# Patient Record
Sex: Female | Born: 1940 | Race: White | Hispanic: No | Marital: Married | Smoking: Never smoker
Health system: Southern US, Community
[De-identification: ages and names within clinical notes are randomized; demographics above are authoritative.]

## PROBLEM LIST (undated history)

## (undated) DIAGNOSIS — E119 Type 2 diabetes mellitus without complications: Secondary | ICD-10-CM

## (undated) DIAGNOSIS — C189 Malignant neoplasm of colon, unspecified: Secondary | ICD-10-CM

## (undated) DIAGNOSIS — B029 Zoster without complications: Secondary | ICD-10-CM

## (undated) DIAGNOSIS — J029 Acute pharyngitis, unspecified: Secondary | ICD-10-CM

## (undated) DIAGNOSIS — N390 Urinary tract infection, site not specified: Secondary | ICD-10-CM

## (undated) DIAGNOSIS — R110 Nausea: Secondary | ICD-10-CM

## (undated) DIAGNOSIS — Z95828 Presence of other vascular implants and grafts: Secondary | ICD-10-CM

## (undated) DIAGNOSIS — E079 Disorder of thyroid, unspecified: Secondary | ICD-10-CM

## (undated) DIAGNOSIS — I10 Essential (primary) hypertension: Secondary | ICD-10-CM

## (undated) HISTORY — DX: Urinary tract infection, site not specified: N39.0

## (undated) HISTORY — DX: Nausea: R11.0

## (undated) HISTORY — PX: PORT-A-CATH REMOVAL: SHX5289

## (undated) HISTORY — PX: COLECTOMY: SHX59

## (undated) HISTORY — PX: OTHER SURGICAL HISTORY: SHX169

## (undated) HISTORY — PX: APPENDECTOMY: SHX54

## (undated) HISTORY — DX: Type 2 diabetes mellitus without complications: E11.9

## (undated) HISTORY — DX: Essential (primary) hypertension: I10

## (undated) HISTORY — PX: RESECTION LIVER PARTIAL / TOTAL W/ RADIOFREQUENCY ABLATION: SUR1245

## (undated) HISTORY — DX: Acute pharyngitis, unspecified: J02.9

## (undated) HISTORY — DX: Zoster without complications: B02.9

## (undated) HISTORY — DX: Presence of other vascular implants and grafts: Z95.828

## (undated) HISTORY — DX: Disorder of thyroid, unspecified: E07.9

---

## 2004-01-22 ENCOUNTER — Ambulatory Visit: Payer: Self-pay | Admitting: Family Medicine

## 2004-04-28 ENCOUNTER — Ambulatory Visit: Payer: Self-pay | Admitting: Family Medicine

## 2004-11-03 ENCOUNTER — Ambulatory Visit: Payer: Self-pay | Admitting: Family Medicine

## 2005-01-02 HISTORY — PX: OOPHORECTOMY: SHX86

## 2005-02-09 ENCOUNTER — Ambulatory Visit: Payer: Self-pay | Admitting: Family Medicine

## 2005-02-17 ENCOUNTER — Ambulatory Visit: Payer: Self-pay | Admitting: Family Medicine

## 2005-05-16 ENCOUNTER — Ambulatory Visit: Payer: Self-pay | Admitting: Family Medicine

## 2005-07-06 ENCOUNTER — Ambulatory Visit: Payer: Self-pay | Admitting: Gastroenterology

## 2005-07-19 ENCOUNTER — Encounter: Payer: Self-pay | Admitting: Gastroenterology

## 2005-07-19 ENCOUNTER — Ambulatory Visit: Payer: Self-pay | Admitting: Gastroenterology

## 2005-08-04 ENCOUNTER — Encounter: Admission: RE | Admit: 2005-08-04 | Discharge: 2005-08-04 | Payer: Self-pay | Admitting: Surgery

## 2005-08-16 ENCOUNTER — Ambulatory Visit: Payer: Self-pay | Admitting: Family Medicine

## 2005-08-18 ENCOUNTER — Encounter: Admission: RE | Admit: 2005-08-18 | Discharge: 2005-08-18 | Payer: Self-pay | Admitting: Surgery

## 2005-09-13 ENCOUNTER — Encounter (INDEPENDENT_AMBULATORY_CARE_PROVIDER_SITE_OTHER): Payer: Self-pay | Admitting: Specialist

## 2005-09-13 ENCOUNTER — Inpatient Hospital Stay (HOSPITAL_COMMUNITY): Admission: RE | Admit: 2005-09-13 | Discharge: 2005-09-19 | Payer: Self-pay | Admitting: Surgery

## 2005-09-15 ENCOUNTER — Ambulatory Visit: Payer: Self-pay | Admitting: Hematology and Oncology

## 2005-09-26 ENCOUNTER — Ambulatory Visit: Payer: Self-pay | Admitting: Hematology and Oncology

## 2005-10-16 ENCOUNTER — Ambulatory Visit: Payer: Self-pay | Admitting: Family Medicine

## 2005-10-18 ENCOUNTER — Ambulatory Visit (HOSPITAL_COMMUNITY): Payer: Self-pay | Admitting: Oncology

## 2005-10-18 ENCOUNTER — Encounter (HOSPITAL_COMMUNITY): Admission: RE | Admit: 2005-10-18 | Discharge: 2005-11-17 | Payer: Self-pay | Admitting: Oncology

## 2005-10-18 ENCOUNTER — Encounter: Admission: RE | Admit: 2005-10-18 | Discharge: 2005-10-18 | Payer: Self-pay | Admitting: Oncology

## 2005-10-25 ENCOUNTER — Ambulatory Visit: Admission: RE | Admit: 2005-10-25 | Discharge: 2005-12-18 | Payer: Self-pay | Admitting: Radiation Oncology

## 2005-11-03 ENCOUNTER — Ambulatory Visit: Payer: Self-pay | Admitting: Family Medicine

## 2005-11-21 ENCOUNTER — Encounter (HOSPITAL_COMMUNITY): Admission: RE | Admit: 2005-11-21 | Discharge: 2005-12-21 | Payer: Self-pay | Admitting: Oncology

## 2005-11-21 ENCOUNTER — Ambulatory Visit: Payer: Self-pay | Admitting: Oncology

## 2005-12-07 ENCOUNTER — Ambulatory Visit (HOSPITAL_COMMUNITY): Payer: Self-pay | Admitting: Oncology

## 2006-01-16 ENCOUNTER — Ambulatory Visit: Payer: Self-pay | Admitting: Family Medicine

## 2006-01-17 ENCOUNTER — Encounter (HOSPITAL_COMMUNITY): Admission: RE | Admit: 2006-01-17 | Discharge: 2006-02-16 | Payer: Self-pay | Admitting: Oncology

## 2006-04-16 ENCOUNTER — Encounter (HOSPITAL_COMMUNITY): Admission: RE | Admit: 2006-04-16 | Discharge: 2006-05-16 | Payer: Self-pay | Admitting: Oncology

## 2006-04-16 ENCOUNTER — Ambulatory Visit (HOSPITAL_COMMUNITY): Payer: Self-pay | Admitting: Oncology

## 2006-04-24 ENCOUNTER — Ambulatory Visit: Payer: Self-pay | Admitting: Family Medicine

## 2006-10-15 ENCOUNTER — Ambulatory Visit: Payer: Self-pay | Admitting: Gastroenterology

## 2006-10-17 ENCOUNTER — Ambulatory Visit (HOSPITAL_COMMUNITY): Payer: Self-pay | Admitting: Oncology

## 2006-10-29 ENCOUNTER — Ambulatory Visit: Payer: Self-pay | Admitting: Gastroenterology

## 2007-04-24 ENCOUNTER — Ambulatory Visit (HOSPITAL_COMMUNITY): Payer: Self-pay | Admitting: Oncology

## 2007-08-07 ENCOUNTER — Encounter (HOSPITAL_COMMUNITY): Admission: RE | Admit: 2007-08-07 | Discharge: 2007-09-06 | Payer: Self-pay | Admitting: Oncology

## 2007-08-07 ENCOUNTER — Ambulatory Visit (HOSPITAL_COMMUNITY): Payer: Self-pay | Admitting: Oncology

## 2007-08-12 ENCOUNTER — Encounter: Payer: Self-pay | Admitting: Gastroenterology

## 2007-08-15 ENCOUNTER — Ambulatory Visit (HOSPITAL_COMMUNITY): Admission: RE | Admit: 2007-08-15 | Discharge: 2007-08-15 | Payer: Self-pay | Admitting: Oncology

## 2007-08-20 ENCOUNTER — Encounter: Payer: Self-pay | Admitting: Gastroenterology

## 2007-08-28 ENCOUNTER — Encounter: Admission: RE | Admit: 2007-08-28 | Discharge: 2007-08-28 | Payer: Self-pay | Admitting: Oncology

## 2007-09-27 ENCOUNTER — Ambulatory Visit (HOSPITAL_COMMUNITY): Admission: RE | Admit: 2007-09-27 | Discharge: 2007-09-28 | Payer: Self-pay | Admitting: Interventional Radiology

## 2007-10-08 ENCOUNTER — Ambulatory Visit (HOSPITAL_COMMUNITY): Payer: Self-pay | Admitting: Oncology

## 2007-10-08 ENCOUNTER — Encounter: Payer: Self-pay | Admitting: Gastroenterology

## 2007-10-08 ENCOUNTER — Encounter (HOSPITAL_COMMUNITY): Admission: RE | Admit: 2007-10-08 | Discharge: 2007-11-07 | Payer: Self-pay | Admitting: Oncology

## 2007-10-22 ENCOUNTER — Ambulatory Visit (HOSPITAL_COMMUNITY): Admission: RE | Admit: 2007-10-22 | Discharge: 2007-10-22 | Payer: Self-pay | Admitting: Surgery

## 2007-10-29 ENCOUNTER — Encounter: Admission: RE | Admit: 2007-10-29 | Discharge: 2007-10-29 | Payer: Self-pay | Admitting: Interventional Radiology

## 2007-11-05 ENCOUNTER — Encounter: Payer: Self-pay | Admitting: Gastroenterology

## 2007-11-13 ENCOUNTER — Encounter (HOSPITAL_COMMUNITY): Admission: RE | Admit: 2007-11-13 | Discharge: 2007-12-13 | Payer: Self-pay | Admitting: Oncology

## 2007-11-18 ENCOUNTER — Encounter: Payer: Self-pay | Admitting: Gastroenterology

## 2007-11-29 ENCOUNTER — Emergency Department (HOSPITAL_COMMUNITY): Admission: EM | Admit: 2007-11-29 | Discharge: 2007-11-29 | Payer: Self-pay | Admitting: Emergency Medicine

## 2007-12-02 ENCOUNTER — Ambulatory Visit (HOSPITAL_COMMUNITY): Payer: Self-pay | Admitting: Oncology

## 2007-12-04 ENCOUNTER — Inpatient Hospital Stay (HOSPITAL_COMMUNITY): Admission: EM | Admit: 2007-12-04 | Discharge: 2007-12-09 | Payer: Self-pay | Admitting: Emergency Medicine

## 2007-12-05 ENCOUNTER — Ambulatory Visit: Payer: Self-pay | Admitting: Gastroenterology

## 2007-12-06 ENCOUNTER — Ambulatory Visit: Payer: Self-pay | Admitting: Oncology

## 2007-12-07 ENCOUNTER — Ambulatory Visit: Payer: Self-pay | Admitting: Gastroenterology

## 2007-12-09 ENCOUNTER — Ambulatory Visit: Payer: Self-pay | Admitting: Internal Medicine

## 2007-12-20 ENCOUNTER — Encounter (HOSPITAL_COMMUNITY): Admission: RE | Admit: 2007-12-20 | Discharge: 2008-01-19 | Payer: Self-pay | Admitting: Oncology

## 2008-01-14 ENCOUNTER — Encounter: Payer: Self-pay | Admitting: Gastroenterology

## 2008-01-15 ENCOUNTER — Other Ambulatory Visit: Admission: RE | Admit: 2008-01-15 | Discharge: 2008-01-15 | Payer: Self-pay | Admitting: Oncology

## 2008-01-20 ENCOUNTER — Encounter (HOSPITAL_COMMUNITY): Admission: RE | Admit: 2008-01-20 | Discharge: 2008-02-19 | Payer: Self-pay | Admitting: Oncology

## 2008-01-28 ENCOUNTER — Ambulatory Visit (HOSPITAL_COMMUNITY): Payer: Self-pay | Admitting: Oncology

## 2008-02-18 ENCOUNTER — Encounter: Admission: RE | Admit: 2008-02-18 | Discharge: 2008-02-18 | Payer: Self-pay | Admitting: Interventional Radiology

## 2008-02-24 ENCOUNTER — Encounter: Payer: Self-pay | Admitting: Gastroenterology

## 2008-02-24 ENCOUNTER — Encounter (HOSPITAL_COMMUNITY): Admission: RE | Admit: 2008-02-24 | Discharge: 2008-03-25 | Payer: Self-pay | Admitting: Oncology

## 2008-03-24 ENCOUNTER — Encounter (HOSPITAL_COMMUNITY): Admission: RE | Admit: 2008-03-24 | Discharge: 2008-04-23 | Payer: Self-pay | Admitting: Oncology

## 2008-03-24 ENCOUNTER — Ambulatory Visit (HOSPITAL_COMMUNITY): Payer: Self-pay | Admitting: Oncology

## 2008-05-06 ENCOUNTER — Ambulatory Visit (HOSPITAL_COMMUNITY): Admission: RE | Admit: 2008-05-06 | Discharge: 2008-05-06 | Payer: Self-pay | Admitting: Oncology

## 2008-05-08 ENCOUNTER — Encounter: Payer: Self-pay | Admitting: Gastroenterology

## 2008-05-08 ENCOUNTER — Encounter (HOSPITAL_COMMUNITY): Admission: RE | Admit: 2008-05-08 | Discharge: 2008-06-07 | Payer: Self-pay | Admitting: Oncology

## 2008-05-19 ENCOUNTER — Encounter: Admission: RE | Admit: 2008-05-19 | Discharge: 2008-05-19 | Payer: Self-pay | Admitting: Interventional Radiology

## 2008-06-02 ENCOUNTER — Ambulatory Visit (HOSPITAL_COMMUNITY): Payer: Self-pay | Admitting: Oncology

## 2008-07-14 ENCOUNTER — Encounter (HOSPITAL_COMMUNITY): Admission: RE | Admit: 2008-07-14 | Discharge: 2008-08-13 | Payer: Self-pay | Admitting: Oncology

## 2008-08-25 ENCOUNTER — Ambulatory Visit (HOSPITAL_COMMUNITY): Payer: Self-pay | Admitting: Oncology

## 2008-08-25 ENCOUNTER — Encounter (HOSPITAL_COMMUNITY): Admission: RE | Admit: 2008-08-25 | Discharge: 2008-09-24 | Payer: Self-pay | Admitting: Oncology

## 2008-09-08 ENCOUNTER — Encounter: Admission: RE | Admit: 2008-09-08 | Discharge: 2008-09-08 | Payer: Self-pay | Admitting: Oncology

## 2008-09-09 ENCOUNTER — Encounter: Payer: Self-pay | Admitting: Gastroenterology

## 2008-09-23 ENCOUNTER — Encounter (INDEPENDENT_AMBULATORY_CARE_PROVIDER_SITE_OTHER): Payer: Self-pay | Admitting: *Deleted

## 2008-10-06 ENCOUNTER — Encounter (HOSPITAL_COMMUNITY): Admission: RE | Admit: 2008-10-06 | Discharge: 2008-11-05 | Payer: Self-pay | Admitting: Oncology

## 2008-11-17 ENCOUNTER — Encounter (HOSPITAL_COMMUNITY): Admission: RE | Admit: 2008-11-17 | Discharge: 2008-12-17 | Payer: Self-pay | Admitting: Oncology

## 2008-11-17 ENCOUNTER — Encounter (INDEPENDENT_AMBULATORY_CARE_PROVIDER_SITE_OTHER): Payer: Self-pay | Admitting: *Deleted

## 2008-11-17 ENCOUNTER — Ambulatory Visit (HOSPITAL_COMMUNITY): Payer: Self-pay | Admitting: Oncology

## 2008-12-11 ENCOUNTER — Encounter (INDEPENDENT_AMBULATORY_CARE_PROVIDER_SITE_OTHER): Payer: Self-pay | Admitting: *Deleted

## 2008-12-14 ENCOUNTER — Ambulatory Visit: Payer: Self-pay | Admitting: Gastroenterology

## 2008-12-14 ENCOUNTER — Encounter (INDEPENDENT_AMBULATORY_CARE_PROVIDER_SITE_OTHER): Payer: Self-pay | Admitting: *Deleted

## 2008-12-21 ENCOUNTER — Telehealth: Payer: Self-pay | Admitting: Gastroenterology

## 2008-12-24 ENCOUNTER — Telehealth: Payer: Self-pay | Admitting: Gastroenterology

## 2008-12-28 ENCOUNTER — Encounter (HOSPITAL_COMMUNITY): Admission: RE | Admit: 2008-12-28 | Discharge: 2009-01-27 | Payer: Self-pay | Admitting: Oncology

## 2008-12-30 ENCOUNTER — Ambulatory Visit: Payer: Self-pay | Admitting: Gastroenterology

## 2009-01-04 ENCOUNTER — Encounter: Payer: Self-pay | Admitting: Gastroenterology

## 2009-02-08 ENCOUNTER — Encounter (HOSPITAL_COMMUNITY): Admission: RE | Admit: 2009-02-08 | Discharge: 2009-03-10 | Payer: Self-pay | Admitting: Oncology

## 2009-02-08 ENCOUNTER — Ambulatory Visit (HOSPITAL_COMMUNITY): Payer: Self-pay | Admitting: Oncology

## 2009-03-03 ENCOUNTER — Encounter: Admission: RE | Admit: 2009-03-03 | Discharge: 2009-03-03 | Payer: Self-pay | Admitting: Oncology

## 2009-03-09 ENCOUNTER — Encounter (HOSPITAL_COMMUNITY): Admission: RE | Admit: 2009-03-09 | Discharge: 2009-04-08 | Payer: Self-pay | Admitting: Oncology

## 2009-03-17 ENCOUNTER — Encounter: Payer: Self-pay | Admitting: Gastroenterology

## 2009-04-28 ENCOUNTER — Encounter (HOSPITAL_COMMUNITY): Admission: RE | Admit: 2009-04-28 | Discharge: 2009-05-28 | Payer: Self-pay | Admitting: Oncology

## 2009-04-28 ENCOUNTER — Ambulatory Visit (HOSPITAL_COMMUNITY): Payer: Self-pay | Admitting: Oncology

## 2009-06-02 ENCOUNTER — Encounter (HOSPITAL_COMMUNITY): Admission: RE | Admit: 2009-06-02 | Discharge: 2009-07-02 | Payer: Self-pay | Admitting: Oncology

## 2009-07-14 ENCOUNTER — Encounter (HOSPITAL_COMMUNITY): Admission: RE | Admit: 2009-07-14 | Discharge: 2009-08-13 | Payer: Self-pay | Admitting: Oncology

## 2009-07-14 ENCOUNTER — Ambulatory Visit (HOSPITAL_COMMUNITY): Payer: Self-pay | Admitting: Oncology

## 2009-08-25 ENCOUNTER — Encounter (HOSPITAL_COMMUNITY): Admission: RE | Admit: 2009-08-25 | Discharge: 2009-09-24 | Payer: Self-pay | Admitting: Oncology

## 2009-09-29 ENCOUNTER — Encounter: Admission: RE | Admit: 2009-09-29 | Discharge: 2009-09-29 | Payer: Self-pay | Admitting: Oncology

## 2009-10-11 ENCOUNTER — Ambulatory Visit (HOSPITAL_COMMUNITY): Payer: Self-pay | Admitting: Oncology

## 2009-10-11 ENCOUNTER — Encounter (HOSPITAL_COMMUNITY)
Admission: RE | Admit: 2009-10-11 | Discharge: 2009-11-10 | Payer: Self-pay | Source: Home / Self Care | Admitting: Oncology

## 2009-10-12 ENCOUNTER — Encounter: Payer: Self-pay | Admitting: Gastroenterology

## 2009-11-24 ENCOUNTER — Encounter (HOSPITAL_COMMUNITY)
Admission: RE | Admit: 2009-11-24 | Discharge: 2009-12-24 | Payer: Self-pay | Source: Home / Self Care | Attending: Oncology | Admitting: Oncology

## 2009-12-02 IMAGING — CT CT GUIDANCE TISSUE ABLATION
2 of 18 series · 6 of 32 positions shown, 10 images · non-contrast
Comparison: none

EXAMINATION:

CT GUIDED RADIOFREQUENCY ABLATION OF RIGHT LIVER COLORECTAL
METASTASES (2)
CLINICAL DATA: 66-year-old female history of colon carcinoma,
surveillance CT and PET CT imaging demonstrated 2 hypermetabolic
right hepatic lesions consistent with metastases.

[Series 2: rfa 5.0 b40f st · axial · 0.83mm/px · z∈[-234,-174]mm · 3 of 24 slices shown, 7 images (1 of 2)]
[im 6/24  soft-tissue]
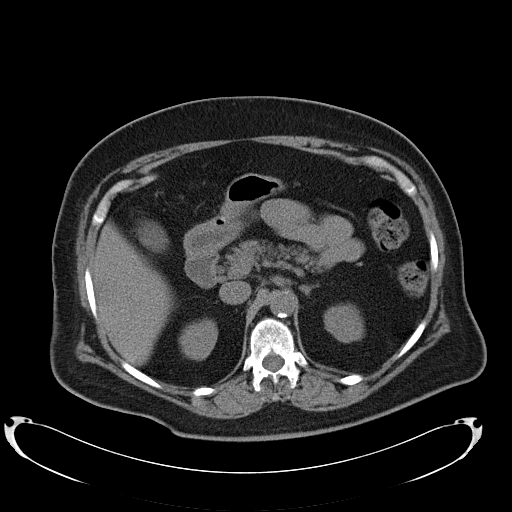
[im 6/24  lung]
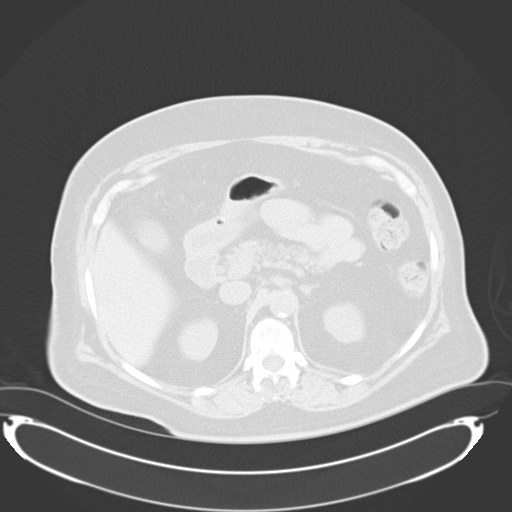
[im 6/24  bone]
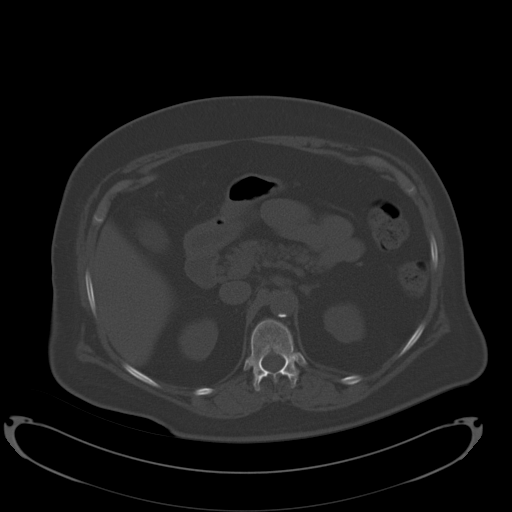
[im 12/24  soft-tissue]
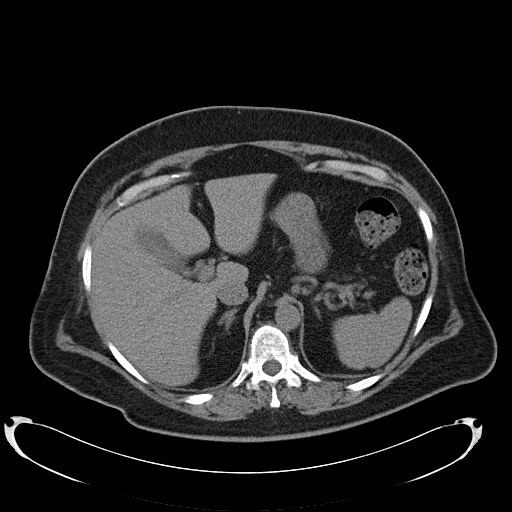
[im 12/24  lung]
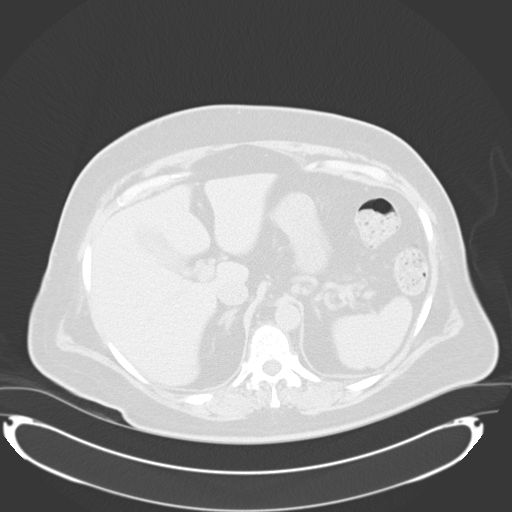
[im 18/24  soft-tissue]
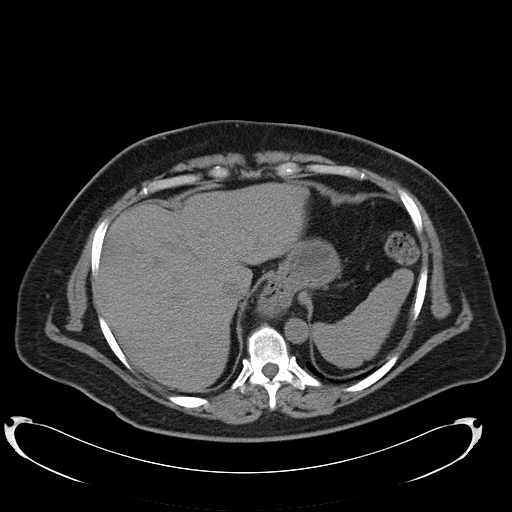
[im 18/24  lung]
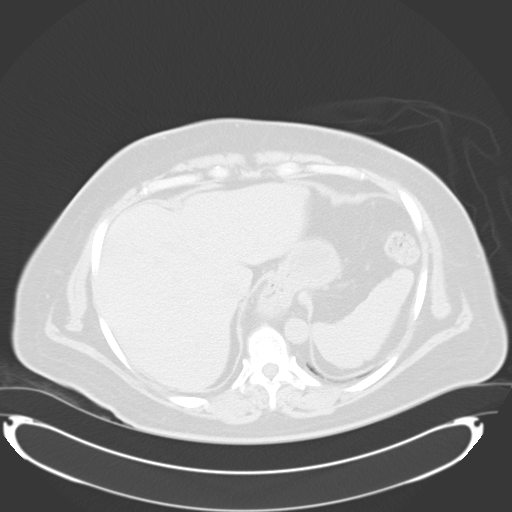

[Series 19: rfa 5.0 b40f st · axial · 0.83mm/px · z∈[-202,-132]mm · 3 of 28 slices shown (2 of 2)]
[im 7/28  soft-tissue]
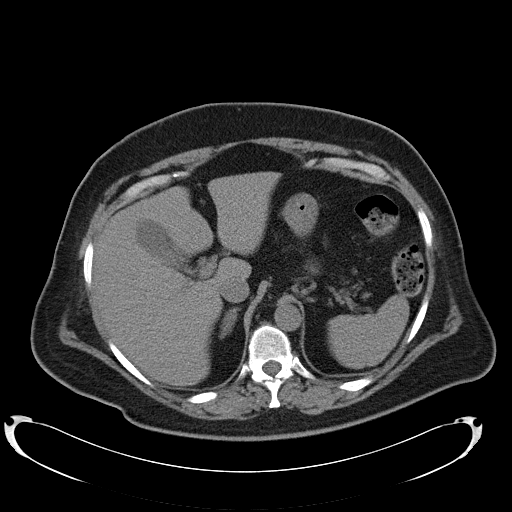
[im 14/28  soft-tissue]
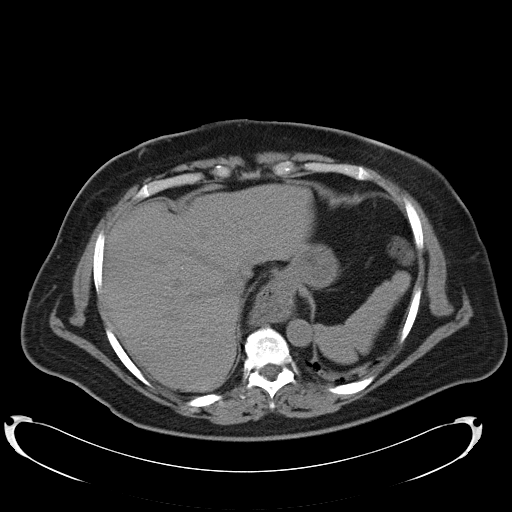
[im 21/28  soft-tissue]
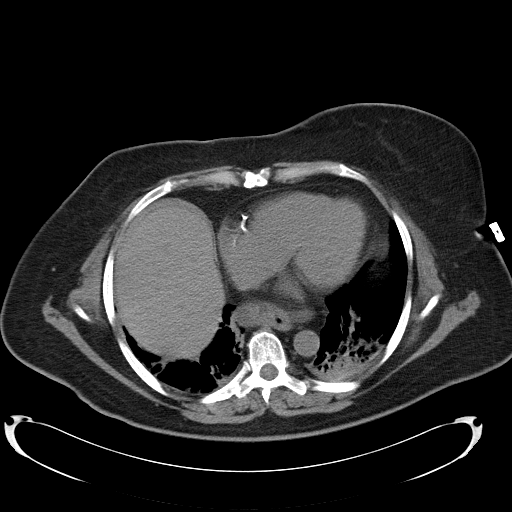

[6 of 32 positions shown; findings below may reference images not displayed]

Date:09/27/2007 [DATE]

Radiologist:BONDOC.Guerra, M.D.

Medications:General anesthesia

Guidance:CT

Complications:No immediate

PROCEDURE/FINDINGS:

Informed consent was obtained from the patient following
explanation of the procedure, risks, benefits and alternatives.
The patient understands, agrees and consents for the procedure.
All questions were addressed.  A time out was performed.

After induction of general anesthesia, the patient was positioned
supine.  Noncontrast CT localization was performed through the
liver.  The two right hepatic hypodense liver lesions were
identified and correlated with the previous CT from 08/14/2088.

From an anterior intercostal approach, 2 20 cm length [employer] RFA
probes with 3 cm treatment tips were advanced across normal liver
parenchyma into the two right liver lesions.  Needle positions
where confirmed across the 2 right liver lesions with serial CT
exams during needle advancement.

Once the needles were confirmed across the two lesions,
radiofrequency ablation treatment was performed simultaneously of
the 2 lesions.  Initially a 16-minute cycle was performed followed
by an additional 4 minute cycle.  After the 20-minute treatment,
both probes demonstrated appropriate temperature measurements,
greater than 60 degrees Celsius.  The needles were retracted in the
cautery mode  and removed.  No immediate complication.  The patient
tolerated procedure well.

She was extubated and stable for recovery in the PACU.
IMPRESSION: CT guided radiofrequency ablation of 2 right liver
colorectal metastases.

## 2010-01-05 ENCOUNTER — Encounter (HOSPITAL_COMMUNITY)
Admission: RE | Admit: 2010-01-05 | Discharge: 2010-02-01 | Payer: Self-pay | Source: Home / Self Care | Attending: Oncology | Admitting: Oncology

## 2010-01-05 ENCOUNTER — Ambulatory Visit (HOSPITAL_COMMUNITY)
Admission: RE | Admit: 2010-01-05 | Discharge: 2010-02-01 | Payer: Self-pay | Source: Home / Self Care | Attending: Oncology | Admitting: Oncology

## 2010-01-05 LAB — CBC
HCT: 38.3 % (ref 36.0–46.0)
Hemoglobin: 12.9 g/dL (ref 12.0–15.0)
MCH: 31.5 pg (ref 26.0–34.0)
MCHC: 33.7 g/dL (ref 30.0–36.0)
MCV: 93.4 fL (ref 78.0–100.0)
Platelets: 313 10*3/uL (ref 150–400)
RBC: 4.1 MIL/uL (ref 3.87–5.11)
RDW: 12.8 % (ref 11.5–15.5)
WBC: 8.4 10*3/uL (ref 4.0–10.5)

## 2010-01-05 LAB — BASIC METABOLIC PANEL
BUN: 7 mg/dL (ref 6–23)
CO2: 28 mEq/L (ref 19–32)
Calcium: 9.6 mg/dL (ref 8.4–10.5)
Chloride: 102 mEq/L (ref 96–112)
Creatinine, Ser: 0.75 mg/dL (ref 0.4–1.2)
GFR calc Af Amer: >60 mL/min (ref >60)
GFR calc non Af Amer: >60 mL/min (ref >60)
Glucose, Bld: 108 mg/dL — ABNORMAL HIGH (ref 70–99)
Potassium: 4.2 mEq/L (ref 3.5–5.1)
Sodium: 139 mEq/L (ref 135–145)

## 2010-01-05 LAB — CEA: CEA: 4.3 ng/mL (ref 0.0–5.0)

## 2010-01-07 ENCOUNTER — Encounter: Payer: Self-pay | Admitting: Gastroenterology

## 2010-01-23 ENCOUNTER — Encounter (HOSPITAL_COMMUNITY): Payer: Self-pay | Admitting: Oncology

## 2010-01-26 ENCOUNTER — Encounter
Admission: RE | Admit: 2010-01-26 | Discharge: 2010-01-26 | Payer: Self-pay | Source: Home / Self Care | Attending: Oncology | Admitting: Oncology

## 2010-02-01 NOTE — Letter (Signed)
Summary: [facility]  [facility]   Imported By: Sherian Rein 11/08/2009 07:42:22  _____________________________________________________________________  External Attachment:    Type:   Image     Comment:   External Document

## 2010-02-01 NOTE — Letter (Signed)
Summary: Patient Notice- Colon Biospy Results  [facility]  [address] [place], [place] [address]   Phone: [phone]  Fax: [phone]        January 04, 2009 MRN: [id number]    [facility] [place] [address] [place], [place]  [address]    Dear Ms. Alcock,  I am pleased to inform you that the biopsies taken during your recent colonoscopy did not show any evidence of cancer upon pathologic examination.FOLLOWUP EXAM IN 3 YEARS DEPENDING ON CLINICAL STATUS.  Additional information/recommendations:  X__No further action is needed at this time.  Please follow-up with      your primary care physician for your other healthcare needs.  __Please call [phone] to schedule a return visit to review      your condition.  __Continue with the treatment plan as outlined on the day of your      exam.  __You should have a repeat colonoscopy examination for this problem           in _ years.  Please call us if you are having persistent problems or have questions about your condition that have not been fully answered at this time.  Sincerely,  Mardella Layman MD [facility]   This letter has been electronically signed by your physician.  Appended Document: Patient Notice- Colon Biospy Results Letter mailed 1.5.11

## 2010-02-01 NOTE — Letter (Signed)
Summary: [facility]  [facility]   Imported By: Lester Hamilton 04/21/2009 11:14:39  _____________________________________________________________________  External Attachment:    Type:   Image     Comment:   External Document

## 2010-02-09 NOTE — Letter (Signed)
Summary: [facility]  [facility]   Imported By: Sherian Rein 02/04/2010 11:27:09  _____________________________________________________________________  External Attachment:    Type:   Image     Comment:   External Document

## 2010-02-16 ENCOUNTER — Encounter (HOSPITAL_COMMUNITY): Payer: Medicare Other | Attending: Oncology

## 2010-02-16 ENCOUNTER — Other Ambulatory Visit (HOSPITAL_COMMUNITY): Payer: PRIVATE HEALTH INSURANCE

## 2010-02-16 ENCOUNTER — Other Ambulatory Visit (HOSPITAL_COMMUNITY): Payer: Self-pay | Admitting: Oncology

## 2010-02-16 ENCOUNTER — Encounter (HOSPITAL_COMMUNITY): Payer: Medicare Other

## 2010-02-16 DIAGNOSIS — Z85048 Personal history of other malignant neoplasm of rectum, rectosigmoid junction, and anus: Secondary | ICD-10-CM | POA: Insufficient documentation

## 2010-02-16 DIAGNOSIS — C189 Malignant neoplasm of colon, unspecified: Secondary | ICD-10-CM

## 2010-02-16 DIAGNOSIS — Z452 Encounter for adjustment and management of vascular access device: Secondary | ICD-10-CM

## 2010-03-18 LAB — CBC
HCT: 37.2 % (ref 36.0–46.0)
Hemoglobin: 12.6 g/dL (ref 12.0–15.0)
MCHC: 33.9 g/dL (ref 30.0–36.0)
MCV: 95.9 fL (ref 78.0–100.0)
RDW: 13.3 % (ref 11.5–15.5)

## 2010-03-18 LAB — COMPREHENSIVE METABOLIC PANEL
ALT: 19 U/L (ref 0–35)
AST: 19 U/L (ref 0–37)
CO2: 28 mEq/L (ref 19–32)
Calcium: 9.2 mg/dL (ref 8.4–10.5)
Chloride: 104 mEq/L (ref 96–112)
Creatinine, Ser: 0.77 mg/dL (ref 0.4–1.2)
GFR calc Af Amer: >60 mL/min (ref >60)
GFR calc non Af Amer: >60 mL/min (ref >60)
Glucose, Bld: 110 mg/dL — ABNORMAL HIGH (ref 70–99)
Total Bilirubin: 0.7 mg/dL (ref 0.3–1.2)

## 2010-03-18 LAB — DIFFERENTIAL
Basophils Absolute: 0 10*3/uL (ref 0.0–0.1)
Eosinophils Absolute: 0.2 10*3/uL (ref 0.0–0.7)
Eosinophils Relative: 2 % (ref 0–5)
Lymphs Abs: 2.2 10*3/uL (ref 0.7–4.0)
Neutrophils Relative %: 65 % (ref 43–77)

## 2010-03-20 LAB — CEA: CEA: 3.3 ng/mL (ref 0.0–5.0)

## 2010-03-21 LAB — CEA: CEA: 3.7 ng/mL (ref 0.0–5.0)

## 2010-03-23 LAB — BUN: BUN: 13 mg/dL (ref 6–23)

## 2010-03-23 LAB — CREATININE, SERUM
Creatinine, Ser: 0.73 mg/dL (ref 0.4–1.2)
GFR calc non Af Amer: >60 mL/min (ref >60)

## 2010-03-23 LAB — HEMOGLOBIN A1C: Mean Plasma Glucose: 194 mg/dL

## 2010-03-27 LAB — CEA: CEA: 4.2 ng/mL (ref 0.0–5.0)

## 2010-03-30 ENCOUNTER — Other Ambulatory Visit (HOSPITAL_COMMUNITY): Payer: PRIVATE HEALTH INSURANCE

## 2010-03-30 ENCOUNTER — Encounter (HOSPITAL_COMMUNITY): Payer: Medicare Other | Attending: Oncology

## 2010-03-30 DIAGNOSIS — C189 Malignant neoplasm of colon, unspecified: Secondary | ICD-10-CM

## 2010-03-30 DIAGNOSIS — Z85048 Personal history of other malignant neoplasm of rectum, rectosigmoid junction, and anus: Secondary | ICD-10-CM | POA: Insufficient documentation

## 2010-04-04 ENCOUNTER — Encounter (HOSPITAL_COMMUNITY): Payer: Medicare Other | Attending: Oncology | Admitting: Oncology

## 2010-04-04 DIAGNOSIS — Z85048 Personal history of other malignant neoplasm of rectum, rectosigmoid junction, and anus: Secondary | ICD-10-CM | POA: Insufficient documentation

## 2010-04-04 DIAGNOSIS — C19 Malignant neoplasm of rectosigmoid junction: Secondary | ICD-10-CM

## 2010-04-04 DIAGNOSIS — C801 Malignant (primary) neoplasm, unspecified: Secondary | ICD-10-CM

## 2010-04-04 LAB — GLUCOSE, CAPILLARY: Glucose-Capillary: 118 mg/dL — ABNORMAL HIGH (ref 70–99)

## 2010-04-06 LAB — CEA: CEA: 2.2 ng/mL (ref 0.0–5.0)

## 2010-04-08 LAB — BASIC METABOLIC PANEL
Calcium: 9.2 mg/dL (ref 8.4–10.5)
GFR calc Af Amer: >60 mL/min (ref >60)
GFR calc non Af Amer: >60 mL/min (ref >60)
Glucose, Bld: 284 mg/dL — ABNORMAL HIGH (ref 70–99)
Potassium: 3.7 mEq/L (ref 3.5–5.1)
Sodium: 137 mEq/L (ref 135–145)

## 2010-04-08 LAB — CREATININE, SERUM
Creatinine, Ser: 0.78 mg/dL (ref 0.4–1.2)
GFR calc Af Amer: >60 mL/min (ref >60)

## 2010-04-09 LAB — CEA: CEA: 2.8 ng/mL (ref 0.0–5.0)

## 2010-04-10 LAB — CEA: CEA: 2.3 ng/mL (ref 0.0–5.0)

## 2010-04-11 LAB — CEA: CEA: 2.5 ng/mL (ref 0.0–5.0)

## 2010-04-11 LAB — HEMOGLOBIN A1C: Hgb A1c MFr Bld: 7.6 % — ABNORMAL HIGH (ref 4.6–6.1)

## 2010-04-13 LAB — DIFFERENTIAL
Basophils Absolute: 0.1 10*3/uL (ref 0.0–0.1)
Basophils Relative: 1 % (ref 0–1)
Lymphocytes Relative: 28 % (ref 12–46)
Monocytes Relative: 10 % (ref 3–12)
Neutro Abs: 4.1 10*3/uL (ref 1.7–7.7)
Neutrophils Relative %: 60 % (ref 43–77)

## 2010-04-13 LAB — CBC
HCT: 35.9 % — ABNORMAL LOW (ref 36.0–46.0)
MCHC: 34.5 g/dL (ref 30.0–36.0)
MCV: 103.3 fL — ABNORMAL HIGH (ref 78.0–100.0)
Platelets: 360 10*3/uL (ref 150–400)
RDW: 17.1 % — ABNORMAL HIGH (ref 11.5–15.5)

## 2010-04-13 LAB — COMPREHENSIVE METABOLIC PANEL
Albumin: 3.2 g/dL — ABNORMAL LOW (ref 3.5–5.2)
Alkaline Phosphatase: 53 U/L (ref 39–117)
BUN: 4 mg/dL — ABNORMAL LOW (ref 6–23)
Calcium: 9 mg/dL (ref 8.4–10.5)
Creatinine, Ser: 0.63 mg/dL (ref 0.4–1.2)
Glucose, Bld: 168 mg/dL — ABNORMAL HIGH (ref 70–99)
Potassium: 3.5 mEq/L (ref 3.5–5.1)
Total Protein: 6.1 g/dL (ref 6.0–8.3)

## 2010-04-13 LAB — CEA: CEA: 3.3 ng/mL (ref 0.0–5.0)

## 2010-04-14 LAB — CEA: CEA: 3.4 ng/mL (ref 0.0–5.0)

## 2010-04-14 LAB — COMPREHENSIVE METABOLIC PANEL
BUN: 4 mg/dL — ABNORMAL LOW (ref 6–23)
Calcium: 8.9 mg/dL (ref 8.4–10.5)
Glucose, Bld: 215 mg/dL — ABNORMAL HIGH (ref 70–99)
Total Protein: 6 g/dL (ref 6.0–8.3)

## 2010-04-14 LAB — CBC
HCT: 34.2 % — ABNORMAL LOW (ref 36.0–46.0)
Hemoglobin: 11.6 g/dL — ABNORMAL LOW (ref 12.0–15.0)
MCHC: 33.8 g/dL (ref 30.0–36.0)
MCV: 102 fL — ABNORMAL HIGH (ref 78.0–100.0)
Platelets: 225 10*3/uL (ref 150–400)
RBC: 3.36 MIL/uL — ABNORMAL LOW (ref 3.87–5.11)
RDW: 17.4 % — ABNORMAL HIGH (ref 11.5–15.5)
WBC: 4.7 10*3/uL (ref 4.0–10.5)

## 2010-04-14 LAB — URINE CULTURE: Special Requests: NEGATIVE

## 2010-04-14 LAB — URINALYSIS, DIPSTICK ONLY
Hgb urine dipstick: NEGATIVE
Nitrite: NEGATIVE
Specific Gravity, Urine: >1.03 — ABNORMAL HIGH (ref 1.005–1.030)
Urobilinogen, UA: 0.2 mg/dL (ref 0.0–1.0)
pH: 5.5 (ref 5.0–8.0)

## 2010-04-14 LAB — DIFFERENTIAL
Basophils Absolute: 0 10*3/uL (ref 0.0–0.1)
Eosinophils Absolute: 0 10*3/uL (ref 0.0–0.7)
Lymphocytes Relative: 30 % (ref 12–46)
Lymphs Abs: 1.4 10*3/uL (ref 0.7–4.0)
Neutrophils Relative %: 55 % (ref 43–77)

## 2010-04-14 LAB — POTASSIUM: Potassium: 3.3 meq/L — ABNORMAL LOW (ref 3.5–5.1)

## 2010-04-18 LAB — COMPREHENSIVE METABOLIC PANEL
ALT: 13 U/L (ref 0–35)
AST: 21 U/L (ref 0–37)
Albumin: 2.6 g/dL — ABNORMAL LOW (ref 3.5–5.2)
Albumin: 2.7 g/dL — ABNORMAL LOW (ref 3.5–5.2)
Alkaline Phosphatase: 69 U/L (ref 39–117)
BUN: 8 mg/dL (ref 6–23)
CO2: 25 mEq/L (ref 19–32)
Calcium: 8.8 mg/dL (ref 8.4–10.5)
Chloride: 98 mEq/L (ref 96–112)
Creatinine, Ser: 0.81 mg/dL (ref 0.4–1.2)
Creatinine, Ser: 0.76 mg/dL (ref 0.4–1.2)
GFR calc Af Amer: >60 mL/min (ref >60)
GFR calc Af Amer: >60 mL/min (ref >60)
GFR calc non Af Amer: >60 mL/min (ref >60)
Potassium: 3.3 mEq/L — ABNORMAL LOW (ref 3.5–5.1)
Total Bilirubin: 0.7 mg/dL (ref 0.3–1.2)
Total Protein: 5.9 g/dL — ABNORMAL LOW (ref 6.0–8.3)

## 2010-04-18 LAB — CBC
Hemoglobin: 12.3 g/dL (ref 12.0–15.0)
MCHC: 32.9 g/dL (ref 30.0–36.0)
MCHC: 33.3 g/dL (ref 30.0–36.0)
MCV: 100.4 fL — ABNORMAL HIGH (ref 78.0–100.0)
Platelets: 365 10*3/uL (ref 150–400)
RBC: 3.7 MIL/uL — ABNORMAL LOW (ref 3.87–5.11)
WBC: 11.2 10*3/uL — ABNORMAL HIGH (ref 4.0–10.5)

## 2010-04-18 LAB — URINE MICROSCOPIC-ADD ON

## 2010-04-18 LAB — URINE CULTURE
Colony Count: 35000
Special Requests: NEGATIVE
Special Requests: POSITIVE
Special Requests: POSITIVE

## 2010-04-18 LAB — DIFFERENTIAL
Basophils Absolute: 0 10*3/uL (ref 0.0–0.1)
Basophils Absolute: 0 10*3/uL (ref 0.0–0.1)
Basophils Relative: 0 % (ref 0–1)
Eosinophils Absolute: 0 10*3/uL (ref 0.0–0.7)
Lymphocytes Relative: 30 % (ref 12–46)
Lymphs Abs: 2.1 10*3/uL (ref 0.7–4.0)
Lymphs Abs: 2 10*3/uL (ref 0.7–4.0)
Monocytes Absolute: 1.6 10*3/uL — ABNORMAL HIGH (ref 0.1–1.0)
Monocytes Absolute: 0.7 10*3/uL (ref 0.1–1.0)
Monocytes Relative: 11 % (ref 3–12)
Neutro Abs: 7.5 10*3/uL (ref 1.7–7.7)
Neutro Abs: 3.6 10*3/uL (ref 1.7–7.7)

## 2010-04-18 LAB — URINALYSIS, ROUTINE W REFLEX MICROSCOPIC
Glucose, UA: NEGATIVE mg/dL
Glucose, UA: 100 mg/dL — AB
Glucose, UA: 250 mg/dL — AB
Hgb urine dipstick: NEGATIVE
Hgb urine dipstick: NEGATIVE
Ketones, ur: 40 mg/dL — AB
Leukocytes, UA: NEGATIVE
Leukocytes, UA: NEGATIVE
Protein, ur: 100 mg/dL — AB
Protein, ur: 30 mg/dL — AB
Specific Gravity, Urine: 1.015 (ref 1.005–1.030)
Specific Gravity, Urine: 1.025 (ref 1.005–1.030)
Urobilinogen, UA: 0.2 mg/dL (ref 0.0–1.0)
Urobilinogen, UA: 1 mg/dL (ref 0.0–1.0)
pH: 5.5 (ref 5.0–8.0)
pH: 8 (ref 5.0–8.0)

## 2010-04-18 LAB — CEA
CEA: 4.3 ng/mL (ref 0.0–5.0)
CEA: 3.4 ng/mL (ref 0.0–5.0)

## 2010-04-19 LAB — DIFFERENTIAL
Eosinophils Relative: 1 % (ref 0–5)
Lymphocytes Relative: 28 % (ref 12–46)
Lymphs Abs: 1.4 10*3/uL (ref 0.7–4.0)
Monocytes Absolute: 0.8 10*3/uL (ref 0.1–1.0)

## 2010-04-19 LAB — CBC
HCT: 33.7 % — ABNORMAL LOW (ref 36.0–46.0)
MCV: 101.4 fL — ABNORMAL HIGH (ref 78.0–100.0)
Platelets: 244 10*3/uL (ref 150–400)
RBC: 3.32 MIL/uL — ABNORMAL LOW (ref 3.87–5.11)
WBC: 5.2 10*3/uL (ref 4.0–10.5)

## 2010-04-27 ENCOUNTER — Encounter (HOSPITAL_COMMUNITY): Payer: Medicare Other

## 2010-04-27 DIAGNOSIS — C189 Malignant neoplasm of colon, unspecified: Secondary | ICD-10-CM

## 2010-05-11 ENCOUNTER — Encounter (HOSPITAL_COMMUNITY): Payer: Medicare Other | Attending: Oncology

## 2010-05-11 ENCOUNTER — Other Ambulatory Visit (HOSPITAL_COMMUNITY): Payer: PRIVATE HEALTH INSURANCE

## 2010-05-11 DIAGNOSIS — Z85048 Personal history of other malignant neoplasm of rectum, rectosigmoid junction, and anus: Secondary | ICD-10-CM | POA: Insufficient documentation

## 2010-05-11 DIAGNOSIS — C785 Secondary malignant neoplasm of large intestine and rectum: Secondary | ICD-10-CM

## 2010-05-11 DIAGNOSIS — Z452 Encounter for adjustment and management of vascular access device: Secondary | ICD-10-CM

## 2010-05-17 NOTE — Discharge Summary (Signed)
[name], [name]                 ACCOUNT NO.:  [id number]   MEDICAL RECORD NO.:  [id number]          PATIENT TYPE:  INP   LOCATION:  A322                          FACILITY:  [facility]   PHYSICIAN:  Osvaldo Shipper, MD     DATE OF BIRTH:  08-Apr-1940   DATE OF ADMISSION:  12/04/2007  DATE OF DISCHARGE:  12/07/2009LH                               DISCHARGE SUMMARY   PATIENT'S PMD:  Dr. Lysbeth Galas.  She is also seen by Dr. Mariel Sleet who is  her oncologist.   The patient was seen in consultation during this admission by Dr. Kassie Mends, gastroenterologist.   DISCHARGE DIAGNOSES:  1. Enteritis likely secondary to Xeloda.  2. Urinary tract infection with Escherichia coli.  3. Diabetes type II.  4. Hypertension.   Please see the H and P dictated by Dr. Sherle Poe for details regarding  patient's presenting illness.   BRIEF HOSPITAL COURSE:  Briefly, this is a 70 year old Caucasian female  who has a past medical history of colon cancer with metastases to the  liver that was identified just recently.  The patient received Xeloda as  her chemotherapeutic agent.  The patient presented after experiencing  nausea, vomiting, diarrhea, and abdominal pain for about a week.  The  patient underwent a CAT scan of her abdomen and pelvis which revealed  evidence for diffuse small bowel inflammatory changes involving most  severely the terminal ileal loops.  Right hepatic low density lesions  were also noted.  Stable postoperative appearance of the pelvis was  seen.  The patient was seen by gastroenterologist as well as Dr.  Mariel Sleet, and they felt that this was secondary to Xeloda.  Patient was  started empirically on Cipro and Flagyl.  The Flagyl was subsequently  discontinued.  Cipro was continued because the patient also had a  urinary tract infection.  The patient symptomatically has improved from  her enteritis.  She has only minimal nausea but no emesis.  Severe  diarrhea has also resolved.   The  patient was also hyponatremic on admission which is also improved.  She was very dehydrated which is also improved.   Stool studies are all negative.   Diabetes and hypertension were also managed during this admission.   Urine cultures have grown E. coli sensitive to cipro. Patient has  completed 6 days of antibiotics.   Considering her improvement in the last 2 days, the patient is stable  for discharge at this time.   PHYSICAL EXAMINATION:  On the day of discharge, the patient is feeling  well.  Denies any complaints except that her lower extremities are  swollen up.  VITAL SIGNS:  Show temperature 97.7, heart rate 74, respiratory rate 18,  blood pressure 140/79, saturations 94% on room air.  LUNGS:  Clear to auscultation bilaterally.  CARDIOVASCULAR:  S1, S2 normal, regular, no murmurs appreciated.  ABDOMEN:  Soft, benign, nontender, nondistended.  Bowel sounds are  present, no masses or organomegaly is appreciated.  Lower extremities do indeed show 1 to 2+ pitting edema bilaterally.  No  erythema is noted.  No calf  tenderness is present.   The patient also tells me that she has not had any emesis in the last  more than 24 hours, and her diarrhea is also quite minimal.  I have  explained to her that her edema is likely secondary to all the fluid  that she has received here.  She also has a low albumin which also  probably causes third spacing of her fluids.  I have explained to her  that when she starts ambulating on a consistent basis, the edema should  improve.   DISCHARGE MEDICATIONS:  1. Glucotrol 5 mg daily.  2. Actos 10 mg daily.  3. Cozaar 50 mg daily.  4. Percocet 5/325 every 4-6 hours as needed for pain.  I have      prescribed her 30 tablets.  5. Zofran 4 mg every 4-6 hours for nausea, 20 tablets have been      prescribed.   FOLLOW-UP:  The patient to call Dr. Thornton Papas office on December 8th for followup.  F/u with her PMD as needed.   DIET:  Modified  carbohydrate.   PHYSICAL ACTIVITY:  As before.   The patient has been explained that if her symptoms recur, if she starts  feeling nauseous, has uncontrolled vomiting, and severe diarrhea, she  needs to seek attention immediately.   Total time on this discharge encounter:  35 minutes.      Osvaldo Shipper, MD  Electronically Signed     GK/MEDQ  D:  12/09/2007  T:  12/09/2007  Job:  [id number]   cc:   Ladona Horns. Mariel Sleet, MD  Fax: [phone]   Delaney Meigs, M.D.  Fax: [phone]

## 2010-05-17 NOTE — H&P (Signed)
[name], [name]                 ACCOUNT NO.:  [id number]   MEDICAL RECORD NO.:  [id number]          PATIENT TYPE:  INP   LOCATION:  A322                          FACILITY:  [facility]   PHYSICIAN:  Margaretmary Dys, M.D.DATE OF BIRTH:  [birth year]   DATE OF ADMISSION:  12/04/2007  DATE OF DISCHARGE:  LH                              HISTORY & PHYSICAL   ADMISSION DIAGNOSES:  1. Nausea and vomiting.  2. Acute gastroenteritis, probably infectious versus inflammatory.  3. Mucositis, probably related to a chemotherapy.  4. History of metastatic colon cancer to the liver on chemotherapy.      Last chemotherapy treatment was 3 weeks ago.  Also on Xeloda.  5. Diabetes mellitus, poorly controlled.  6. Acute abdominal pain possibly related to gastroenteritis as      described above.  7. Urinary tract infection   CHIEF COMPLAINT:  Nausea or vomiting and diarrhea of about 4 days  duration.   HISTORY OF PRESENT ILLNESS:  [name] is a 70 year old female who  presented to the emergency room with complaints of nausea and vomiting.  The patient is on her chemotherapy.  Her third round of chemotherapy is  scheduled for today, but she could not have it because of some nausea  and vomiting.  This has been going for several days.  She actually  received  intravenous fluids for hydration by Dr. Mariel Sleet.   It was felt the patient did not need to be admitted at this time.  The  patient went home but she has been unable to keep anything down.  She  has felt very dehydrated and felt lightheaded and weak and lethargic.  She also describes her urine color as being very dark.    The patient presented to the emergency room where she was found to be  somewhat dehydrated.  She also has diffuse abdominal pain.  A CT scan is  suggestive of diffuse enteritis with inflammation worse in the terminal  ileum.  Infections cannot be excluded.  Inflammatory causes such as  mucositis as also a possibility.  The  patient is now being admitted for  IV fluid hydration and also antibiotic treatments.   REVIEW OF SYSTEMS:  The patient denies any weight loss.  She denies any  hemoptysis or blood in her sputum.  She says most of her vomitus has  been bilious in nature.  She describes her current abdominal pain as a 6  out of 10, mostly diffuse and more like a soreness and occasionally  colicky, especially when she needs to go to the bathroom.  She has no  melena stools.   PAST MEDICAL HISTORY:  1. Hypertension.  2. Diabetes for about 15 to 20 years.  3. History of colon cancer diagnosed in 2007,  status post colectomy      with end-to-end anastomosis.  Status post elevation in CEA level      (carcinoembryonic antigen level) with initiation of chemotherapy.  4. History of dyslipidemia.   MEDICATIONS:  Xeloda, Ativan, Actos, Cozaar.   ALLERGIES:  NO KNOWN DRUG ALLERGIES.   FAMILY HISTORY:  Positive for cancer; also hypertension and diabetes.   SOCIAL HISTORY:  The patient is married, has four children.  She is a  lifelong nonsmoker and does not drink alcohol.  No history of illicit  drug use.  The patient is [place] to work in the [employer] for 35  years before she retired.   PAST SURGICAL HISTORY:  1. Status post appendectomy.  2. Status post colectomy.  3. Status post oophorectomy   PHYSICAL EXAMINATION:  GENERAL:  The patient was conscious, alert,  comfortable, not in acute distress, pleasant, was well-oriented in time,  place and person.  VITAL SIGNS:  Her blood pressure on arrival to the emergency room was  116/81 with a pulse of 92, respirations 20, temperature 97.7 degrees  Fahrenheit,  oxygen saturation was 94% on room air.  HEENT:  Normocephalic, atraumatic.  Oral mucosa was moist with no  exudates.  NECK:  Supple.  No JVD or lymphadenopathy.  LUNGS:  Clear clinically with good air entry bilaterally.  No crackles  or wheezing or rhonchi was heard.  HEART:  S1 and S2 regular.   No S3 or S4, gallops or rubs.  ABDOMEN:  Soft, not distended.  There was no guarding,  no rigidity.  The patient had good although somewhat hyperactive bowel sounds.  There  was no rebound tenderness.  EXTREMITIES:  No pitting pedal edema.  No calf induration or tenderness  was noted.  CENTRAL NERVOUS SYSTEM:  Grossly intact with no focal neurological  deficits.   LABORATORY AND DIAGNOSTIC DATA:  White blood cell count 6, hemoglobin of  12.5, hematocrit 36.9, platelet count was 273,000 with no left shift.  Sodium 130, potassium 3.9, chloride was 97, CO2 is 26, glucose 229, BUN  of 16, creatinine was 1, AST of 24, ALT of 30.  Albumin was 2.4.  Calcium is 8.4.  Urinalysis: Urine was hazy.  There were ketones  present, large amount of blood.  Proteins were present.  Nitrite was  positive.  Microscopy shows 3-7 wbc's and many bacteria with few  epithelial cells.   RADIOLOGIC DATA:  A CT scan of the abdomen and pelvis shows the diffuse  small bowel inflammatory changes with only the proximal jejunum spared.  Terminal ileum loops were involved, more likely infectious process.  Right hepatic low density lesions were also noted with increased hepatic  steatosis data.  Otherwise rest of the CT scan was unremarkable.  No  indication for any acute surgical intervention at this time.   ASSESSMENT/PLAN:  This is a 70 year old female with history of colon  cancer diagnosed to 2007, status post colectomy, who has a recent  elevation in her CEA (carcinoembryonic antigen) prompting initiation of  chemotherapy.   PLAN:  1. The patient be admitted to the medical floor.  2. The patient be kept n.p.o. except for ice chips and some      medications.  3. The patient will be initiated on Cipro and Flagyl for possible      bacterial gastroenteritis.  The Cipro should also cover potential      urinary tract infection in this lady.  4. IV fluids for hydration.  5. Will put her Zofran and Phenergan p.r.n.  as needed for nausea and      vomiting.  6. GI prophylaxis will be with Protonix.  DVT prophylaxis with      Lovenox.  7. Hold antihypertensive medications and diabetic medications.  8. Will put her on a sliding scale insulin for  now.  This is because      of the lack of availability of her oral status.  9. I will also request gastroenterology consult in the morning due to      the extensive abnormal CT scan of the abdomen.  10.I will also notify Dr. Mariel Sleet and request a consult as she      remains on his active treatment and care.   I have explained the above plan to the patient who verbalized full  understanding and the necessity for hospitalization.      Margaretmary Dys, M.D.  Electronically Signed     AM/MEDQ  D:  12/04/2007  T:  12/05/2007  Job:  [id number]

## 2010-05-17 NOTE — Consult Note (Signed)
NAME:  [name], [name]                 ACCOUNT NO.:  [id number]   MEDICAL RECORD NO.:  [id number]          PATIENT TYPE:  INP   LOCATION:  A322                          FACILITY:  [facility]   PHYSICIAN:  Kassie Mends, M.D.      DATE OF BIRTH:  [birth year]   DATE OF CONSULTATION:  DATE OF DISCHARGE:                                 CONSULTATION   HISTORY OF PRESENT ILLNESS:  The patient is a 70 year old Caucasian  female with history of metastatic liver cancer, who presented with 1-  week history of nausea, vomiting, diarrhea, and abdominal pain.  The  patient was diagnosed with colorectal cancer back in 2007.  She  underwent low-anterior resection with primary anastomosis of  postoperative XRT and Xeloda treatment.  She was doing very well.  Over  the past several months, it was noted that her CPK levels has been  rising.  She underwent a PET scan in August, which revealed these  hepatic masses, likely metastases, and minimal left lateral rectosigmoid  colonic wall thickening with mild FDG uptake, felt to be postoperative  changes and less likely recurrent.  She underwent a CT-guided  radiofrequency ablation of the two hepatic lesions in September 2005.  She has been undergoing chemotherapy along with her Xeloda.  She had  completed 2 cycles and is due for her third cycle this week.  She states  the day before her cycle, she started having nausea, vomiting, and  diarrhea.  It was unlikely if she has had this in the past with  chemotherapy.  Her symptoms have been persistent, occurring most days  with too numerous to count episodes of emesis and diarrhea.  She denies  any hematemesis or rectal bleeding.  Her stools have been completely  green.  She has had some diffuse abdominal pain for most of the life.  She states it was too bad to go, but would eventually go away.  She  denies any fever, chills, ill contacts, or recent antibiotic use.  She  states she has not been able to keep any p.o. down  for about 9 days.  She received fluid last Friday per the ER, Monday with Dr. Thornton Papas  office, but symptoms persisted, therefore she made it for further  management.  She had a CT of her abdomen and pelvis with IV contrast  only, which revealed extensive wall thickening of the terminal ileum,  loops extending down to the IC valve, but not involving the cecum or the  IC base.  The small bowel inflammatory changes were quite extensive, but  __________.  She also had rectal wall thickening possibly due to prior  radiation therapy.  Hepatic lesions __________ postablation therapy.   On December 02, 2007, ANC __________.  Initial 3800, down to 2800 today.  White blood cell count 4400, albumin 3.2, sodium 131.  Other LFTs  normal.  __________ platelets 257,000.   MEDICATIONS AT HOME:  1. Glucotrol 5 mg daily.  2. Actos 10 mg daily.  3. Cozaar 50 mg daily.  4. Norvasc.  5. Xeloda.  She states she takes  14 days __________ chemotherapy.   ALLERGIES:  No known drug allergies.   PAST MEDICAL HISTORY:  Diabetes mellitus, hypertension, dyslipidemia,  history of colorectal cancer status post oophorectomy, appendectomy,  history of __________.   FAMILY HISTORY:  Son also with colorectal cancer.   SOCIAL HISTORY:  She is married, lives with children.  No tobacco or  alcohol abuse.  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   REVIEW OF SYSTEMS:  GI:  See HPI.  CONSTITUTIONAL:  No weight loss.  CARDIOPULMONARY:  Denies chest pain, shortness of breath, palpitations,  or cough.  GENITOURINARY:  No dysuria or hematuria.   PHYSICAL EXAMINATION:  VITAL SIGNS:  Temperature 98.9, pulse 92,  respirations 18, blood pressure 130/68, height 65 inches, weight  __________ kilogram.  GENERAL:  Pleasant, obese Caucasian female in no acute distress.  She is  accompanied by her daughter.  SKIN:  Warm and dry.  HEENT:  Sclerae nonicteric.  Oropharyngeal mucosa moist and pink.  No   lesions, erythema, or exudate.  CHEST:  Lungs are clear to auscultation.  CARDIAC:  Regular rate and rhythm. __________.  ABDOMEN:  Mildly distended.  Possibly distended bowel sounds.  Abdomen  is soft.  She has mild right-sided tenderness to deep palpation.  I do  not appreciate any hepatosplenomegaly or bruit.   LABORATORY DATA:  As noted above.  In addition to that, potassium is 3.6  and BUN is 14, creatinine 1.3, total bilirubin 6.5, AST 20, and ALT 26.   IMPRESSION:  [name] is a very pleasant 70 year old lady with history  of metastatic colon cancer as well as liver (status post radiofrequency  ablation), on chemotherapy, who now presents with persistent nausea,  vomiting, and diarrhea with significant involvement of small bowel with  diffuse thickening except for the proximal jejunum.  Suspect __________  infectious nephritis.  Notably her ANC was low __________ remains in the  differential, but currently __________.  There is involvement of the  cecum.   RECOMMENDATIONS:  1. Stool studies.  2. Bowel rest.  3. Continue trial with Cipro, but may need to add more broad-spectrum      coverage.  We will defer this over to Dr. Cira Servant.  4. Supportive measures.      Tana Coast, P.A.      Kassie Mends, M.D.  Electronically Signed    LL/MEDQ  D:  12/05/2007  T:  12/05/2007  Job:  [id number]   cc:   Ladona Horns. Mariel Sleet, MD  Fax: [phone]   Kassie Mends, M.D.  [address]  [place] , [place] [address]   P Team

## 2010-05-17 NOTE — Op Note (Signed)
[name], [name]                 ACCOUNT NO.:  [id number]   MEDICAL RECORD NO.:  [id number]          PATIENT TYPE:  AMB   LOCATION:  DAY                          FACILITY:  [facility]   PHYSICIAN:  [name], M.D.DATE OF BIRTH:  [birth year]   DATE OF PROCEDURE:  10/22/2007  DATE OF DISCHARGE:                               OPERATIVE REPORT   OFFICE MEDICAL RECORD NUMBER [id number].   PREOPERATIVE DIAGNOSIS:  Metastatic colon cancer.   POSTOPERATIVE DIAGNOSIS:  Metastatic colon cancer.   OPERATION:  Port-A-Cath placement for chemotherapy.   ANESTHESIA:  MAC.   MEDICAL HISTORY:  This is a 66-year lady recently found to have some  liver metastases from a colon cancer which was operated on a few years  ago.  For chemotherapy she needs IV access.   DESCRIPTION OF PROCEDURE:  The patient was seen in the holding area and  had no further questions.  We confirmed Port-A-Cath placement with the  needle in place for postoperative chemotherapy as the plans.   The patient was taken to the operating room and after satisfactory IV  sedation, the lower neck and upper chest were prepped and draped as a  sterile field.  The timeout was done.   The left infraclavicular area was infiltrated with 1% Xylocaine plain.  The subclavian vein was entered on initial attempt and the guidewire  threaded easily and into superior vena cava/right atrial area using  fluoroscopy.   Additional local was infiltrated over the anterior chest wall a little  bit medially.  As she has fairly large breasts, I want to have an area  where there was some chest wall to anchor the port to.  A pocket was  fashioned.   Port-A-Cath tubing was pulled from the subcutaneous pocket into the area  of the guidewire.  The guidewire tract was then dilated once, so the  dilator peel-away sheath and the Port-A-Cath tubing threaded easily  through with the tip being positioned in the right atrium and the peel-  away sheath removed.   I backed the port out under using fluoroscopy  until it appeared to be in the distal SVC and was about 19-20 cm from  the skin.  It aspirated and flushed easily.   The reservoir was flushed, attached, and a locking mechanism engaged.  This aspirated and flushed easily.  It was secured to the deep tissues  with some 2-0 Prolene.  It appeared to be in good position.  We used the  [employer] port with an 8-French catheter.   I did a final fluoroscopy and things appeared to be okay with no kinks  and the tip in a good position.   The incision was then closed with 3-0 Vicryl, 4-0 Monocryl subcuticular,  and Steri-Strips.  I accessed the port and aspirated, flushed with  dilute heparin and then concentrated heparin, and left the needle in  place for her chemotherapy tomorrow.  Sterile dressings were applied.  The patient tolerated the procedure well.  There were no complications.      [name], M.D.  Electronically  Signed     CJS/MEDQ  D:  10/22/2007  T:  10/22/2007  Job:  [id number]   cc:   Delaney Meigs, M.D.  Fax: [phone]   Ladona Horns. Mariel Sleet, MD  Fax: [phone]

## 2010-05-20 NOTE — Op Note (Signed)
NAMESHEBRA, [name]                 ACCOUNT NO.:  [id number]   MEDICAL RECORD NO.:  [id number]          PATIENT TYPE:  INP   LOCATION:  0004                         FACILITY:  [facility]   PHYSICIAN:  [name], M.D.DATE OF BIRTH:  1940-02-07   DATE OF PROCEDURE:  09/13/2005  DATE OF DISCHARGE:                                 OPERATIVE REPORT   OFFICE MEDICAL RECORD NUMBER:  [id number].   PREOPERATIVE DIAGNOSIS:  Carcinoma rectosigmoid.   POSTOPERATIVE DIAGNOSES:  1. Carcinoma rectosigmoid.  2. Left ovarian fibroma.   OPERATION:  Low anterior resection of the colon with primary anastomosis;  bilateral oophorectomies.   SURGEON:  Dr. Jamey Ripa.   ASSISTANT:  Dr. Zachery Dakins   ANESTHESIA:  General.   CLINICAL HISTORY:  [name] is a 64-year lady recently found to have a  rectosigmoid colon cancer on screening colonoscopy.  Preoperative workup  showed a question of a fibroid or an ovarian mass of uncertain etiology.  There was also question of some thickening of her uterus, uterine lining,  but she was totally asymptomatic.  We elected to proceed to surgical  resection with evaluation of her ovaries at the time of surgery.   DESCRIPTION OF PROCEDURE:  The patient was seen in the holding area and she  had no further questions.  She was then taken to the operating room and,  after satisfactory general endotracheal anesthesia had been obtained, Foley  catheter was placed.  She was placed in partial lithotomy stirrups so that  we could do an EEA anastomosis if necessary.  The abdomen was then prepped  and draped.  The time-out occurred.   A midline incision was made from umbilicus to symphysis and peritoneal  cavity entered.  Exploration showed a fibrous mass in the left ovary.  There  is, what appeared to be, a carcinoma at the rectosigmoid junction.  The  right ovary looked normal.  The uterus was contracted and small and appeared  normal.  No other abdominal abnormalities were  noted.   The wound protector and self-retaining retractors were placed.  I began by  removing the left ovary and sending this for frozen.  This was done by  freeing up the blood supply and then coming across that with the LigaSure  device.  This subsequently returned as a benign fibroma of left ovary.   The sigmoid colon was then mobilized.  Both ureters were identified.  We had  a large redundant sigmoid and the mass was fairly low down, so I picked an  area of the sigmoid, mid to proximal sigmoid, to divide it, and it was  divided with a GIA.  I then divided the mesentery from that point down  straight to the base of the mesentery, using primarily the LigaSure, but  when we got to the large vessels at the base, these were double tied 2-0  silks.  I opened the peritoneum on either side, going down into the pelvis,  and mobilized up the rectum.  Again, we paid close attention to location of  the ureters to be sure they were  not injured, and again divided the  mesentery with the LigaSure until we had freed up the mesentary and  mobilized the proximal rectum up into the pelvis so that we had plenty of  room beyond the tumor to do an anastomosis.  Once I had all was cleaned off,  we prepared to do the anastomosis.   At this point, I stopped and, because this appeared to be malignant, went  ahead and did a right oophorectomy, again using the LigaSure.  We did  include the tube.   Once I had the colon mobilized, I put some stay sutures on the rectum and  divided the rectum between clamps.  This appeared to be very healthy and  viable.   I then did a Baker type anastomosis.  We opened the side of the proximal  sigmoid and did a hand sewn one layer anastomosis with 3-0 silk, putting all  suture knots on the inside.  We had a widely patent anastomosis.  I did put  a few Lembert in just to reinforce this, but we appeared to have a very nice  staple line with no tension at all, and it lay  nicely in the pelvis and  there was no evidence of bleeding or ischemia or other problems.   We then changed instruments and gloves.  I irrigated to make sure everything  was dry.  I tacked the sigmoid mesentery down to the posterior peritoneum so  that there was no spot for internal hernia to develop.  We then applied  about 5 mL of Tisseel onto the anastomosis just for extra reinforcement.   The bowel was replaced into the abdomen in its normal anatomic position.  The omentum was pulled down to lie under the wound as it was closed.  The  incision was closed with a running #1 PDS on the posterior sheath and #1  Novofil on the anterior sheath.  The wound was irrigated and skin closed  with staples.   The patient tolerated procedure well.  There no operative complications.  All counts were correct.      [name], M.D.  Electronically Signed     CJS/MEDQ  D:  09/13/2005  T:  09/13/2005  Job:  [id number]   cc:   Delaney Meigs, M.D.  Fax: [phone]   Dr. Sheryn Bison

## 2010-05-20 NOTE — Consult Note (Signed)
[name], [name]                 ACCOUNT NO.:  [id number]   MEDICAL RECORD NO.:  [id number]          PATIENT TYPE:  INP   LOCATION:  1429                         FACILITY:  [facility]   PHYSICIAN:  [name], M.D.DATE OF BIRTH:  Mar 20, 1940   DATE OF CONSULTATION:  09/15/2005  DATE OF DISCHARGE:                                   CONSULTATION   REFERRING PHYSICIAN:  Currie [name], M.D.   REASON FOR CONSULTATION:  Colorectal adenocarcinoma.   FINDINGS:  The patient is a 70 year old woman who on routine colonoscopy on  July 19, 2005 was found to have a rectal soft tissue mass which was biopsy  proven to be a tubulovillous adenoma with high-grade dysplasia.  Descending  colon polyps were also demonstrated and were tubular adenomas.  At time of  presentation, she denied nausea, vomiting, abdominal pain, diarrhea or  rectal bleeding. Preop evaluation which had included a CT scan of the  abdomen and pelvis on August 04, 2005 demonstrated a 4.7 cm left adnexal  lesion but no evidence of pelvic or abdominal metastatic disease.  A  transabdominal ultrasound demonstrated a 4.4 cm hyperechoic mass within the  left ovary.  On September 13, 2005 the patient underwent a low anterior  resection with bilateral salpingo-oophorectomy.  Grossly the tumor was  demonstrated at the juncture of the rectosigmoid junction.  Pathology  demonstrated a 5 cm moderately differentiated adenocarcinoma invading into  the muscularis propria into the pericolonic adipose tissue.  There was no  evidence of lymphovascular invasion and none of the 11 lymph nodes sampled  had evidence of metastatic disease.  Surgical margins had no evidence of  tumor.  The left ovary demonstrated a fibroma with no evidence of  malignancy.   PAST MEDICAL HISTORY:  Hypothyroidism, hypertension, type 2 diabetes.   ALLERGIES:  NONE.   MEDICATIONS WHILE IN-HOUSE:  Lovenox, started morphine sulfate, Synthroid 50  mg daily, Cozaar  50 mg daily.  On hold are Zetia, Lipitor and metformin.   SOCIAL HISTORY:  The patient is married with four children.  She lives in  [place].  She denies a history of alcohol or tobacco use.   FAMILY HISTORY:  Negative for oncologic malignancy.   PHYSICAL EXAMINATION:  GENERAL:  The patient is pleasant, alert and oriented  x3.  VITALS:  Pulse 91, blood pressure 145/70, temperature 99, respirations 22.  HEENT:  Head is atraumatic, normocephalic.  Sclerae are anicteric.  Mouth  moist, no thrush.  NECK:  Supple.  CHEST:  Clear to percussion and auscultation.  CARDIOVASCULAR:  Exam reveals first and second heart sounds present , no  added sounds or murmurs.  ABDOMEN:  Demonstrates a covered surgical incision which was tender to  palpate.  It is soft with decreased bowel sounds.  EXTREMITIES:  No edema.  Pulses present and symmetrical.   LABORATORIES:  CBC September 11, 2005 white cell count 8.4, hemoglobin 12.6,  hematocrit 37.5, platelets 259.  Sodium 144, potassium 4.3, chloride 108,  CO2 28, BUN 9, creatinine 1, glucose 104, total bilirubin 0.7, alkaline  phosphatase 53, AST 20,  ALT 18, calcium 9.5.   IMPRESSION AND PLAN:  [name] is a 70 year old woman who is status post  anterior resection for T3 N0 moderately differentiated adenocarcinoma with  none of the 11 lymph node sampled with evidence of tumor.  At this juncture  I have to find out the exact tumor location with Dr. Jamey Ripa.  Specifically,  if the tumor was located above or below the peritoneal reflection, as this  will have an impact on the patient's treatment options.  I told the patient  that if the tumor was indeed below the peritoneal reflection, I would  suggest concurrent chemotherapy with radiation as there was significant  benefit in terms of local control and survival advantage.  If it was the  opposite I would probably have her participate in one of our clinical trials  where she would be randomized to  observation versus therapy.  We will  arrange for the patient oncology outpateint followup appointment to discuss  treatment options further.  Thank you for this referral.      [name], M.D.  Electronically Signed     LIO/MEDQ  D:  09/15/2005  T:  09/15/2005  Job:  [id number]   cc:   [name], M.D.  [address]  [place]  [place] [address]   [name], M.D.  Fax: [phone]   Anita Rea. Jarold Motto, MD, FACG, FACP  [address]  [place]  [place] [address]

## 2010-05-20 NOTE — Discharge Summary (Signed)
NAMESTEPHINIE, [name]                 ACCOUNT NO.:  [id number]   MEDICAL RECORD NO.:  [id number]          PATIENT TYPE:  INP   LOCATION:  1429                         FACILITY:  [facility]   PHYSICIAN:  [name], M.D.DATE OF BIRTH:  Jan 22, 1940   DATE OF ADMISSION:  09/13/2005  DATE OF DISCHARGE:  09/19/2005                                 DISCHARGE SUMMARY   OFFICE MEDICAL RECORD NUMBER [id number].   FINAL DIAGNOSES:  1. Carcinoma of the rectosigmoid (T3, N0).  2. Diabetes, non Insulin-dependent.  3. Hypercholesterolemia.  4. Hypothyroid.  5. Hypertension.  6. Left ovarian fibroma.   CLINICAL HISTORY:  Ms. Liebert is a 70 year old lady recently diagnosed with an  adenocarcinoma that was at 15 cm from the anal verge. She was admitted for  surgical resection.   HOSPITAL COURSE:  The patient was admitted and taken to the operating room.  A low anterior resection with primary anastomosis was done. A large but  benign appearing mass of the left ovary was noted and both ovaries were  taken at the time of surgery as well as the tubes.   The patient tolerated the procedure well. Her diabetes was managed with  sliding scale. She was kept n.p.o. for a couple days with liquids and then  gradually advanced her diet. Her wound appeared clean the whole time. As she  gradually improved she was able to ambulate more, began to have bowel  movements and by the sixth postoperative day was tolerating a solid diet,  having minimal amounts of pain controlled with oral pain medications. Her  bowels were working and she was able to void without problems. She was ready  for discharge and felt able to go home.   Pathology report showed this to be a T3, N0 cancer with 11 nodes negative  for metastatic disease. The ovary turned out to be a benign fibroma. Chest x-  ray had been normal.  EKG shows normal sinus rhythm with minimal voltage  criteria for LVH. No other laboratory data is available on the chart at  this  point in time. The patient is to be discharged with alternate staples being  removed prior to discharge.   She is to be followed up in my office in 1 week. She will resume her usual  medications. She is given a prescription for Vicodin for pain.   The patient was seen by Dr. Dalene Carrow for consideration of possible  chemotherapy. Further determinations are to be made following discharge. If  she  enters some protocol she will be followed by Dr. Dalene Carrow in [place].  If  not, Dr. Mariel Sleet in [place] will follow her since she lives in [place].      [name], M.D.  Electronically Signed     CJS/MEDQ  D:  09/19/2005  T:  09/19/2005  Job:  [id number]   cc:   Delaney Meigs, M.D.  Fax: [phone]   [name], MD, FACG, FACP  [address]  [place]  [place] [address]   Vicente Serene I. Odogwu, M.D.  Fax: [phone]   Ladona Horns.  Mariel Sleet, MD  Fax: [phone]

## 2010-05-25 ENCOUNTER — Other Ambulatory Visit (HOSPITAL_COMMUNITY): Payer: Self-pay | Admitting: Oncology

## 2010-05-25 ENCOUNTER — Encounter (HOSPITAL_COMMUNITY): Payer: Medicare Other

## 2010-05-25 DIAGNOSIS — C189 Malignant neoplasm of colon, unspecified: Secondary | ICD-10-CM

## 2010-05-27 ENCOUNTER — Other Ambulatory Visit (HOSPITAL_COMMUNITY): Payer: Self-pay | Admitting: Oncology

## 2010-05-27 DIAGNOSIS — C189 Malignant neoplasm of colon, unspecified: Secondary | ICD-10-CM

## 2010-06-03 ENCOUNTER — Encounter (HOSPITAL_COMMUNITY): Payer: Self-pay

## 2010-06-03 ENCOUNTER — Encounter (HOSPITAL_COMMUNITY)
Admission: RE | Admit: 2010-06-03 | Discharge: 2010-06-03 | Disposition: A | Discharge status: Home / Self Care | Payer: Medicare Other | Source: Ambulatory Visit | Attending: Oncology | Admitting: Oncology

## 2010-06-03 ENCOUNTER — Other Ambulatory Visit (HOSPITAL_COMMUNITY): Payer: Medicare Other

## 2010-06-03 DIAGNOSIS — C189 Malignant neoplasm of colon, unspecified: Secondary | ICD-10-CM | POA: Insufficient documentation

## 2010-06-03 HISTORY — DX: Malignant neoplasm of colon, unspecified: C18.9

## 2010-06-03 LAB — GLUCOSE, CAPILLARY: Glucose-Capillary: 162 mg/dL — ABNORMAL HIGH (ref 70–99)

## 2010-06-03 MED ORDER — FLUDEOXYGLUCOSE F - 18 (FDG) INJECTION
16.4000 | Freq: Once | INTRAVENOUS | Status: AC | PRN
  Administered 2010-06-03: 16.4 via INTRAVENOUS

## 2010-06-07 ENCOUNTER — Inpatient Hospital Stay (HOSPITAL_COMMUNITY): Admission: RE | Admit: 2010-06-07 | Payer: Medicare Other | Source: Ambulatory Visit

## 2010-06-10 ENCOUNTER — Encounter (HOSPITAL_COMMUNITY): Payer: Medicare Other | Attending: Oncology

## 2010-06-10 ENCOUNTER — Ambulatory Visit (HOSPITAL_COMMUNITY): Payer: Medicare Other

## 2010-06-10 DIAGNOSIS — Z85048 Personal history of other malignant neoplasm of rectum, rectosigmoid junction, and anus: Secondary | ICD-10-CM | POA: Insufficient documentation

## 2010-06-10 DIAGNOSIS — C189 Malignant neoplasm of colon, unspecified: Secondary | ICD-10-CM

## 2010-06-15 ENCOUNTER — Other Ambulatory Visit (HOSPITAL_COMMUNITY): Payer: Self-pay | Admitting: Oncology

## 2010-06-15 DIAGNOSIS — C787 Secondary malignant neoplasm of liver and intrahepatic bile duct: Secondary | ICD-10-CM

## 2010-06-22 ENCOUNTER — Encounter (HOSPITAL_COMMUNITY): Payer: Medicare Other

## 2010-06-22 ENCOUNTER — Other Ambulatory Visit (HOSPITAL_COMMUNITY): Payer: Self-pay | Admitting: Oncology

## 2010-06-22 DIAGNOSIS — Z452 Encounter for adjustment and management of vascular access device: Secondary | ICD-10-CM

## 2010-06-22 DIAGNOSIS — C189 Malignant neoplasm of colon, unspecified: Secondary | ICD-10-CM

## 2010-06-22 LAB — CEA: CEA: 7.9 ng/mL — ABNORMAL HIGH (ref 0.0–5.0)

## 2010-06-27 ENCOUNTER — Other Ambulatory Visit (HOSPITAL_COMMUNITY): Payer: Self-pay | Admitting: Oncology

## 2010-06-27 ENCOUNTER — Ambulatory Visit (HOSPITAL_COMMUNITY)
Admission: RE | Admit: 2010-06-27 | Discharge: 2010-06-27 | Disposition: A | Discharge status: Home / Self Care | Payer: Medicare Other | Source: Ambulatory Visit | Attending: Oncology | Admitting: Oncology

## 2010-06-27 ENCOUNTER — Ambulatory Visit (HOSPITAL_COMMUNITY): Payer: Medicare Other

## 2010-06-27 ENCOUNTER — Other Ambulatory Visit: Payer: Self-pay | Admitting: Interventional Radiology

## 2010-06-27 ENCOUNTER — Ambulatory Visit (HOSPITAL_COMMUNITY): Payer: PRIVATE HEALTH INSURANCE

## 2010-06-27 ENCOUNTER — Ambulatory Visit (HOSPITAL_COMMUNITY): Payer: PRIVATE HEALTH INSURANCE | Admitting: Oncology

## 2010-06-27 DIAGNOSIS — C189 Malignant neoplasm of colon, unspecified: Secondary | ICD-10-CM | POA: Insufficient documentation

## 2010-06-27 DIAGNOSIS — C787 Secondary malignant neoplasm of liver and intrahepatic bile duct: Secondary | ICD-10-CM

## 2010-06-27 DIAGNOSIS — E119 Type 2 diabetes mellitus without complications: Secondary | ICD-10-CM | POA: Insufficient documentation

## 2010-06-27 LAB — CBC
HCT: 38 % (ref 36.0–46.0)
MCHC: 32.4 g/dL (ref 30.0–36.0)
MCV: 93.8 fL (ref 78.0–100.0)
Platelets: 281 10*3/uL (ref 150–400)
RDW: 12.9 % (ref 11.5–15.5)

## 2010-06-27 LAB — GLUCOSE, CAPILLARY: Glucose-Capillary: 104 mg/dL — ABNORMAL HIGH (ref 70–99)

## 2010-07-05 ENCOUNTER — Ambulatory Visit (HOSPITAL_COMMUNITY)
Admission: RE | Admit: 2010-07-05 | Discharge: 2010-07-05 | Disposition: A | Discharge status: Home / Self Care | Payer: Medicare Other | Source: Ambulatory Visit | Attending: Oncology | Admitting: Oncology

## 2010-07-05 ENCOUNTER — Other Ambulatory Visit (HOSPITAL_COMMUNITY): Payer: Self-pay | Admitting: Oncology

## 2010-07-05 ENCOUNTER — Encounter (HOSPITAL_COMMUNITY): Payer: Medicare Other | Attending: Oncology | Admitting: Oncology

## 2010-07-05 DIAGNOSIS — C787 Secondary malignant neoplasm of liver and intrahepatic bile duct: Secondary | ICD-10-CM

## 2010-07-05 DIAGNOSIS — Z09 Encounter for follow-up examination after completed treatment for conditions other than malignant neoplasm: Secondary | ICD-10-CM

## 2010-07-05 DIAGNOSIS — C189 Malignant neoplasm of colon, unspecified: Secondary | ICD-10-CM

## 2010-07-05 DIAGNOSIS — T82898A Other specified complication of vascular prosthetic devices, implants and grafts, initial encounter: Secondary | ICD-10-CM | POA: Insufficient documentation

## 2010-07-05 DIAGNOSIS — Z85048 Personal history of other malignant neoplasm of rectum, rectosigmoid junction, and anus: Secondary | ICD-10-CM | POA: Insufficient documentation

## 2010-07-13 ENCOUNTER — Inpatient Hospital Stay (HOSPITAL_COMMUNITY): Payer: Medicare Other

## 2010-07-15 ENCOUNTER — Encounter (HOSPITAL_COMMUNITY): Payer: Medicare Other

## 2010-07-20 ENCOUNTER — Other Ambulatory Visit (HOSPITAL_COMMUNITY): Payer: Self-pay | Admitting: Interventional Radiology

## 2010-07-20 ENCOUNTER — Ambulatory Visit
Admission: RE | Admit: 2010-07-20 | Discharge: 2010-07-20 | Disposition: A | Discharge status: Home / Self Care | Payer: Medicare Other | Source: Ambulatory Visit | Attending: Oncology | Admitting: Oncology

## 2010-07-20 DIAGNOSIS — C189 Malignant neoplasm of colon, unspecified: Secondary | ICD-10-CM

## 2010-07-20 DIAGNOSIS — C787 Secondary malignant neoplasm of liver and intrahepatic bile duct: Secondary | ICD-10-CM

## 2010-07-20 NOTE — Progress Notes (Signed)
Pt states that appetite is good.  Weight stable.  Denies pain.  Denies nausea, vomiting or diarrhea.    To see Dr Miles Costain re:  RFA of Liver.  Also, revise PAC (cancer center unable to flush)

## 2010-07-27 ENCOUNTER — Encounter (HOSPITAL_COMMUNITY): Payer: Medicare Other

## 2010-07-27 ENCOUNTER — Other Ambulatory Visit: Payer: Self-pay | Admitting: Interventional Radiology

## 2010-07-27 ENCOUNTER — Other Ambulatory Visit (HOSPITAL_COMMUNITY): Payer: Self-pay | Admitting: Interventional Radiology

## 2010-07-27 ENCOUNTER — Ambulatory Visit (HOSPITAL_COMMUNITY)
Admission: RE | Admit: 2010-07-27 | Discharge: 2010-07-27 | Disposition: A | Discharge status: Home / Self Care | Payer: Medicare Other | Source: Ambulatory Visit | Attending: Interventional Radiology | Admitting: Interventional Radiology

## 2010-07-27 DIAGNOSIS — Z79899 Other long term (current) drug therapy: Secondary | ICD-10-CM | POA: Insufficient documentation

## 2010-07-27 DIAGNOSIS — T82598A Other mechanical complication of other cardiac and vascular devices and implants, initial encounter: Secondary | ICD-10-CM | POA: Insufficient documentation

## 2010-07-27 DIAGNOSIS — Z01810 Encounter for preprocedural cardiovascular examination: Secondary | ICD-10-CM | POA: Insufficient documentation

## 2010-07-27 DIAGNOSIS — C787 Secondary malignant neoplasm of liver and intrahepatic bile duct: Secondary | ICD-10-CM | POA: Insufficient documentation

## 2010-07-27 DIAGNOSIS — C189 Malignant neoplasm of colon, unspecified: Secondary | ICD-10-CM | POA: Insufficient documentation

## 2010-07-27 DIAGNOSIS — I10 Essential (primary) hypertension: Secondary | ICD-10-CM | POA: Insufficient documentation

## 2010-07-27 DIAGNOSIS — Z01812 Encounter for preprocedural laboratory examination: Secondary | ICD-10-CM | POA: Insufficient documentation

## 2010-07-27 DIAGNOSIS — Z452 Encounter for adjustment and management of vascular access device: Secondary | ICD-10-CM | POA: Insufficient documentation

## 2010-07-27 DIAGNOSIS — Y838 Other surgical procedures as the cause of abnormal reaction of the patient, or of later complication, without mention of misadventure at the time of the procedure: Secondary | ICD-10-CM | POA: Insufficient documentation

## 2010-07-27 DIAGNOSIS — E119 Type 2 diabetes mellitus without complications: Secondary | ICD-10-CM | POA: Insufficient documentation

## 2010-07-27 LAB — CBC
MCH: 30 pg (ref 26.0–34.0)
MCHC: 31.6 g/dL (ref 30.0–36.0)
Platelets: 285 10*3/uL (ref 150–400)
Platelets: 285 10*3/uL (ref 150–400)
RDW: 13.4 % (ref 11.5–15.5)
RDW: 13.5 % (ref 11.5–15.5)
WBC: 8.9 10*3/uL (ref 4.0–10.5)

## 2010-07-27 LAB — COMPREHENSIVE METABOLIC PANEL
ALT: 19 U/L (ref 0–35)
AST: 16 U/L (ref 0–37)
Albumin: 3.7 g/dL (ref 3.5–5.2)
Alkaline Phosphatase: 55 U/L (ref 39–117)
Chloride: 102 mEq/L (ref 96–112)
Creatinine, Ser: 0.7 mg/dL (ref 0.50–1.10)
Potassium: 4.3 mEq/L (ref 3.5–5.1)
Sodium: 138 mEq/L (ref 135–145)
Total Bilirubin: 0.4 mg/dL (ref 0.3–1.2)

## 2010-07-27 LAB — PROTIME-INR
INR: 0.99 (ref 0.00–1.49)
Prothrombin Time: 13.3 seconds (ref 11.6–15.2)

## 2010-07-27 LAB — APTT: aPTT: 29 seconds (ref 24–37)

## 2010-08-03 ENCOUNTER — Encounter (HOSPITAL_COMMUNITY): Payer: Medicare Other

## 2010-08-05 ENCOUNTER — Ambulatory Visit (HOSPITAL_COMMUNITY)
Admission: RE | Admit: 2010-08-05 | Discharge: 2010-08-05 | Disposition: A | Discharge status: Home / Self Care | Payer: Medicare Other | Source: Ambulatory Visit | Attending: Interventional Radiology | Admitting: Interventional Radiology

## 2010-08-05 ENCOUNTER — Ambulatory Visit (HOSPITAL_COMMUNITY)
Admission: RE | Admit: 2010-08-05 | Discharge: 2010-08-06 | Disposition: A | Discharge status: Home / Self Care | Payer: Medicare Other | Source: Ambulatory Visit | Attending: Interventional Radiology | Admitting: Interventional Radiology

## 2010-08-05 DIAGNOSIS — E119 Type 2 diabetes mellitus without complications: Secondary | ICD-10-CM | POA: Insufficient documentation

## 2010-08-05 DIAGNOSIS — E7800 Pure hypercholesterolemia, unspecified: Secondary | ICD-10-CM | POA: Insufficient documentation

## 2010-08-05 DIAGNOSIS — Z79899 Other long term (current) drug therapy: Secondary | ICD-10-CM | POA: Insufficient documentation

## 2010-08-05 DIAGNOSIS — C189 Malignant neoplasm of colon, unspecified: Secondary | ICD-10-CM

## 2010-08-05 DIAGNOSIS — I10 Essential (primary) hypertension: Secondary | ICD-10-CM | POA: Insufficient documentation

## 2010-08-05 DIAGNOSIS — C787 Secondary malignant neoplasm of liver and intrahepatic bile duct: Secondary | ICD-10-CM | POA: Insufficient documentation

## 2010-08-05 DIAGNOSIS — C19 Malignant neoplasm of rectosigmoid junction: Secondary | ICD-10-CM | POA: Insufficient documentation

## 2010-08-05 DIAGNOSIS — E0590 Thyrotoxicosis, unspecified without thyrotoxic crisis or storm: Secondary | ICD-10-CM | POA: Insufficient documentation

## 2010-08-05 LAB — CROSSMATCH: Antibody Screen: NEGATIVE

## 2010-08-05 LAB — GLUCOSE, CAPILLARY: Glucose-Capillary: 137 mg/dL — ABNORMAL HIGH (ref 70–99)

## 2010-08-06 LAB — CBC
HCT: 33.7 % — ABNORMAL LOW (ref 36.0–46.0)
MCH: 31.5 pg (ref 26.0–34.0)
MCV: 96.6 fL (ref 78.0–100.0)
Platelets: 235 10*3/uL (ref 150–400)
RBC: 3.49 MIL/uL — ABNORMAL LOW (ref 3.87–5.11)
WBC: 9.9 10*3/uL (ref 4.0–10.5)

## 2010-08-22 ENCOUNTER — Encounter (HOSPITAL_COMMUNITY): Payer: Medicare Other | Attending: Oncology | Admitting: Oncology

## 2010-08-22 ENCOUNTER — Encounter (HOSPITAL_COMMUNITY): Payer: Self-pay | Admitting: Oncology

## 2010-08-22 VITALS — BP 132/84 | HR 96 | Temp 98.1°F | Wt 182.0 lb

## 2010-08-22 DIAGNOSIS — C787 Secondary malignant neoplasm of liver and intrahepatic bile duct: Secondary | ICD-10-CM

## 2010-08-22 DIAGNOSIS — C19 Malignant neoplasm of rectosigmoid junction: Secondary | ICD-10-CM

## 2010-08-22 DIAGNOSIS — C189 Malignant neoplasm of colon, unspecified: Secondary | ICD-10-CM

## 2010-08-22 DIAGNOSIS — Z85048 Personal history of other malignant neoplasm of rectum, rectosigmoid junction, and anus: Secondary | ICD-10-CM | POA: Insufficient documentation

## 2010-08-22 NOTE — Progress Notes (Signed)
Dict

## 2010-08-22 NOTE — Progress Notes (Signed)
CC:   [name], M.D. M. Ruel Favors, MD [name], M.D. [name], MD, [name], FACP, FAGA  DIAGNOSES: 1. 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     She is now status post radiofrequency ablation in July by Dr.     Miles Costain.  This of course is her second recurrence and radiofrequency     ablation. 2. History of stage II (T3 N0) grade 1 adenocarcinoma of the     rectosigmoid junction, status post resection in September 2007 with     11 negative nodes.  She was treated at that time with radiation and     concomitant radiosensitizing capecitabine, but no further systemic     therapy due to her stage.  Eh is here today for followup with her husband and she had a little bit more discomfort after the RFA ablation, she states this time, but it was mild.  She was able to function of course.  She looks good today.  Vital signs are stable today.  REVIEW OF SYSTEMS:  Oncologically is negative.  She is planning on going on a cruise in September and returning the end of September.  She will be available to start therapy the week of October 1.  We therefore we will plan on treating her that week.  We will use FOLFOX-6 because if this recurs, she may be eligible for a protocol at [facility] with Dr. Valetta Close and we need to fail FOLFOX-6.  We will also use Avastin since she has documented metastatic disease.  We will treat her for 6 full cycles if all goes well.  We will get things set up.  I will not do any blood work now.  She also had her Port-A-Cath replaced by Dr. Miles Costain recently and that looks fine. She has no complaints, as I mentioned and the pain has disappeared essentially completely now.  We will see her back, sooner if need be.    ______________________________ Ladona Horns. Mariel Sleet, MD ESN/MEDQ  D:  08/22/2010  T:  08/22/2010  Job:  [id number]

## 2010-08-22 NOTE — Patient Instructions (Signed)
[facility]  Discharge Instructions  RECOMMENDATIONS MADE BY THE CONSULTANT AND ANY TEST RESULTS WILL BE SENT TO YOUR REFERRING DOCTOR.        SPECIAL INSTRUCTIONS/FOLLOW-UP: We will start chemotherapy Oct 2nd after you are back from vacation.  See Dr.Neijstrom prior to starting chemo.   I acknowledge that I have been informed and understand all the instructions given to me and received a copy. I do not have any more questions at this time, but understand that I may call the Specialty Clinic at [facility] at [phone] during business hours should I have any further questions or need assistance in obtaining follow-up care.    __________________________________________  _____________  __________ Signature of Patient or Authorized Representative            Date                   Time    __________________________________________ Nurse's Signature

## 2010-08-23 ENCOUNTER — Other Ambulatory Visit (HOSPITAL_COMMUNITY): Payer: Self-pay | Admitting: Oncology

## 2010-08-23 DIAGNOSIS — C189 Malignant neoplasm of colon, unspecified: Secondary | ICD-10-CM

## 2010-08-31 ENCOUNTER — Encounter (HOSPITAL_BASED_OUTPATIENT_CLINIC_OR_DEPARTMENT_OTHER): Payer: Medicare Other

## 2010-08-31 DIAGNOSIS — C189 Malignant neoplasm of colon, unspecified: Secondary | ICD-10-CM

## 2010-08-31 DIAGNOSIS — Z452 Encounter for adjustment and management of vascular access device: Secondary | ICD-10-CM

## 2010-08-31 MED ORDER — SODIUM CHLORIDE 0.9 % IJ SOLN
INTRAMUSCULAR | Status: AC
  Administered 2010-08-31: 10 mL via INTRAVENOUS
  Filled 2010-08-31: qty 10

## 2010-08-31 MED ORDER — HEPARIN SOD (PORK) LOCK FLUSH 100 UNIT/ML IV SOLN
INTRAVENOUS | Status: AC
  Administered 2010-08-31: 500 [IU] via INTRAVENOUS
  Filled 2010-08-31: qty 5

## 2010-08-31 MED ORDER — HEPARIN SOD (PORK) LOCK FLUSH 100 UNIT/ML IV SOLN
500.0000 [IU] | Freq: Once | INTRAVENOUS | Status: AC
  Administered 2010-08-31: 500 [IU] via INTRAVENOUS

## 2010-08-31 MED ORDER — SODIUM CHLORIDE 0.9 % IJ SOLN
10.0000 mL | Freq: Once | INTRAMUSCULAR | Status: AC
  Administered 2010-08-31: 10 mL via INTRAVENOUS

## 2010-08-31 NOTE — Progress Notes (Signed)
[name] presented for Portacath access and flush @ 1330. Proper placement of portacath confirmed by CXR. Portacath located left chest wall accessed with  H 20 needle. Good blood return present. Portacath flushed with 20ml NS and 500U/12ml Heparin and needle removed intact. Procedure without incident. Patient tolerated procedure well.

## 2010-09-03 HISTORY — PX: PORTACATH PLACEMENT: SHX2246

## 2010-09-14 ENCOUNTER — Other Ambulatory Visit: Payer: Self-pay | Admitting: Interventional Radiology

## 2010-09-14 DIAGNOSIS — R160 Hepatomegaly, not elsewhere classified: Secondary | ICD-10-CM

## 2010-09-14 DIAGNOSIS — C19 Malignant neoplasm of rectosigmoid junction: Secondary | ICD-10-CM

## 2010-09-20 ENCOUNTER — Other Ambulatory Visit (HOSPITAL_COMMUNITY): Payer: Self-pay | Admitting: Oncology

## 2010-09-20 DIAGNOSIS — C189 Malignant neoplasm of colon, unspecified: Secondary | ICD-10-CM

## 2010-09-21 ENCOUNTER — Other Ambulatory Visit (HOSPITAL_COMMUNITY): Payer: Self-pay | Admitting: *Deleted

## 2010-09-21 DIAGNOSIS — C189 Malignant neoplasm of colon, unspecified: Secondary | ICD-10-CM

## 2010-09-30 LAB — COMPREHENSIVE METABOLIC PANEL
ALT: 16
AST: 19
Alkaline Phosphatase: 43
CO2: 28
Calcium: 9
Chloride: 106
GFR calc Af Amer: >60
GFR calc non Af Amer: >60
Glucose, Bld: 109 — ABNORMAL HIGH
Potassium: 3.8
Sodium: 140
Total Bilirubin: 0.5

## 2010-09-30 LAB — CBC
HCT: 37.9
Hemoglobin: 12.9
RBC: 3.9
RDW: 14

## 2010-09-30 LAB — DIFFERENTIAL
Basophils Relative: 1
Eosinophils Absolute: 0.1
Eosinophils Relative: 2
Lymphs Abs: 1.6
Neutrophils Relative %: 62

## 2010-09-30 LAB — FERRITIN: Ferritin: 27 (ref 10–291)

## 2010-09-30 LAB — CEA: CEA: 16.1 — ABNORMAL HIGH

## 2010-09-30 LAB — GLUCOSE, CAPILLARY: Glucose-Capillary: 94

## 2010-10-03 ENCOUNTER — Encounter (HOSPITAL_COMMUNITY): Payer: Medicare Other | Attending: Oncology | Admitting: Oncology

## 2010-10-03 DIAGNOSIS — C19 Malignant neoplasm of rectosigmoid junction: Secondary | ICD-10-CM

## 2010-10-03 DIAGNOSIS — C189 Malignant neoplasm of colon, unspecified: Secondary | ICD-10-CM | POA: Insufficient documentation

## 2010-10-03 DIAGNOSIS — C787 Secondary malignant neoplasm of liver and intrahepatic bile duct: Secondary | ICD-10-CM

## 2010-10-03 LAB — COMPREHENSIVE METABOLIC PANEL
ALT: 16
ALT: 17 U/L (ref 0–35)
ALT: 15
AST: 17
Alkaline Phosphatase: 55 U/L (ref 39–117)
Alkaline Phosphatase: 43
BUN: 10
CO2: 27
CO2: 24
Calcium: 9.3
Chloride: 106
GFR calc Af Amer: >60
GFR calc Af Amer: >90 mL/min (ref >90)
GFR calc non Af Amer: 60 — ABNORMAL LOW
GFR calc non Af Amer: >60
Glucose, Bld: 125 mg/dL — ABNORMAL HIGH (ref 70–99)
Glucose, Bld: 194 — ABNORMAL HIGH
Potassium: 3.8 mEq/L (ref 3.5–5.1)
Potassium: 3.5
Sodium: 138 mEq/L (ref 135–145)
Sodium: 141
Sodium: 138
Total Bilirubin: 0.6
Total Protein: 7 g/dL (ref 6.0–8.3)
Total Protein: 6.4

## 2010-10-03 LAB — DIFFERENTIAL
Basophils Absolute: 0
Basophils Relative: 1
Eosinophils Absolute: 0.2 10*3/uL (ref 0.0–0.7)
Eosinophils Absolute: 0.1
Lymphocytes Relative: 28 % (ref 12–46)
Lymphs Abs: 2 10*3/uL (ref 0.7–4.0)
Monocytes Relative: 9
Neutro Abs: 3.2
Neutrophils Relative %: 62 % (ref 43–77)
Neutrophils Relative %: 63

## 2010-10-03 LAB — CBC
HCT: 33.5 — ABNORMAL LOW
HCT: 34.9 — ABNORMAL LOW
HCT: 36.4
HCT: 35.1 — ABNORMAL LOW
Hemoglobin: 11.2 — ABNORMAL LOW
Hemoglobin: 12.2
Hemoglobin: 11.9 — ABNORMAL LOW
MCH: 30.9 pg (ref 26.0–34.0)
MCHC: 33.1
MCHC: 33.5
MCV: 97.5
MCV: 97.9
MCV: 98.2
MCV: 97.4
Platelets: 258
Platelets: 330 10*3/uL (ref 150–400)
Platelets: 245
RBC: 3.42 — ABNORMAL LOW
RBC: 3.82 MIL/uL — ABNORMAL LOW (ref 3.87–5.11)
RDW: 13
RDW: 13.1
RDW: 13.1
WBC: 6.6
WBC: 7.2 10*3/uL (ref 4.0–10.5)
WBC: 5.1

## 2010-10-03 LAB — GLUCOSE, CAPILLARY
Glucose-Capillary: 118 — ABNORMAL HIGH
Glucose-Capillary: 130 — ABNORMAL HIGH
Glucose-Capillary: 190 — ABNORMAL HIGH
Glucose-Capillary: 47 — ABNORMAL LOW
Glucose-Capillary: 67 — ABNORMAL LOW
Glucose-Capillary: 82

## 2010-10-03 LAB — CROSSMATCH: ABO/RH(D): AB POS

## 2010-10-03 LAB — BASIC METABOLIC PANEL
BUN: 13
Creatinine, Ser: 1.11
GFR calc non Af Amer: 49 — ABNORMAL LOW
Glucose, Bld: 167 — ABNORMAL HIGH

## 2010-10-03 LAB — MAGNESIUM: Magnesium: 2.1

## 2010-10-03 LAB — HEMOGLOBIN AND HEMATOCRIT, BLOOD: Hemoglobin: 12.3

## 2010-10-03 MED ORDER — PROCHLORPERAZINE 25 MG RE SUPP: 25.0000 mg | Freq: Two times a day (BID) | RECTAL | Status: DC | PRN

## 2010-10-03 MED ORDER — ONDANSETRON HCL 8 MG PO TABS: ORAL_TABLET | ORAL | Status: DC

## 2010-10-03 MED ORDER — LORAZEPAM 0.5 MG PO TABS: 0.5000 mg | ORAL_TABLET | Freq: Four times a day (QID) | ORAL | Status: DC | PRN

## 2010-10-03 MED ORDER — DEXAMETHASONE 4 MG PO TABS: ORAL_TABLET | ORAL | Status: DC

## 2010-10-03 NOTE — Progress Notes (Signed)
CC:   [name], MD, Caleen Essex, FAGA [name], M.D. M. Ruel Favors, MD Delaney Meigs, M.D.  DIAGNOSIS:  Recurrent adenocarcinoma of the rectosigmoid junction once again to the liver, biopsy proven, followed by a repeat radiofrequency ablation by Dr. Miles Costain.  That procedure was done on 08/05/2010.  She is here today for followup, about to begin adjuvant chemotherapy with FOLFOX plus Avastin.  She is here today to be seen, examined, and get some baseline blood work.  Her CT scan in followed by Dr. Miles Costain, which will be our new baseline is scheduled for October 10.  She is doing well, totally asymptomatic on oncologic review of systems.  She had her port placed uneventfully.  There is a small little stitch sticking out that is about 2 or 3 mm long in the lateral portion of that scar, but the port otherwise seems intact.  There is no erythema or tenderness around this entire area.  PHYSICAL EXAMINATION:  Vital Signs:  The rest of the physical exam shows stability of vital signs with a weight of 181 pounds, blood pressure 169/88, pulse is right around 68 and regular, respirations 16 to 18 and unlabored at rest.  She is afebrile.  Lymph Nodes:  Negative throughout. Lungs:  She has clear lung fields to auscultation and percussion.  No CVA tenderness.  Heart:  Shows a regular rhythm and rate without murmur, rub, or gallop.  Abdomen:  Soft, nontender, without organomegaly or masses.  Extremities:  She has no peripheral edema of the legs or arms.  We will see her back in a month.  Will get Tom, my PA to see her.  I will see her a month after that.  We will go with 6 full cycles of therapy. I think I answered all their questions to their satisfaction and she is absolutely ready to proceed.    ______________________________ Ladona Horns. Mariel Sleet, MD ESN/MEDQ  D:  10/03/2010  T:  10/03/2010  Job:  [id number]

## 2010-10-03 NOTE — Patient Instructions (Signed)
[facility]  Discharge Instructions  RECOMMENDATIONS MADE BY THE CONSULTANT AND ANY TEST RESULTS WILL BE SENT TO YOUR REFERRING DOCTOR.   EXAM FINDINGS BY MD TODAY AND SIGNS AND SYMPTOMS TO REPORT TO CLINIC OR PRIMARY MD: Discussed starting new chemotherapy drugs. Oxaliplatin, leucovorin and 5 fu using the home infusion pump.  MEDICATIONS PRESCRIBED: nausea meds sent to [facility] pharmacy Follow label directions  INSTRUCTIONS GIVEN AND DISCUSSED:   SPECIAL INSTRUCTIONS/FOLLOW-UP: Return to Clinic on  One month to see Jenita Seashore PA   I acknowledge that I have been informed and understand all the instructions given to me and received a copy. I do not have any more questions at this time, but understand that I may call the Specialty Clinic at [facility] at [phone] during business hours should I have any further questions or need assistance in obtaining follow-up care.    __________________________________________  _____________  __________ Signature of Patient or Authorized Representative            Date                   Time    __________________________________________ Nurse's Signature

## 2010-10-03 NOTE — Progress Notes (Deleted)
[facility] Discharge Instructions for Patients Receiving Chemotherapy  Today you received the following chemotherapy agents ***  To help prevent nausea and vomiting after your treatment, we encourage you to take your nausea medication {CHL ONC AP TAKE HOME ZOXW:960454098} Begin taking it at *** and take it as often as prescribed for the next {CHL ONC MEDICATION HOURS:115400110} hours.   If you develop nausea and vomiting that is not controlled by your nausea medication, call the clinic. If it is after clinic hours your family physician or the after hours number for the clinic or go to the Emergency Department.   BELOW ARE SYMPTOMS THAT SHOULD BE REPORTED IMMEDIATELY:  *FEVER GREATER THAN 101.0 F  *CHILLS WITH OR WITHOUT FEVER  NAUSEA AND VOMITING THAT IS NOT CONTROLLED WITH YOUR NAUSEA MEDICATION  *UNUSUAL SHORTNESS OF BREATH  *UNUSUAL BRUISING OR BLEEDING  TENDERNESS IN MOUTH AND THROAT WITH OR WITHOUT PRESENCE OF ULCERS  *URINARY PROBLEMS  *BOWEL PROBLEMS  UNUSUAL RASH Items with * indicate a potential emergency and should be followed up as soon as possible.  One of the nurses will contact you 24 hours after your treatment. Please let the nurse know about any problems that you may have experienced. Feel free to call the clinic you have any questions or concerns. The clinic phone number is [phone].   I have been informed and understand all the instructions given to me. I know to contact the clinic, my physician, or go to the Emergency Department if any problems should occur. I do not have any questions at this time, but understand that I may call the clinic during office hours or the Patient Navigator at [phone] should I have any questions or need assistance in obtaining follow up care.    __________________________________________  _____________  __________ Signature of Patient or Authorized Representative            Date                    Time    __________________________________________ Nurse's Signature

## 2010-10-04 ENCOUNTER — Encounter (HOSPITAL_BASED_OUTPATIENT_CLINIC_OR_DEPARTMENT_OTHER): Payer: Medicare Other

## 2010-10-04 VITALS — BP 157/87 | HR 79 | Temp 97.8°F | Wt 181.8 lb

## 2010-10-04 DIAGNOSIS — C787 Secondary malignant neoplasm of liver and intrahepatic bile duct: Secondary | ICD-10-CM

## 2010-10-04 DIAGNOSIS — C189 Malignant neoplasm of colon, unspecified: Secondary | ICD-10-CM

## 2010-10-04 DIAGNOSIS — C19 Malignant neoplasm of rectosigmoid junction: Secondary | ICD-10-CM

## 2010-10-04 DIAGNOSIS — Z5111 Encounter for antineoplastic chemotherapy: Secondary | ICD-10-CM

## 2010-10-04 LAB — DIFFERENTIAL
Basophils Absolute: 0 10*3/uL (ref 0.0–0.1)
Basophils Absolute: 0 10*3/uL (ref 0.0–0.1)
Basophils Absolute: 0
Basophils Relative: 0 % (ref 0–1)
Basophils Relative: 0
Eosinophils Absolute: 0.3 10*3/uL (ref 0.0–0.7)
Eosinophils Absolute: 0 10*3/uL (ref 0.0–0.7)
Eosinophils Absolute: 0
Eosinophils Relative: 6 % — ABNORMAL HIGH (ref 0–5)
Eosinophils Relative: 1
Lymphocytes Relative: 18
Lymphs Abs: 0.3 10*3/uL — ABNORMAL LOW (ref 0.7–4.0)
Lymphs Abs: 0.5 — ABNORMAL LOW
Monocytes Absolute: 0.6 10*3/uL (ref 0.1–1.0)
Monocytes Absolute: 1.3 10*3/uL — ABNORMAL HIGH (ref 0.1–1.0)
Monocytes Absolute: 0.5
Monocytes Relative: 50 % — ABNORMAL HIGH (ref 3–12)
Monocytes Relative: 17 — ABNORMAL HIGH
Neutro Abs: 1.9
Neutrophils Relative %: 65
WBC Morphology: INCREASED

## 2010-10-04 LAB — BASIC METABOLIC PANEL
Calcium: 8.2 — ABNORMAL LOW
Creatinine, Ser: 0.9
GFR calc Af Amer: >60

## 2010-10-04 LAB — CBC
HCT: 34.4 — ABNORMAL LOW
Hemoglobin: 12.1 g/dL (ref 12.0–15.0)
Hemoglobin: 12.1
MCHC: 34.6 g/dL (ref 30.0–36.0)
MCHC: 35
MCV: 98.2 fL (ref 78.0–100.0)
MCV: 97
Platelets: 244 10*3/uL (ref 150–400)
Platelets: 225
RBC: 3.59 MIL/uL — ABNORMAL LOW (ref 3.87–5.11)
RBC: 3.39 MIL/uL — ABNORMAL LOW (ref 3.87–5.11)
RBC: 3.55 — ABNORMAL LOW
RDW: 14
WBC: 4.8 10*3/uL (ref 4.0–10.5)
WBC: 2.6 10*3/uL — ABNORMAL LOW (ref 4.0–10.5)
WBC: 2.9 — ABNORMAL LOW

## 2010-10-04 LAB — URINALYSIS, DIPSTICK ONLY
Leukocytes, UA: NEGATIVE
Nitrite: NEGATIVE
Nitrite: NEGATIVE
Protein, ur: NEGATIVE mg/dL
Protein, ur: NEGATIVE mg/dL
Specific Gravity, Urine: 1.025 (ref 1.005–1.030)
Urobilinogen, UA: 0.2 mg/dL (ref 0.0–1.0)
Urobilinogen, UA: 0.2 mg/dL (ref 0.0–1.0)

## 2010-10-04 LAB — COMPREHENSIVE METABOLIC PANEL
Alkaline Phosphatase: 33 U/L — ABNORMAL LOW (ref 39–117)
BUN: 11 mg/dL (ref 6–23)
Glucose, Bld: 168 mg/dL — ABNORMAL HIGH (ref 70–99)
Potassium: 3.3 mEq/L — ABNORMAL LOW (ref 3.5–5.1)
Total Bilirubin: 1.3 mg/dL — ABNORMAL HIGH (ref 0.3–1.2)
Total Protein: 4.9 g/dL — ABNORMAL LOW (ref 6.0–8.3)

## 2010-10-04 LAB — CEA: CEA: 3.4 ng/mL (ref 0.0–5.0)

## 2010-10-04 MED ORDER — ONDANSETRON 8 MG/50ML IVPB (CHCC): 8.0000 mg | Freq: Once | INTRAVENOUS | Status: DC

## 2010-10-04 MED ORDER — SODIUM CHLORIDE 0.9 % IV SOLN
Freq: Once | INTRAVENOUS | Status: AC
  Administered 2010-10-04: 8 mg via INTRAVENOUS
  Filled 2010-10-04: qty 4

## 2010-10-04 MED ORDER — HEPARIN SOD (PORK) LOCK FLUSH 100 UNIT/ML IV SOLN
500.0000 [IU] | Freq: Once | INTRAVENOUS | Status: DC | PRN
  Filled 2010-10-04: qty 5

## 2010-10-04 MED ORDER — SODIUM CHLORIDE 0.9 % IJ SOLN
10.0000 mL | INTRAMUSCULAR | Status: DC | PRN
  Filled 2010-10-04: qty 10

## 2010-10-04 MED ORDER — SODIUM CHLORIDE 0.9 % IV SOLN
2400.0000 mg/m2 | INTRAVENOUS | Status: DC
  Administered 2010-10-04: 4650 mg via INTRAVENOUS
  Filled 2010-10-04 (×2): qty 93

## 2010-10-04 MED ORDER — LEUCOVORIN CALCIUM INJECTION 100 MG
39.0000 mg | Freq: Once | INTRAMUSCULAR | Status: AC
  Administered 2010-10-04: 40 mg via INTRAVENOUS
  Filled 2010-10-04: qty 2

## 2010-10-04 MED ORDER — OXALIPLATIN CHEMO INJECTION 100 MG/20ML
85.0000 mg/m2 | Freq: Once | INTRAVENOUS | Status: AC
  Administered 2010-10-04: 165 mg via INTRAVENOUS
  Filled 2010-10-04: qty 33

## 2010-10-04 MED ORDER — DEXTROSE 5 % IV BOLUS
50.0000 mL | INTRAVENOUS | Status: AC
  Administered 2010-10-04: 50 mL via INTRAVENOUS
  Filled 2010-10-04 (×2): qty 50

## 2010-10-04 MED ORDER — DEXAMETHASONE SODIUM PHOSPHATE 10 MG/ML IJ SOLN: 10.0000 mg | Freq: Once | INTRAMUSCULAR | Status: DC

## 2010-10-04 MED ORDER — FLUOROURACIL CHEMO INJECTION 2.5 GM/50ML
400.0000 mg/m2 | Freq: Once | INTRAVENOUS | Status: AC
  Administered 2010-10-04: 750 mg via INTRAVENOUS
  Filled 2010-10-04: qty 15

## 2010-10-04 MED ORDER — SODIUM CHLORIDE 0.9 % IV SOLN
Freq: Once | INTRAVENOUS | Status: AC
  Administered 2010-10-04: 09:00:00 via INTRAVENOUS

## 2010-10-04 MED ORDER — LEUCOVORIN CALCIUM INJECTION 350 MG: 400.0000 mg/m2 | Freq: Once | INTRAVENOUS | Status: DC

## 2010-10-04 MED ORDER — SODIUM CHLORIDE 0.9 % IV SOLN
5.0000 mg/kg | Freq: Once | INTRAVENOUS | Status: AC
  Administered 2010-10-04: 425 mg via INTRAVENOUS
  Filled 2010-10-04: qty 17

## 2010-10-04 MED ORDER — DEXTROSE 5 % IV SOLN
Freq: Once | INTRAVENOUS | Status: DC
  Filled 2010-10-04: qty 50

## 2010-10-04 NOTE — Progress Notes (Signed)
Addended by: Dennie Maizes on: 10/04/2010 04:50 PM   Modules accepted: Orders

## 2010-10-04 NOTE — Patient Instructions (Signed)
[facility]   CHEMOTHERAPY INSTRUCTIONS  Oxaliplatin, Leucovorin, 5FU  POTENTIAL SIDE EFFECTS OF TREATMENT: Increased Susceptibility to Infection, Vomiting, Constipation, Hair Thinning, Changes in Character of Skin and Nails (brittleness, dryness,etc.), Pigment Changes (darkening of nail beds, palms of hands, soles of feet, etc.), Bone Marrow Suppression, Abdominal Cramping and Urinary Frequency   SELF IMAGE NEEDS AND REFERRALS MADE: Referral to Look Good, Feel Better consultant   EDUCATIONAL MATERIALS GIVEN AND REVIEWED: Chemotherapy and You   SELF CARE ACTIVITIES WHILE ON CHEMOTHERAPY: Increase your fluid intake 48 hours prior to treatment and drink at least 2 quarts per day after treatment., No alcohol intake., No aspirin or other medications unless approved by your oncologist., Eat foods that are light and easy to digest., No fried, fatty, or spicy foods immediately before or after treatment., Have teeth cleaned professionally before starting treatment. Keep dentures and partial plates clean., Use soft toothbrush and do not use mouthwashes that contain alcohol. Biotene is a good mouthwash that is available at most pharmacies or may be ordered by calling [phone]., Use warm salt water gargles (1 teaspoon salt per 1 quart warm water) before and after meals and at bedtime. Or you may rinse with 2 tablespoons of three -percent hydrogen peroxide mixed in eight ounces of water., Always use sunscreen with SPF (Sun Protection Factor) of 15 or higher. and Use your nausea medication as directed to prevent nausea.   MEDICATIONS: You have been given prescriptions for the following medications: Ativan 0.5mg  every 6 hours as needed for nausea or vomiting Dexamethasone (take as directed by Dr. Mariel Sleet - only if you need it - because it can run your blood sugar up. Follow directions on label   SYMPTOMS TO REPORT AS SOON AS POSSIBLE AFTER TREATMENT:  FEVER  GREATER THAN 100.5 F  CHILLS WITH OR WITHOUT FEVER  NAUSEA AND VOMITING THAT IS NOT CONTROLLED WITH YOUR NAUSEA MEDICATION  UNUSUAL SHORTNESS OF BREATH  UNUSUAL BRUISING OR BLEEDING  TENDERNESS IN MOUTH AND THROAT WITH OR WITHOUT PRESENCE OF ULCERS  URINARY PROBLEMS  BOWEL PROBLEMS  UNUSUAL RASH    Wear comfortable clothing and clothing appropriate for easy access to any Portacath or PICC line. Let us know if there is anything that we can do to make your therapy better!      I have been informed and understand all of the instructions given to me and have received a copy. I have been instructed to call the clinic [phone] or my family physician as soon as possible for continued medical care, if indicated. I do not have any more questions at this time but understand that I may call the Cancer Center or the Patient Navigator at [phone] during office hours should I have questions or need assistance in obtaining follow-up care.      _________________________________________      _______________     __________ Signature of Patient or Authorized Representative        Date                            Time      _________________________________________ Nurse's Signature

## 2010-10-06 ENCOUNTER — Encounter (HOSPITAL_BASED_OUTPATIENT_CLINIC_OR_DEPARTMENT_OTHER): Payer: Medicare Other

## 2010-10-06 DIAGNOSIS — C787 Secondary malignant neoplasm of liver and intrahepatic bile duct: Secondary | ICD-10-CM

## 2010-10-06 DIAGNOSIS — C189 Malignant neoplasm of colon, unspecified: Secondary | ICD-10-CM

## 2010-10-06 DIAGNOSIS — C19 Malignant neoplasm of rectosigmoid junction: Secondary | ICD-10-CM

## 2010-10-06 DIAGNOSIS — Z452 Encounter for adjustment and management of vascular access device: Secondary | ICD-10-CM

## 2010-10-06 MED ORDER — HEPARIN SOD (PORK) LOCK FLUSH 100 UNIT/ML IV SOLN
INTRAVENOUS | Status: AC
  Filled 2010-10-06: qty 5

## 2010-10-06 MED ORDER — SODIUM CHLORIDE 0.9 % IJ SOLN
INTRAMUSCULAR | Status: AC
  Filled 2010-10-06: qty 10

## 2010-10-06 MED ORDER — HEPARIN SOD (PORK) LOCK FLUSH 100 UNIT/ML IV SOLN
500.0000 [IU] | Freq: Once | INTRAVENOUS | Status: AC
  Administered 2010-10-06: 500 [IU] via INTRAVENOUS
  Filled 2010-10-06: qty 5

## 2010-10-06 MED ORDER — SODIUM CHLORIDE 0.9 % IJ SOLN
10.0000 mL | INTRAMUSCULAR | Status: DC | PRN
  Administered 2010-10-06: 10 mL via INTRAVENOUS
  Filled 2010-10-06: qty 10

## 2010-10-06 NOTE — Progress Notes (Signed)
[name] presented for Portacath access and flush. Proper placement of portacath confirmed by CXR. Portacath located left chest wall accessed with 20g huber needle.  Portacath flushed with 20ml NS and 500U/63ml Heparin and needle removed intact. Procedure without incident. Patient tolerated procedure well.

## 2010-10-07 ENCOUNTER — Ambulatory Visit (HOSPITAL_COMMUNITY): Payer: Medicare Other | Admitting: Oncology

## 2010-10-07 LAB — CLOSTRIDIUM DIFFICILE EIA: C difficile Toxins A+B, EIA: NEGATIVE

## 2010-10-07 LAB — URINE CULTURE: Colony Count: >=100000

## 2010-10-07 LAB — DIFFERENTIAL
Band Neutrophils: 0 % (ref 0–10)
Basophils Absolute: 0 10*3/uL (ref 0.0–0.1)
Basophils Absolute: 0 10*3/uL (ref 0.0–0.1)
Basophils Absolute: 0 10*3/uL (ref 0.0–0.1)
Basophils Absolute: 0.1 10*3/uL (ref 0.0–0.1)
Basophils Relative: 0 % (ref 0–1)
Basophils Relative: 0 % (ref 0–1)
Basophils Relative: 0 % (ref 0–1)
Basophils Relative: 1 % (ref 0–1)
Basophils Relative: 1 % (ref 0–1)
Blasts: 0 %
Eosinophils Absolute: 0 10*3/uL (ref 0.0–0.7)
Eosinophils Absolute: 0.1 10*3/uL (ref 0.0–0.7)
Eosinophils Absolute: 0.1 10*3/uL (ref 0.0–0.7)
Eosinophils Absolute: 0.1 10*3/uL (ref 0.0–0.7)
Eosinophils Absolute: 0.3 10*3/uL (ref 0.0–0.7)
Eosinophils Relative: 1 % (ref 0–5)
Eosinophils Relative: 1 % (ref 0–5)
Eosinophils Relative: 1 % (ref 0–5)
Eosinophils Relative: 4 % (ref 0–5)
Lymphocytes Relative: 14 % (ref 12–46)
Lymphocytes Relative: 15 % (ref 12–46)
Lymphs Abs: 0.7 10*3/uL (ref 0.7–4.0)
Lymphs Abs: 0.8 10*3/uL (ref 0.7–4.0)
Lymphs Abs: 0.9 10*3/uL (ref 0.7–4.0)
Lymphs Abs: 1.1 10*3/uL (ref 0.7–4.0)
Lymphs Abs: 1.1 10*3/uL (ref 0.7–4.0)
Metamyelocytes Relative: 0 %
Monocytes Absolute: 0.7 10*3/uL (ref 0.1–1.0)
Monocytes Absolute: 0.9 10*3/uL (ref 0.1–1.0)
Monocytes Absolute: 1 10*3/uL (ref 0.1–1.0)
Monocytes Absolute: 1.1 10*3/uL — ABNORMAL HIGH (ref 0.1–1.0)
Monocytes Relative: 17 % — ABNORMAL HIGH (ref 3–12)
Monocytes Relative: 20 % — ABNORMAL HIGH (ref 3–12)
Monocytes Relative: 20 % — ABNORMAL HIGH (ref 3–12)
Monocytes Relative: 9 % (ref 3–12)
Neutro Abs: 3.4 10*3/uL (ref 1.7–7.7)
Neutro Abs: 5.2 10*3/uL (ref 1.7–7.7)
Neutro Abs: 7.6 10*3/uL (ref 1.7–7.7)
Neutrophils Relative %: 64 % (ref 43–77)
Neutrophils Relative %: 69 % (ref 43–77)
Neutrophils Relative %: 78 % — ABNORMAL HIGH (ref 43–77)
Promyelocytes Absolute: 0 %
WBC Morphology: INCREASED

## 2010-10-07 LAB — GLUCOSE, CAPILLARY
Glucose-Capillary: 161 mg/dL — ABNORMAL HIGH (ref 70–99)
Glucose-Capillary: 181 mg/dL — ABNORMAL HIGH (ref 70–99)
Glucose-Capillary: 182 mg/dL — ABNORMAL HIGH (ref 70–99)
Glucose-Capillary: 185 mg/dL — ABNORMAL HIGH (ref 70–99)
Glucose-Capillary: 197 mg/dL — ABNORMAL HIGH (ref 70–99)
Glucose-Capillary: 206 mg/dL — ABNORMAL HIGH (ref 70–99)
Glucose-Capillary: 208 mg/dL — ABNORMAL HIGH (ref 70–99)

## 2010-10-07 LAB — COMPREHENSIVE METABOLIC PANEL
ALT: 13 U/L (ref 0–35)
ALT: 23 U/L (ref 0–35)
ALT: 24 U/L (ref 0–35)
ALT: 30 U/L (ref 0–35)
AST: 17 U/L (ref 0–37)
AST: 21 U/L (ref 0–37)
AST: 24 U/L (ref 0–37)
AST: 33 U/L (ref 0–37)
Albumin: 2.2 g/dL — ABNORMAL LOW (ref 3.5–5.2)
Albumin: 2.5 g/dL — ABNORMAL LOW (ref 3.5–5.2)
Alkaline Phosphatase: 42 U/L (ref 39–117)
Alkaline Phosphatase: 72 U/L (ref 39–117)
Alkaline Phosphatase: 89 U/L (ref 39–117)
CO2: 26 mEq/L (ref 19–32)
CO2: 26 mEq/L (ref 19–32)
CO2: 26 mEq/L (ref 19–32)
CO2: 29 mEq/L (ref 19–32)
Calcium: 7.9 mg/dL — ABNORMAL LOW (ref 8.4–10.5)
Chloride: 97 mEq/L (ref 96–112)
Chloride: 97 mEq/L (ref 96–112)
Chloride: 97 mEq/L (ref 96–112)
Chloride: 98 mEq/L (ref 96–112)
Creatinine, Ser: 0.63 mg/dL (ref 0.4–1.2)
Creatinine, Ser: 0.71 mg/dL (ref 0.4–1.2)
Creatinine, Ser: 0.98 mg/dL (ref 0.4–1.2)
GFR calc Af Amer: >60 mL/min (ref >60)
GFR calc Af Amer: >60 mL/min (ref >60)
GFR calc Af Amer: >60 mL/min (ref >60)
GFR calc Af Amer: >60 mL/min (ref >60)
GFR calc non Af Amer: 52 mL/min — ABNORMAL LOW (ref >60)
GFR calc non Af Amer: >60 mL/min (ref >60)
GFR calc non Af Amer: >60 mL/min (ref >60)
Glucose, Bld: 229 mg/dL — ABNORMAL HIGH (ref 70–99)
Potassium: 3.7 mEq/L (ref 3.5–5.1)
Potassium: 3.9 mEq/L (ref 3.5–5.1)
Potassium: 4.1 mEq/L (ref 3.5–5.1)
Sodium: 130 mEq/L — ABNORMAL LOW (ref 135–145)
Sodium: 131 mEq/L — ABNORMAL LOW (ref 135–145)
Sodium: 134 mEq/L — ABNORMAL LOW (ref 135–145)
Total Bilirubin: 0.6 mg/dL (ref 0.3–1.2)
Total Bilirubin: 0.7 mg/dL (ref 0.3–1.2)
Total Bilirubin: 0.9 mg/dL (ref 0.3–1.2)

## 2010-10-07 LAB — CBC
HCT: 30.9 % — ABNORMAL LOW (ref 36.0–46.0)
Hemoglobin: 10.4 g/dL — ABNORMAL LOW (ref 12.0–15.0)
Hemoglobin: 11.2 g/dL — ABNORMAL LOW (ref 12.0–15.0)
Hemoglobin: 12.5 g/dL (ref 12.0–15.0)
MCHC: 33.2 g/dL (ref 30.0–36.0)
MCHC: 33.3 g/dL (ref 30.0–36.0)
MCHC: 33.5 g/dL (ref 30.0–36.0)
MCHC: 33.6 g/dL (ref 30.0–36.0)
MCHC: 34 g/dL (ref 30.0–36.0)
MCHC: 34.3 g/dL (ref 30.0–36.0)
MCV: 97.5 fL (ref 78.0–100.0)
MCV: 99.5 fL (ref 78.0–100.0)
MCV: 99.6 fL (ref 78.0–100.0)
Platelets: 223 10*3/uL (ref 150–400)
Platelets: 257 10*3/uL (ref 150–400)
Platelets: 423 10*3/uL — ABNORMAL HIGH (ref 150–400)
RBC: 3.1 MIL/uL — ABNORMAL LOW (ref 3.87–5.11)
RBC: 3.34 MIL/uL — ABNORMAL LOW (ref 3.87–5.11)
RBC: 3.36 MIL/uL — ABNORMAL LOW (ref 3.87–5.11)
RBC: 3.47 MIL/uL — ABNORMAL LOW (ref 3.87–5.11)
RBC: 3.52 MIL/uL — ABNORMAL LOW (ref 3.87–5.11)
RBC: 3.63 MIL/uL — ABNORMAL LOW (ref 3.87–5.11)
RBC: 3.75 MIL/uL — ABNORMAL LOW (ref 3.87–5.11)
RDW: 16.9 % — ABNORMAL HIGH (ref 11.5–15.5)
WBC: 10.3 10*3/uL (ref 4.0–10.5)
WBC: 4.4 10*3/uL (ref 4.0–10.5)
WBC: 5.2 10*3/uL (ref 4.0–10.5)
WBC: 6 10*3/uL (ref 4.0–10.5)
WBC: 7.5 10*3/uL (ref 4.0–10.5)
WBC: 7.9 10*3/uL (ref 4.0–10.5)
WBC: 9.8 10*3/uL (ref 4.0–10.5)

## 2010-10-07 LAB — BASIC METABOLIC PANEL
BUN: 11 mg/dL (ref 6–23)
BUN: 6 mg/dL (ref 6–23)
CO2: 24 mEq/L (ref 19–32)
Calcium: 7.3 mg/dL — ABNORMAL LOW (ref 8.4–10.5)
Calcium: 7.8 mg/dL — ABNORMAL LOW (ref 8.4–10.5)
Calcium: 8.4 mg/dL (ref 8.4–10.5)
Chloride: 101 mEq/L (ref 96–112)
Creatinine, Ser: 0.73 mg/dL (ref 0.4–1.2)
Creatinine, Ser: 0.87 mg/dL (ref 0.4–1.2)
Creatinine, Ser: 0.91 mg/dL (ref 0.4–1.2)
GFR calc Af Amer: >60 mL/min (ref >60)
GFR calc Af Amer: >60 mL/min (ref >60)
GFR calc Af Amer: >60 mL/min (ref >60)
GFR calc non Af Amer: >60 mL/min (ref >60)
Glucose, Bld: 175 mg/dL — ABNORMAL HIGH (ref 70–99)
Potassium: 3.7 mEq/L (ref 3.5–5.1)
Sodium: 131 mEq/L — ABNORMAL LOW (ref 135–145)

## 2010-10-07 LAB — FECAL LACTOFERRIN, QUANT: Fecal Lactoferrin: POSITIVE

## 2010-10-07 LAB — URINALYSIS, ROUTINE W REFLEX MICROSCOPIC
Glucose, UA: 100 mg/dL — AB
Hgb urine dipstick: NEGATIVE
Ketones, ur: 15 mg/dL — AB
Ketones, ur: 40 mg/dL — AB
Leukocytes, UA: NEGATIVE
Nitrite: POSITIVE — AB
Nitrite: POSITIVE — AB
Protein, ur: 100 mg/dL — AB
Specific Gravity, Urine: 1.02 (ref 1.005–1.030)
Urobilinogen, UA: 0.2 mg/dL (ref 0.0–1.0)
pH: 5.5 (ref 5.0–8.0)
pH: 6 (ref 5.0–8.0)

## 2010-10-07 LAB — STOOL CULTURE

## 2010-10-07 LAB — OVA AND PARASITE EXAMINATION: Ova and parasites: NONE SEEN

## 2010-10-07 LAB — MAGNESIUM: Magnesium: 1.9 mg/dL (ref 1.5–2.5)

## 2010-10-07 LAB — URINE MICROSCOPIC-ADD ON

## 2010-10-07 LAB — CEA: CEA: 3.8 ng/mL (ref 0.0–5.0)

## 2010-10-12 ENCOUNTER — Ambulatory Visit
Admission: RE | Admit: 2010-10-12 | Discharge: 2010-10-12 | Disposition: A | Discharge status: Home / Self Care | Payer: Medicare Other | Source: Ambulatory Visit | Attending: Interventional Radiology | Admitting: Interventional Radiology

## 2010-10-12 ENCOUNTER — Other Ambulatory Visit (HOSPITAL_COMMUNITY): Payer: Self-pay | Admitting: Oncology

## 2010-10-12 ENCOUNTER — Other Ambulatory Visit: Payer: Self-pay | Admitting: Interventional Radiology

## 2010-10-12 ENCOUNTER — Ambulatory Visit
Admission: RE | Admit: 2010-10-12 | Discharge: 2010-10-12 | Disposition: A | Discharge status: Home / Self Care | Payer: Medicare Other | Source: Ambulatory Visit | Attending: Oncology | Admitting: Oncology

## 2010-10-12 DIAGNOSIS — C787 Secondary malignant neoplasm of liver and intrahepatic bile duct: Secondary | ICD-10-CM

## 2010-10-12 DIAGNOSIS — C189 Malignant neoplasm of colon, unspecified: Secondary | ICD-10-CM

## 2010-10-12 DIAGNOSIS — R160 Hepatomegaly, not elsewhere classified: Secondary | ICD-10-CM

## 2010-10-12 DIAGNOSIS — C19 Malignant neoplasm of rectosigmoid junction: Secondary | ICD-10-CM

## 2010-10-12 MED ORDER — IOHEXOL 350 MG/ML SOLN
100.0000 mL | Freq: Once | INTRAVENOUS | Status: AC | PRN
  Administered 2010-10-12: 100 mL via INTRAVENOUS

## 2010-10-12 NOTE — Progress Notes (Signed)
Appetite fair.  Weight stable.  Denies discomfort associated w/ RFA of Liver (08-05-2010).  Occasional indigestion.  Denies bloating.  Last chemo Rx:  2 wks ago.  Next chemo Rx:  10/17/2010.  Pt states that the current plan is for total of 12 Rxs.  Sleeping;  Occasional insomnia.  Endurance improving.  Able to accomplish most ADL's w/ occasional rest intervals.

## 2010-10-17 ENCOUNTER — Encounter (HOSPITAL_BASED_OUTPATIENT_CLINIC_OR_DEPARTMENT_OTHER): Payer: Medicare Other

## 2010-10-17 VITALS — BP 165/83 | HR 62 | Temp 97.5°F | Ht 65.0 in | Wt 171.0 lb

## 2010-10-17 DIAGNOSIS — C19 Malignant neoplasm of rectosigmoid junction: Secondary | ICD-10-CM

## 2010-10-17 DIAGNOSIS — Z5111 Encounter for antineoplastic chemotherapy: Secondary | ICD-10-CM

## 2010-10-17 DIAGNOSIS — C189 Malignant neoplasm of colon, unspecified: Secondary | ICD-10-CM

## 2010-10-17 DIAGNOSIS — C787 Secondary malignant neoplasm of liver and intrahepatic bile duct: Secondary | ICD-10-CM

## 2010-10-17 LAB — CBC
Hemoglobin: 11.8 g/dL — ABNORMAL LOW (ref 12.0–15.0)
MCH: 31.6 pg (ref 26.0–34.0)
MCHC: 32.9 g/dL (ref 30.0–36.0)
Platelets: 237 10*3/uL (ref 150–400)
RDW: 13.6 % (ref 11.5–15.5)

## 2010-10-17 LAB — DIFFERENTIAL
Basophils Absolute: 0 10*3/uL (ref 0.0–0.1)
Basophils Relative: 0 % (ref 0–1)
Eosinophils Absolute: 0.3 10*3/uL (ref 0.0–0.7)
Monocytes Relative: 10 % (ref 3–12)
Neutro Abs: 3.5 10*3/uL (ref 1.7–7.7)
Neutrophils Relative %: 58 % (ref 43–77)

## 2010-10-17 MED ORDER — DEXTROSE 5 % IV SOLN
85.0000 mg/m2 | Freq: Once | INTRAVENOUS | Status: AC
  Administered 2010-10-17: 165 mg via INTRAVENOUS
  Filled 2010-10-17: qty 33

## 2010-10-17 MED ORDER — DEXAMETHASONE SODIUM PHOSPHATE 10 MG/ML IJ SOLN: 10.0000 mg | Freq: Once | INTRAMUSCULAR | Status: DC

## 2010-10-17 MED ORDER — LEUCOVORIN CALCIUM INJECTION 350 MG: 400.0000 mg/m2 | Freq: Once | INTRAVENOUS | Status: DC

## 2010-10-17 MED ORDER — SODIUM CHLORIDE 0.9 % IV SOLN
INTRAVENOUS | Status: DC
  Administered 2010-10-17: 12:00:00 via INTRAVENOUS

## 2010-10-17 MED ORDER — LEUCOVORIN CALCIUM INJECTION 100 MG
40.0000 mg | Freq: Once | INTRAMUSCULAR | Status: AC
  Administered 2010-10-17: 40 mg via INTRAVENOUS
  Filled 2010-10-17: qty 2

## 2010-10-17 MED ORDER — DEXTROSE 5 % IV SOLN: Freq: Once | INTRAVENOUS | Status: DC

## 2010-10-17 MED ORDER — DEXTROSE 5 % IV BOLUS
50.0000 mL | INTRAVENOUS | Status: AC
  Filled 2010-10-17 (×2): qty 50

## 2010-10-17 MED ORDER — SODIUM CHLORIDE 0.9 % IJ SOLN
INTRAMUSCULAR | Status: AC
  Administered 2010-10-17: 10 mL
  Filled 2010-10-17: qty 10

## 2010-10-17 MED ORDER — SODIUM CHLORIDE 0.9 % IV SOLN
Freq: Once | INTRAVENOUS | Status: AC
  Administered 2010-10-17: 8 mg via INTRAVENOUS
  Filled 2010-10-17: qty 4

## 2010-10-17 MED ORDER — FLUOROURACIL CHEMO INJECTION 2.5 GM/50ML
400.0000 mg/m2 | Freq: Once | INTRAVENOUS | Status: AC
  Administered 2010-10-17: 750 mg via INTRAVENOUS
  Filled 2010-10-17: qty 15

## 2010-10-17 MED ORDER — ONDANSETRON 8 MG/50ML IVPB (CHCC): 8.0000 mg | Freq: Once | INTRAVENOUS | Status: DC

## 2010-10-17 MED ORDER — SODIUM CHLORIDE 0.9 % IV SOLN
5.0000 mg/kg | Freq: Once | INTRAVENOUS | Status: AC
  Administered 2010-10-17: 425 mg via INTRAVENOUS
  Filled 2010-10-17: qty 17

## 2010-10-17 MED ORDER — SODIUM CHLORIDE 0.9 % IJ SOLN
10.0000 mL | INTRAMUSCULAR | Status: DC | PRN
  Administered 2010-10-17: 10 mL
  Filled 2010-10-17: qty 10

## 2010-10-17 MED ORDER — SODIUM CHLORIDE 0.9 % IV SOLN
2400.0000 mg/m2 | INTRAVENOUS | Status: DC
  Administered 2010-10-17: 4650 mg via INTRAVENOUS
  Filled 2010-10-17 (×2): qty 93

## 2010-10-17 MED ORDER — HEPARIN SOD (PORK) LOCK FLUSH 100 UNIT/ML IV SOLN
500.0000 [IU] | Freq: Once | INTRAVENOUS | Status: DC | PRN
  Filled 2010-10-17: qty 5

## 2010-10-19 ENCOUNTER — Encounter (HOSPITAL_BASED_OUTPATIENT_CLINIC_OR_DEPARTMENT_OTHER): Payer: Medicare Other

## 2010-10-19 DIAGNOSIS — C19 Malignant neoplasm of rectosigmoid junction: Secondary | ICD-10-CM

## 2010-10-19 DIAGNOSIS — Z452 Encounter for adjustment and management of vascular access device: Secondary | ICD-10-CM

## 2010-10-19 DIAGNOSIS — C189 Malignant neoplasm of colon, unspecified: Secondary | ICD-10-CM

## 2010-10-19 DIAGNOSIS — C787 Secondary malignant neoplasm of liver and intrahepatic bile duct: Secondary | ICD-10-CM

## 2010-10-19 MED ORDER — HEPARIN SOD (PORK) LOCK FLUSH 100 UNIT/ML IV SOLN
INTRAVENOUS | Status: AC
  Filled 2010-10-19: qty 5

## 2010-10-19 MED ORDER — HEPARIN SOD (PORK) LOCK FLUSH 100 UNIT/ML IV SOLN
500.0000 [IU] | Freq: Once | INTRAVENOUS | Status: AC
  Administered 2010-10-19: 500 [IU] via INTRAVENOUS
  Filled 2010-10-19: qty 5

## 2010-10-19 MED ORDER — SODIUM CHLORIDE 0.9 % IJ SOLN
INTRAMUSCULAR | Status: AC
  Filled 2010-10-19: qty 10

## 2010-10-19 MED ORDER — SODIUM CHLORIDE 0.9 % IJ SOLN
10.0000 mL | INTRAMUSCULAR | Status: DC | PRN
Start: 2010-10-19 — End: 2010-10-19
  Administered 2010-10-19: 10 mL via INTRAVENOUS
  Filled 2010-10-19: qty 10

## 2010-10-26 ENCOUNTER — Telehealth (HOSPITAL_COMMUNITY): Payer: Self-pay

## 2010-10-26 NOTE — Telephone Encounter (Signed)
Post chemotherapy follow-up:  Has had some problems with constipation for which she used OTC stool softener and indigestion for which she used OTC acid reducer.  Some nausea but no vomiting.  For now is doing ok without symptoms.  Encourage to increase fluid intake the day before the day of and for several days after chemotherapy and to use ativan, zofran and compazine suppositories as needed for nausea.  Verbalized understanding of instructions.

## 2010-10-31 ENCOUNTER — Encounter (HOSPITAL_BASED_OUTPATIENT_CLINIC_OR_DEPARTMENT_OTHER): Payer: Medicare Other

## 2010-10-31 VITALS — BP 163/93 | HR 83 | Temp 98.1°F | Ht 65.0 in | Wt 179.8 lb

## 2010-10-31 DIAGNOSIS — C19 Malignant neoplasm of rectosigmoid junction: Secondary | ICD-10-CM

## 2010-10-31 DIAGNOSIS — C189 Malignant neoplasm of colon, unspecified: Secondary | ICD-10-CM

## 2010-10-31 DIAGNOSIS — Z5111 Encounter for antineoplastic chemotherapy: Secondary | ICD-10-CM

## 2010-10-31 DIAGNOSIS — C787 Secondary malignant neoplasm of liver and intrahepatic bile duct: Secondary | ICD-10-CM

## 2010-10-31 LAB — COMPREHENSIVE METABOLIC PANEL
ALT: 19 U/L (ref 0–35)
AST: 14 U/L (ref 0–37)
Albumin: 3.2 g/dL — ABNORMAL LOW (ref 3.5–5.2)
CO2: 29 mEq/L (ref 19–32)
Calcium: 9.1 mg/dL (ref 8.4–10.5)
Chloride: 101 mEq/L (ref 96–112)
GFR calc non Af Amer: 84 mL/min — ABNORMAL LOW (ref >90)
Sodium: 137 mEq/L (ref 135–145)

## 2010-10-31 LAB — URINALYSIS, DIPSTICK ONLY
Bilirubin Urine: NEGATIVE
Glucose, UA: 500 mg/dL — AB
Hgb urine dipstick: NEGATIVE
Specific Gravity, Urine: 1.015 (ref 1.005–1.030)
Urobilinogen, UA: 0.2 mg/dL (ref 0.0–1.0)

## 2010-10-31 LAB — DIFFERENTIAL
Lymphocytes Relative: 43 % (ref 12–46)
Lymphs Abs: 1.6 10*3/uL (ref 0.7–4.0)
Monocytes Absolute: 0.4 10*3/uL (ref 0.1–1.0)
Monocytes Relative: 11 % (ref 3–12)
Neutro Abs: 1.5 10*3/uL — ABNORMAL LOW (ref 1.7–7.7)

## 2010-10-31 LAB — CBC
HCT: 35.8 % — ABNORMAL LOW (ref 36.0–46.0)
Hemoglobin: 11.9 g/dL — ABNORMAL LOW (ref 12.0–15.0)
WBC: 3.8 10*3/uL — ABNORMAL LOW (ref 4.0–10.5)

## 2010-10-31 MED ORDER — OXALIPLATIN CHEMO INJECTION 100 MG/20ML
85.0000 mg/m2 | Freq: Once | INTRAVENOUS | Status: AC
  Administered 2010-10-31: 165 mg via INTRAVENOUS
  Filled 2010-10-31: qty 33

## 2010-10-31 MED ORDER — DEXTROSE 5 % IV SOLN
Freq: Once | INTRAVENOUS | Status: AC
  Administered 2010-10-31: 50 mL via INTRAVENOUS

## 2010-10-31 MED ORDER — HEPARIN SOD (PORK) LOCK FLUSH 100 UNIT/ML IV SOLN
500.0000 [IU] | Freq: Once | INTRAVENOUS | Status: DC | PRN
  Filled 2010-10-31: qty 5

## 2010-10-31 MED ORDER — SODIUM CHLORIDE 0.9 % IV SOLN
2400.0000 mg/m2 | INTRAVENOUS | Status: DC
  Administered 2010-10-31: 4650 mg via INTRAVENOUS
  Filled 2010-10-31 (×2): qty 93

## 2010-10-31 MED ORDER — SODIUM CHLORIDE 0.9 % IV SOLN
Freq: Once | INTRAVENOUS | Status: AC
  Administered 2010-10-31: 8 mg via INTRAVENOUS
  Filled 2010-10-31: qty 4

## 2010-10-31 MED ORDER — FLUOROURACIL CHEMO INJECTION 2.5 GM/50ML
400.0000 mg/m2 | Freq: Once | INTRAVENOUS | Status: AC
  Administered 2010-10-31: 750 mg via INTRAVENOUS
  Filled 2010-10-31: qty 15

## 2010-10-31 MED ORDER — SODIUM CHLORIDE 0.9 % IJ SOLN
10.0000 mL | INTRAMUSCULAR | Status: DC | PRN
  Filled 2010-10-31: qty 10

## 2010-10-31 MED ORDER — ONDANSETRON 8 MG/50ML IVPB (CHCC): 8.0000 mg | Freq: Once | INTRAVENOUS | Status: DC

## 2010-10-31 MED ORDER — SODIUM CHLORIDE 0.9 % IV SOLN
Freq: Once | INTRAVENOUS | Status: AC
  Administered 2010-10-31: 09:00:00 via INTRAVENOUS

## 2010-10-31 MED ORDER — SODIUM CHLORIDE 0.9 % IV SOLN
5.0000 mg/kg | Freq: Once | INTRAVENOUS | Status: AC
  Administered 2010-10-31: 425 mg via INTRAVENOUS
  Filled 2010-10-31: qty 17

## 2010-10-31 MED ORDER — DEXAMETHASONE SODIUM PHOSPHATE 10 MG/ML IJ SOLN: 10.0000 mg | Freq: Once | INTRAMUSCULAR | Status: DC

## 2010-10-31 MED ORDER — LEUCOVORIN CALCIUM INJECTION 350 MG
20.0000 mg/m2 | Freq: Once | INTRAMUSCULAR | Status: AC
  Administered 2010-10-31: 38 mg via INTRAVENOUS
  Filled 2010-10-31: qty 1.9

## 2010-10-31 NOTE — Patient Instructions (Signed)
[facility] Discharge Instructions for Patients Receiving Chemotherapy  Today you received the following chemotherapy agents  Oxaliplatin, leucovorin and 55fu and avastin  To help prevent nausea and vomiting after your treatment, we encourage you to take your nausea medication Ativan 1mg  every 3 to 4 hours as needed for nausea or vomiting Begin taking it as needed  and take it as often as prescribed for the next 48 hours.   Your sugar is elevated and we need to adjust your meds. Please increase  glyburide to one in the morning and 2 at night---start tonight and continue for 3 days.  Continue to take your glucovance daily and janumet twice a day.  Check your sugar twice a day, record and bring that record with you Wednesday. Return Wednesday to have the pump removed.  If you develop nausea and vomiting that is not controlled by your nausea medication, call the clinic. If it is after clinic hours your family physician or the after hours number for the clinic or go to the Emergency Department.   BELOW ARE SYMPTOMS THAT SHOULD BE REPORTED IMMEDIATELY:  *FEVER GREATER THAN 101.0 F  *CHILLS WITH OR WITHOUT FEVER  NAUSEA AND VOMITING THAT IS NOT CONTROLLED WITH YOUR NAUSEA MEDICATION  *UNUSUAL SHORTNESS OF BREATH  *UNUSUAL BRUISING OR BLEEDING  TENDERNESS IN MOUTH AND THROAT WITH OR WITHOUT PRESENCE OF ULCERS  *URINARY PROBLEMS  *BOWEL PROBLEMS  UNUSUAL RASH Items with * indicate a potential emergency and should be followed up as soon as possible.  One of the nurses will contact you 24 hours after your treatment. Please let the nurse know about any problems that you may have experienced. Feel free to call the clinic you have any questions or concerns. The clinic phone number is [phone].   I have been informed and understand all the instructions given to me. I know to contact the clinic, my physician, or go to the Emergency Department if any problems should occur.  I do not have any questions at this time, but understand that I may call the clinic during office hours or the Patient Navigator at [phone] should I have any questions or need assistance in obtaining follow up care.    __________________________________________  _____________  __________ Signature of Patient or Authorized Representative            Date                   Time    __________________________________________ Nurse's Signature

## 2010-11-02 ENCOUNTER — Encounter (HOSPITAL_BASED_OUTPATIENT_CLINIC_OR_DEPARTMENT_OTHER): Payer: Medicare Other

## 2010-11-02 VITALS — BP 171/97 | HR 66 | Temp 97.9°F

## 2010-11-02 DIAGNOSIS — C19 Malignant neoplasm of rectosigmoid junction: Secondary | ICD-10-CM

## 2010-11-02 DIAGNOSIS — C787 Secondary malignant neoplasm of liver and intrahepatic bile duct: Secondary | ICD-10-CM

## 2010-11-02 DIAGNOSIS — C189 Malignant neoplasm of colon, unspecified: Secondary | ICD-10-CM

## 2010-11-02 MED ORDER — HEPARIN SOD (PORK) LOCK FLUSH 100 UNIT/ML IV SOLN
500.0000 [IU] | Freq: Once | INTRAVENOUS | Status: AC
  Administered 2010-11-02: 500 [IU] via INTRAVENOUS
  Filled 2010-11-02: qty 5

## 2010-11-02 MED ORDER — SODIUM CHLORIDE 0.9 % IJ SOLN
10.0000 mL | INTRAMUSCULAR | Status: DC | PRN
  Administered 2010-11-02: 10 mL via INTRAVENOUS
  Filled 2010-11-02: qty 10

## 2010-11-02 MED ORDER — SODIUM CHLORIDE 0.9 % IJ SOLN
INTRAMUSCULAR | Status: AC
  Filled 2010-11-02: qty 10

## 2010-11-02 MED ORDER — HEPARIN SOD (PORK) LOCK FLUSH 100 UNIT/ML IV SOLN
INTRAVENOUS | Status: AC
  Filled 2010-11-02: qty 5

## 2010-11-02 NOTE — Progress Notes (Signed)
[name] brought in list of blood glucose readings as requested per Dr.Neijstrom. As follows date/time/result; 10-29/730pm/330, 10-30/830am/200, 10-30/730pm/240, 10-31/830am/103.

## 2010-11-07 ENCOUNTER — Encounter (HOSPITAL_COMMUNITY): Payer: Medicare Other | Attending: Oncology | Admitting: Oncology

## 2010-11-07 VITALS — BP 151/86 | HR 88 | Temp 98.0°F | Wt 176.6 lb

## 2010-11-07 DIAGNOSIS — C19 Malignant neoplasm of rectosigmoid junction: Secondary | ICD-10-CM

## 2010-11-07 DIAGNOSIS — C787 Secondary malignant neoplasm of liver and intrahepatic bile duct: Secondary | ICD-10-CM

## 2010-11-07 DIAGNOSIS — C189 Malignant neoplasm of colon, unspecified: Secondary | ICD-10-CM | POA: Insufficient documentation

## 2010-11-07 NOTE — Progress Notes (Signed)
[name], MD [address] [facility] [place] [address]  1. Colon adenocarcinoma  CBC, Differential, CBC, Differential, Comprehensive metabolic panel, CBC, Differential    CURRENT THERAPY: S/P 2 cycles of FOLFOX chemotherapy.  S/P liver ablation for metastatic, recurrent disease.  INTERVAL HISTORY: [name] 70 y.o. female returns for  regular  visit for followup of recurrent colon cancer.    The patient reports grade 1 peripheral neuropathy.  She denies it interfering with ADLs.  She remains able to button buttons and manipulate items in her hand.  She explains that she believes it is residual peripheral neuropathy from her first treatment.  Otherwise she is doing well.  She does report mild constipation occasionally which resolves with stool softener.  She denies any nausea or vomiting.  She does report a 2 lb weight loss.  We will monitor her weight.  She also reports some dry hands.  She explains that she has been utilizing alcohol based hand sanitizer spray.  I have encouraged her to continue with this hand hygiene, but I have asked her to get a nonalcoholic hand moisturizer to help with her dry hands.    Past Medical History  Diagnosis Date  . Colon cancer     liver ca  . HTN (hypertension)   . Thyroid condition   . Diabetes mellitus   . Shingles   . Acute UTI     has Colon adenocarcinoma and Colon cancer on her problem list.      has no known allergies.  [name] does not currently have medications on file.  Past Surgical History  Procedure Date  . Colectomy     and bil. oopherectomy  . Appendectomy   . Blt   . Resection liver partial / total w/ radiofrequency ablation     Denies any headaches, dizziness, double vision, fevers, chills, night sweats, nausea, vomiting, diarrhea, constipation, chest pain, heart palpitations, shortness of breath, blood in stool, black tarry stool, urinary pain, urinary burning, urinary  frequency, hematuria.   PHYSICAL EXAMINATION  ECOG PERFORMANCE STATUS: 1 - Symptomatic but completely ambulatory  Filed Vitals:   11/07/10 1221  BP: 151/86  Pulse: 88  Temp: 98 F (36.7 C)    GENERAL:alert, no distress, well nourished, well developed, comfortable, cooperative and smiling SKIN: skin color, texture, turgor are normal HEAD: Normocephalic EYES: normal EARS: External ears normal OROPHARYNX:no exudate, no erythema, lips, buccal mucosa, and tongue normal and mucous membranes are moist  NECK: supple, no adenopathy, no bruits, thyroid normal size, non-tender, without nodularity, no stridor, non-tender, trachea midline LYMPH:  no palpable lymphadenopathy BREAST:not examined LUNGS: clear to auscultation and percussion HEART: regular rate & rhythm, no murmurs, no gallops, S1 normal and S2 normal ABDOMEN:abdomen soft, non-tender and normal bowel sounds BACK: Back symmetric, no curvature. EXTREMITIES:less then 2 second capillary refill, no joint deformities, effusion, or inflammation, no edema, no skin discoloration, no clubbing, no cyanosis  NEURO: alert & oriented x 3 with fluent speech, no focal motor/sensory deficits, gait normal   LABORATORY DATA: CBC    Component Value Date/Time   WBC 3.8* 10/31/2010 0830   RBC 3.74* 10/31/2010 0830   HGB 11.9* 10/31/2010 0830   HCT 35.8* 10/31/2010 0830   PLT 200 10/31/2010 0830   MCV 95.7 10/31/2010 0830   MCH 31.8 10/31/2010 0830   MCHC 33.2 10/31/2010 0830   RDW 13.9 10/31/2010 0830   LYMPHSABS 1.6 10/31/2010 0830   MONOABS 0.4 10/31/2010 0830  EOSABS 0.2 10/31/2010 0830   BASOSABS 0.1 10/31/2010 0830     PATHOLOGY: 1. Liver FNA- metastatic adenocarcinoma   ASSESSMENT:  1. Recurrent metastatic adenocarcinoma of rectosigmoid junction 2. H/O Stage 1 adenocarcinoma of rectosigmoid junction. 3. Dry skin 4. Grade 1 peripheral neuropathy.   PLAN:  1.  Pre-chemo lab work: CBC diff, alternating with CBC diff,  CMET 2.  Return in 1 month for follow-up 3. I personally reviewed and went over laboratory results with the patient. 4. Chemotherapy as scheduled 5. Recommended a good moisturizing lotion. I have asked her to apply this throughout the day and following bathing.  She may continue good hand hygiene practices.   All questions were answered. The patient knows to call the clinic with any problems, questions or concerns. We can certainly see the patient much sooner if necessary.  The patient and plan discussed with Glenford Peers, MD and he is in agreement with the aforementioned.   [name],[name]

## 2010-11-07 NOTE — Patient Instructions (Signed)
[facility]  Discharge Instructions  RECOMMENDATIONS MADE BY THE CONSULTANT AND ANY TEST RESULTS WILL BE SENT TO YOUR REFERRING DOCTOR.     SPECIAL INSTRUCTIONS/FOLLOW-UP: Return to clinic in 1 month to see Dr.Neijstrom. Continue with chemotherapy plan as scheduled.   I acknowledge that I have been informed and understand all the instructions given to me and received a copy. I do not have any more questions at this time, but understand that I may call the Specialty Clinic at [facility] at [phone] during business hours should I have any further questions or need assistance in obtaining follow-up care.    __________________________________________  _____________  __________ Signature of Patient or Authorized Representative            Date                   Time    __________________________________________ Nurse's Signature

## 2010-11-14 ENCOUNTER — Ambulatory Visit (HOSPITAL_COMMUNITY): Payer: Medicare Other

## 2010-11-14 ENCOUNTER — Encounter (HOSPITAL_COMMUNITY): Payer: Medicare Other

## 2010-11-14 ENCOUNTER — Encounter (HOSPITAL_BASED_OUTPATIENT_CLINIC_OR_DEPARTMENT_OTHER): Payer: Medicare Other

## 2010-11-14 VITALS — BP 188/91 | HR 54 | Temp 97.5°F | Wt 174.4 lb

## 2010-11-14 DIAGNOSIS — C787 Secondary malignant neoplasm of liver and intrahepatic bile duct: Secondary | ICD-10-CM

## 2010-11-14 DIAGNOSIS — Z452 Encounter for adjustment and management of vascular access device: Secondary | ICD-10-CM

## 2010-11-14 DIAGNOSIS — C19 Malignant neoplasm of rectosigmoid junction: Secondary | ICD-10-CM

## 2010-11-14 LAB — DIFFERENTIAL
Basophils Absolute: 0 10*3/uL (ref 0.0–0.1)
Eosinophils Absolute: 0.2 10*3/uL (ref 0.0–0.7)
Lymphs Abs: 1.8 10*3/uL (ref 0.7–4.0)
Neutrophils Relative %: 28 % — ABNORMAL LOW (ref 43–77)

## 2010-11-14 LAB — CBC
Hemoglobin: 11.8 g/dL — ABNORMAL LOW (ref 12.0–15.0)
MCH: 31.6 pg (ref 26.0–34.0)
MCV: 95.2 fL (ref 78.0–100.0)
RBC: 3.73 MIL/uL — ABNORMAL LOW (ref 3.87–5.11)
WBC: 3.6 10*3/uL — ABNORMAL LOW (ref 4.0–10.5)

## 2010-11-14 MED ORDER — SODIUM CHLORIDE 0.9 % IJ SOLN
10.0000 mL | Freq: Once | INTRAMUSCULAR | Status: DC
  Filled 2010-11-14: qty 10

## 2010-11-14 MED ORDER — HEPARIN SOD (PORK) LOCK FLUSH 100 UNIT/ML IV SOLN
500.0000 [IU] | Freq: Once | INTRAVENOUS | Status: AC
  Administered 2010-11-14: 500 [IU] via INTRAVENOUS
  Filled 2010-11-14: qty 5

## 2010-11-14 MED ORDER — HEPARIN SOD (PORK) LOCK FLUSH 100 UNIT/ML IV SOLN
INTRAVENOUS | Status: AC
  Filled 2010-11-14: qty 5

## 2010-11-14 MED ORDER — HEPARIN SOD (PORK) LOCK FLUSH 100 UNIT/ML IV SOLN
500.0000 [IU] | Freq: Once | INTRAVENOUS | Status: DC
  Filled 2010-11-14: qty 5

## 2010-11-14 NOTE — Progress Notes (Signed)
Labs drawn today for cbc,diff 

## 2010-11-14 NOTE — Progress Notes (Signed)
[name] presented for Portacath access and flush. Proper placement of portacath confirmed by CXR. Portacath located left chest wall accessed with  H 20 needle. No blood return and denies pain or discomfort with flush. Portacath flushed with 20ml NS and 500U/55ml Heparin and needle removed intact. Procedure without incident. Patient tolerated procedure well. Chemotherapy held x 1 week for low ANC.

## 2010-11-16 ENCOUNTER — Encounter (HOSPITAL_COMMUNITY): Payer: Medicare Other

## 2010-11-21 ENCOUNTER — Encounter (HOSPITAL_BASED_OUTPATIENT_CLINIC_OR_DEPARTMENT_OTHER): Payer: Medicare Other

## 2010-11-21 VITALS — BP 172/96 | HR 64 | Temp 97.7°F | Ht 65.0 in | Wt 175.4 lb

## 2010-11-21 DIAGNOSIS — Z5112 Encounter for antineoplastic immunotherapy: Secondary | ICD-10-CM

## 2010-11-21 DIAGNOSIS — C19 Malignant neoplasm of rectosigmoid junction: Secondary | ICD-10-CM

## 2010-11-21 DIAGNOSIS — Z5111 Encounter for antineoplastic chemotherapy: Secondary | ICD-10-CM

## 2010-11-21 DIAGNOSIS — C787 Secondary malignant neoplasm of liver and intrahepatic bile duct: Secondary | ICD-10-CM

## 2010-11-21 DIAGNOSIS — C189 Malignant neoplasm of colon, unspecified: Secondary | ICD-10-CM

## 2010-11-21 LAB — COMPREHENSIVE METABOLIC PANEL
ALT: 20 U/L (ref 0–35)
AST: 16 U/L (ref 0–37)
Alkaline Phosphatase: 65 U/L (ref 39–117)
CO2: 29 mEq/L (ref 19–32)
Chloride: 101 mEq/L (ref 96–112)
GFR calc Af Amer: >90 mL/min (ref >90)
GFR calc non Af Amer: 83 mL/min — ABNORMAL LOW (ref >90)
Glucose, Bld: 209 mg/dL — ABNORMAL HIGH (ref 70–99)
Potassium: 3.9 mEq/L (ref 3.5–5.1)
Sodium: 138 mEq/L (ref 135–145)
Total Bilirubin: 0.3 mg/dL (ref 0.3–1.2)

## 2010-11-21 LAB — DIFFERENTIAL
Eosinophils Relative: 3 % (ref 0–5)
Lymphocytes Relative: 33 % (ref 12–46)
Lymphs Abs: 1.9 10*3/uL (ref 0.7–4.0)
Neutro Abs: 3 10*3/uL (ref 1.7–7.7)
Neutrophils Relative %: 50 % (ref 43–77)

## 2010-11-21 LAB — CBC
MCV: 97.2 fL (ref 78.0–100.0)
Platelets: 251 10*3/uL (ref 150–400)
RBC: 3.91 MIL/uL (ref 3.87–5.11)
WBC: 5.9 10*3/uL (ref 4.0–10.5)

## 2010-11-21 MED ORDER — SODIUM CHLORIDE 0.9 % IV SOLN: 8.0000 mg | Freq: Once | INTRAVENOUS | Status: DC

## 2010-11-21 MED ORDER — OXALIPLATIN CHEMO INJECTION 100 MG/20ML
85.0000 mg/m2 | Freq: Once | INTRAVENOUS | Status: AC
  Administered 2010-11-21: 165 mg via INTRAVENOUS
  Filled 2010-11-21: qty 33

## 2010-11-21 MED ORDER — SODIUM CHLORIDE 0.9 % IV SOLN
5.0000 mg/kg | Freq: Once | INTRAVENOUS | Status: AC
  Administered 2010-11-21: 425 mg via INTRAVENOUS
  Filled 2010-11-21: qty 17

## 2010-11-21 MED ORDER — DEXAMETHASONE SODIUM PHOSPHATE 10 MG/ML IJ SOLN: 10.0000 mg | Freq: Once | INTRAMUSCULAR | Status: DC

## 2010-11-21 MED ORDER — SODIUM CHLORIDE 0.9 % IJ SOLN
10.0000 mL | INTRAMUSCULAR | Status: DC | PRN
  Filled 2010-11-21: qty 10

## 2010-11-21 MED ORDER — HEPARIN SOD (PORK) LOCK FLUSH 100 UNIT/ML IV SOLN
500.0000 [IU] | Freq: Once | INTRAVENOUS | Status: DC | PRN
  Filled 2010-11-21: qty 5

## 2010-11-21 MED ORDER — SODIUM CHLORIDE 0.9 % IV SOLN
2400.0000 mg/m2 | INTRAVENOUS | Status: DC
  Administered 2010-11-21: 4650 mg via INTRAVENOUS
  Filled 2010-11-21 (×2): qty 93

## 2010-11-21 MED ORDER — DEXTROSE 5 % IV SOLN
Freq: Once | INTRAVENOUS | Status: AC
  Administered 2010-11-21 (×2): via INTRAVENOUS
  Filled 2010-11-21: qty 1000

## 2010-11-21 MED ORDER — LEUCOVORIN CALCIUM INJECTION 350 MG
20.0000 mg/m2 | Freq: Once | INTRAMUSCULAR | Status: AC
  Administered 2010-11-21: 38 mg via INTRAVENOUS
  Filled 2010-11-21: qty 1.9

## 2010-11-21 MED ORDER — FLUOROURACIL CHEMO INJECTION 2.5 GM/50ML
400.0000 mg/m2 | Freq: Once | INTRAVENOUS | Status: AC
  Administered 2010-11-21: 750 mg via INTRAVENOUS
  Filled 2010-11-21: qty 15

## 2010-11-21 MED ORDER — SODIUM CHLORIDE 0.9 % IV SOLN
Freq: Once | INTRAVENOUS | Status: AC
  Administered 2010-11-21: 8 mg via INTRAVENOUS
  Filled 2010-11-21: qty 4

## 2010-11-21 NOTE — Progress Notes (Signed)
Good blood return from port today.  Port hasn't been returning blood according to pt.  Specimen obtained peripherally for labs.  Tolerated well.

## 2010-11-23 ENCOUNTER — Encounter (HOSPITAL_BASED_OUTPATIENT_CLINIC_OR_DEPARTMENT_OTHER): Payer: Medicare Other

## 2010-11-23 DIAGNOSIS — C787 Secondary malignant neoplasm of liver and intrahepatic bile duct: Secondary | ICD-10-CM

## 2010-11-23 DIAGNOSIS — C19 Malignant neoplasm of rectosigmoid junction: Secondary | ICD-10-CM

## 2010-11-23 DIAGNOSIS — Z452 Encounter for adjustment and management of vascular access device: Secondary | ICD-10-CM

## 2010-11-23 MED ORDER — HEPARIN SOD (PORK) LOCK FLUSH 100 UNIT/ML IV SOLN
500.0000 [IU] | Freq: Once | INTRAVENOUS | Status: AC
  Administered 2010-11-23: 500 [IU] via INTRAVENOUS
  Filled 2010-11-23: qty 5

## 2010-11-23 MED ORDER — HEPARIN SOD (PORK) LOCK FLUSH 100 UNIT/ML IV SOLN
INTRAVENOUS | Status: AC
  Filled 2010-11-23: qty 5

## 2010-11-23 MED ORDER — SODIUM CHLORIDE 0.9 % IJ SOLN
INTRAMUSCULAR | Status: AC
  Filled 2010-11-23: qty 10

## 2010-11-23 MED ORDER — SODIUM CHLORIDE 0.9 % IJ SOLN
10.0000 mL | INTRAMUSCULAR | Status: DC | PRN
  Administered 2010-11-23: 10 mL via INTRAVENOUS
  Filled 2010-11-23: qty 10

## 2010-11-23 NOTE — Progress Notes (Signed)
Patient presented for disconnect of continuous infusion pump.  Bag was empty.  Port flushed with 20ml NS and 500 units of Heparin and needle removed intact.  Patient tolerated well.

## 2010-11-28 ENCOUNTER — Other Ambulatory Visit (HOSPITAL_COMMUNITY): Payer: Medicare Other

## 2010-11-28 ENCOUNTER — Inpatient Hospital Stay (HOSPITAL_COMMUNITY): Payer: Medicare Other

## 2010-11-30 ENCOUNTER — Encounter (HOSPITAL_COMMUNITY): Payer: Medicare Other

## 2010-12-05 ENCOUNTER — Telehealth (HOSPITAL_COMMUNITY): Payer: Self-pay | Admitting: Oncology

## 2010-12-05 ENCOUNTER — Encounter (HOSPITAL_BASED_OUTPATIENT_CLINIC_OR_DEPARTMENT_OTHER): Payer: Medicare Other

## 2010-12-05 ENCOUNTER — Encounter (HOSPITAL_COMMUNITY): Payer: Self-pay | Admitting: Oncology

## 2010-12-05 ENCOUNTER — Encounter (HOSPITAL_COMMUNITY): Payer: Medicare Other | Attending: Oncology | Admitting: Oncology

## 2010-12-05 ENCOUNTER — Encounter (HOSPITAL_COMMUNITY): Payer: Medicare Other

## 2010-12-05 VITALS — Ht 65.0 in | Wt 172.0 lb

## 2010-12-05 DIAGNOSIS — Z5112 Encounter for antineoplastic immunotherapy: Secondary | ICD-10-CM

## 2010-12-05 DIAGNOSIS — C189 Malignant neoplasm of colon, unspecified: Secondary | ICD-10-CM

## 2010-12-05 DIAGNOSIS — Z5111 Encounter for antineoplastic chemotherapy: Secondary | ICD-10-CM

## 2010-12-05 DIAGNOSIS — E119 Type 2 diabetes mellitus without complications: Secondary | ICD-10-CM | POA: Insufficient documentation

## 2010-12-05 DIAGNOSIS — C19 Malignant neoplasm of rectosigmoid junction: Secondary | ICD-10-CM

## 2010-12-05 DIAGNOSIS — C787 Secondary malignant neoplasm of liver and intrahepatic bile duct: Secondary | ICD-10-CM

## 2010-12-05 LAB — URINALYSIS, DIPSTICK ONLY
Ketones, ur: NEGATIVE mg/dL
Leukocytes, UA: NEGATIVE
Nitrite: NEGATIVE
Protein, ur: NEGATIVE mg/dL
Urobilinogen, UA: 0.2 mg/dL (ref 0.0–1.0)
pH: 5.5 (ref 5.0–8.0)

## 2010-12-05 LAB — DIFFERENTIAL
Basophils Absolute: 0 10*3/uL (ref 0.0–0.1)
Basophils Relative: 1 % (ref 0–1)
Eosinophils Absolute: 0.2 10*3/uL (ref 0.0–0.7)
Eosinophils Relative: 4 % (ref 0–5)
Monocytes Absolute: 0.4 10*3/uL (ref 0.1–1.0)

## 2010-12-05 LAB — COMPREHENSIVE METABOLIC PANEL
ALT: 14 U/L (ref 0–35)
AST: 15 U/L (ref 0–37)
CO2: 25 mEq/L (ref 19–32)
Calcium: 9.2 mg/dL (ref 8.4–10.5)
Creatinine, Ser: 0.7 mg/dL (ref 0.50–1.10)
GFR calc non Af Amer: 86 mL/min — ABNORMAL LOW (ref >90)
Sodium: 139 mEq/L (ref 135–145)
Total Protein: 6.6 g/dL (ref 6.0–8.3)

## 2010-12-05 LAB — CBC
HCT: 36.9 % (ref 36.0–46.0)
MCH: 31.9 pg (ref 26.0–34.0)
MCHC: 33.1 g/dL (ref 30.0–36.0)
MCV: 96.6 fL (ref 78.0–100.0)
Platelets: 222 10*3/uL (ref 150–400)
RDW: 14.9 % (ref 11.5–15.5)
WBC: 4.3 10*3/uL (ref 4.0–10.5)

## 2010-12-05 LAB — CEA: CEA: 3 ng/mL (ref 0.0–5.0)

## 2010-12-05 MED ORDER — DEXTROSE 5 % IV BOLUS
50.0000 mL | INTRAVENOUS | Status: AC
  Filled 2010-12-05 (×2): qty 50

## 2010-12-05 MED ORDER — DEXAMETHASONE SODIUM PHOSPHATE 10 MG/ML IJ SOLN: 10.0000 mg | Freq: Once | INTRAMUSCULAR | Status: DC

## 2010-12-05 MED ORDER — SODIUM CHLORIDE 0.9 % IV SOLN: 8.0000 mg | Freq: Once | INTRAVENOUS | Status: DC

## 2010-12-05 MED ORDER — SODIUM CHLORIDE 0.9 % IV SOLN
5.0000 mg/kg | Freq: Once | INTRAVENOUS | Status: AC
  Administered 2010-12-05: 425 mg via INTRAVENOUS
  Filled 2010-12-05: qty 17

## 2010-12-05 MED ORDER — SODIUM CHLORIDE 0.9 % IV SOLN
Freq: Once | INTRAVENOUS | Status: AC
  Administered 2010-12-05: 8 mg via INTRAVENOUS
  Filled 2010-12-05: qty 4

## 2010-12-05 MED ORDER — LEUCOVORIN CALCIUM INJECTION 100 MG
38.0000 mg | Freq: Once | INTRAMUSCULAR | Status: AC
  Administered 2010-12-05: 38 mg via INTRAVENOUS
  Filled 2010-12-05: qty 1.9

## 2010-12-05 MED ORDER — HEPARIN SOD (PORK) LOCK FLUSH 100 UNIT/ML IV SOLN
500.0000 [IU] | Freq: Once | INTRAVENOUS | Status: DC | PRN
  Filled 2010-12-05: qty 5

## 2010-12-05 MED ORDER — SODIUM CHLORIDE 0.9 % IV SOLN
2400.0000 mg/m2 | INTRAVENOUS | Status: DC
  Administered 2010-12-05: 4650 mg via INTRAVENOUS
  Filled 2010-12-05 (×2): qty 93

## 2010-12-05 MED ORDER — FLUOROURACIL CHEMO INJECTION 2.5 GM/50ML
400.0000 mg/m2 | Freq: Once | INTRAVENOUS | Status: AC
  Administered 2010-12-05: 750 mg via INTRAVENOUS
  Filled 2010-12-05: qty 15

## 2010-12-05 MED ORDER — OXALIPLATIN CHEMO INJECTION 100 MG/20ML
85.0000 mg/m2 | Freq: Once | INTRAVENOUS | Status: AC
  Administered 2010-12-05: 165 mg via INTRAVENOUS
  Filled 2010-12-05: qty 33

## 2010-12-05 MED ORDER — DEXTROSE 5 % IV SOLN: Freq: Once | INTRAVENOUS | Status: DC

## 2010-12-05 MED ORDER — LEUCOVORIN CALCIUM INJECTION 350 MG: 400.0000 mg/m2 | Freq: Once | INTRAVENOUS | Status: DC

## 2010-12-05 MED ORDER — SODIUM CHLORIDE 0.9 % IV SOLN: Freq: Once | INTRAVENOUS | Status: DC

## 2010-12-05 NOTE — Progress Notes (Signed)
Anita Hector, MD [address] [facility] [place] [address]  1. Colon adenocarcinoma  CBC, Differential, Comprehensive metabolic panel, CEA, CEA, CBC, Differential, CBC, Differential, Comprehensive metabolic panel, CBC, Differential  2. Diabetes mellitus  Hemoglobin A1c, Hemoglobin A1c    CURRENT THERAPY:S/P 2 cycles of FOLFOX chemotherapy. S/P liver ablation for metastatic, recurrent disease.   INTERVAL HISTORY: [name] 70 y.o. female returns for  regular  visit for followup of  recurrent colon cancer.   The patient reports that she had a wonderful Thanksgiving holiday. She explains that she had a large family gathering for the holiday. He explains that on Thanksgiving day, she went to bed at approximately 8:30 PM and did not wake up until after 9 AM. She says she was absolutely exhausted.  Oncologically, the patient denies any complaints. She does reports that her peripheral neuropathy resolves prior to her next cycle of chemotherapy. She explains that today her peripheral neuropathy is nearly resolved. But following chemotherapy administration, her peripheral neuropathy will return. Patient continues to have some dry skin although this is much improved compared to last time. I've asked her to continue utilizing moisturizing lotion. The patient admits to occasional loose stools. She correlates this with food products she consumes.  The patient denies any fevers, chills, night sweats. She denies any abdominal pain. Denies any chest pain.  She reports that she again is going to have family over to her house for the Christmas holiday. Of course she is looking forward to this.  Patient was encouraged to call the clinic with any problems. Today, she denies any complaints that require intervention.   Past Medical History  Diagnosis Date  . Colon cancer     liver ca  . HTN (hypertension)   . Thyroid condition   . Diabetes mellitus   . Shingles     . Acute UTI     has Colon adenocarcinoma and Colon cancer on her problem list.      has no known allergies.  [name] had no medications administered during this visit.  Past Surgical History  Procedure Date  . Colectomy     and bil. oopherectomy  . Appendectomy   . Blt   . Resection liver partial / total w/ radiofrequency ablation   . Port-a-cath removal   . Portacath placement 09/2010    Denies any headaches, dizziness, double vision, fevers, chills, night sweats, nausea, vomiting, diarrhea, constipation, chest pain, heart palpitations, shortness of breath, blood in stool, black tarry stool, urinary pain, urinary burning, urinary frequency, hematuria.   PHYSICAL EXAMINATION  ECOG PERFORMANCE STATUS: 1 - Symptomatic but completely ambulatory  Filed Vitals:   12/05/10 0933  BP: 143/87  Pulse: 73  Temp: 97.9 F (36.6 C)    GENERAL:alert, no distress, well nourished, well developed, comfortable, cooperative and smiling SKIN: skin color, texture, turgor are normal HEAD: Normocephalic EYES: normal EARS: External ears normal OROPHARYNX:mucous membranes are moist  NECK: supple, no adenopathy, no bruits, thyroid normal size, non-tender, without nodularity, no stridor, non-tender, trachea midline LYMPH:  no palpable lymphadenopathy BREAST:not examined LUNGS: clear to auscultation and percussion HEART: regular rate & rhythm, no murmurs, no gallops, S1 normal and S2 normal ABDOMEN:abdomen soft, non-tender, obese and normal bowel sounds BACK: Back symmetric, no curvature., No CVA tenderness EXTREMITIES:less then 2 second capillary refill, no joint deformities, effusion, or inflammation, no edema, no skin discoloration, no clubbing, no cyanosis  NEURO: alert & oriented x 3 with fluent speech, no focal  motor/sensory deficits, gait normal   LABORATORY DATA: CBC    Component Value Date/Time   WBC 4.3 12/05/2010 0930   RBC 3.82* 12/05/2010 0930   HGB 12.2 12/05/2010 0930   HCT  36.9 12/05/2010 0930   PLT 222 12/05/2010 0930   MCV 96.6 12/05/2010 0930   MCH 31.9 12/05/2010 0930   MCHC 33.1 12/05/2010 0930   RDW 14.9 12/05/2010 0930   LYMPHSABS 1.4 12/05/2010 0930   MONOABS 0.4 12/05/2010 0930   EOSABS 0.2 12/05/2010 0930   BASOSABS 0.0 12/05/2010 0930      Chemistry      Component Value Date/Time   NA 139 12/05/2010 0930   K 3.5 12/05/2010 0930   CL 102 12/05/2010 0930   CO2 25 12/05/2010 0930   BUN 8 12/05/2010 0930   CREATININE 0.70 12/05/2010 0930      Component Value Date/Time   CALCIUM 9.2 12/05/2010 0930   ALKPHOS 58 12/05/2010 0930   AST 15 12/05/2010 0930   ALT 14 12/05/2010 0930   BILITOT 0.4 12/05/2010 0930     Lab Results  Component Value Date   CEA 4.1 10/31/2010    PATHOLOGY: 1. Liver FNA- metastatic adenocarcinoma     ASSESSMENT:  1. Recurrent metastatic adenocarcinoma of rectosigmoid junction  2. H/O Stage 1 adenocarcinoma of rectosigmoid junction.  3. Dry skin  4. Grade 1 peripheral neuropathy.   PLAN:  1. Lab work today: CEA, Hgb A1c 2. Will send PCP results from Hgb A1c 3. Pre-chemo lab work ordered 4. Return in 1 month's time for follow-up 5. Continue with chemotherapy as scheduled 6. Continue utilizing moisturizing lotion for dry skin   All questions were answered. The patient knows to call the clinic with any problems, questions or concerns. We can certainly see the patient much sooner if necessary.  The patient and plan discussed with Arlan Organ, MD and he is in agreement with the aforementioned.   KEFALAS,THOMAS

## 2010-12-05 NOTE — Progress Notes (Signed)
Infusion pump d/c. Port flushed per protocol with Normal saline 20 ml and Heparin 500 units. Tolerated well.

## 2010-12-05 NOTE — Progress Notes (Signed)
[name] presented for [employer]. Labs per MD order drawn via Peripheral Line 23 gauge needle inserted in Left AC   Good blood return present. Procedure without incident.  Needle removed intact. Patient tolerated procedure well.

## 2010-12-05 NOTE — Patient Instructions (Signed)
[name]  [id number] Aug 02, 1940  [facility]  Discharge Instructions  RECOMMENDATIONS MADE BY THE CONSULTANT AND ANY TEST RESULTS WILL BE SENT TO YOUR REFERRING DOCTOR.   EXAM FINDINGS BY MD TODAY AND SIGNS AND SYMPTOMS TO REPORT TO CLINIC OR PRIMARY MD: You are doing well, we will continue with therapy.  MEDICATIONS PRESCRIBED: none   INSTRUCTIONS GIVEN AND DISCUSSED: Other :  Report uncontrolled nausea, vomiting, pain or shortness of breath.  SPECIAL INSTRUCTIONS/FOLLOW-UP: Return to Clinic on 1 month for follow-up.   I acknowledge that I have been informed and understand all the instructions given to me and received a copy. I do not have any more questions at this time, but understand that I may call the Specialty Clinic at [facility] at [phone] during business hours should I have any further questions or need assistance in obtaining follow-up care.    __________________________________________  _____________  __________ Signature of Patient or Authorized Representative            Date                   Time    __________________________________________ Nurse's Signature

## 2010-12-07 ENCOUNTER — Encounter (HOSPITAL_BASED_OUTPATIENT_CLINIC_OR_DEPARTMENT_OTHER): Payer: Medicare Other

## 2010-12-07 DIAGNOSIS — C19 Malignant neoplasm of rectosigmoid junction: Secondary | ICD-10-CM

## 2010-12-07 DIAGNOSIS — C787 Secondary malignant neoplasm of liver and intrahepatic bile duct: Secondary | ICD-10-CM

## 2010-12-07 DIAGNOSIS — C189 Malignant neoplasm of colon, unspecified: Secondary | ICD-10-CM

## 2010-12-07 MED ORDER — HEPARIN SOD (PORK) LOCK FLUSH 100 UNIT/ML IV SOLN
500.0000 [IU] | Freq: Once | INTRAVENOUS | Status: AC
  Administered 2010-12-07: 500 [IU] via INTRAVENOUS
  Filled 2010-12-07: qty 5

## 2010-12-07 MED ORDER — HEPARIN SOD (PORK) LOCK FLUSH 100 UNIT/ML IV SOLN
INTRAVENOUS | Status: AC
  Administered 2010-12-07: 500 [IU] via INTRAVENOUS
  Filled 2010-12-07: qty 5

## 2010-12-07 MED ORDER — SODIUM CHLORIDE 0.9 % IJ SOLN
10.0000 mL | INTRAMUSCULAR | Status: DC | PRN
  Administered 2010-12-07: 10 mL via INTRAVENOUS
  Filled 2010-12-07: qty 10

## 2010-12-07 MED ORDER — SODIUM CHLORIDE 0.9 % IJ SOLN
INTRAMUSCULAR | Status: AC
  Administered 2010-12-07: 10 mL via INTRAVENOUS
  Filled 2010-12-07: qty 10

## 2010-12-07 NOTE — Progress Notes (Signed)
Continuous infusion pump D/C.  Tolerated well.  Port flushed per protocol.

## 2010-12-12 ENCOUNTER — Other Ambulatory Visit (HOSPITAL_COMMUNITY): Payer: Medicare Other

## 2010-12-13 ENCOUNTER — Ambulatory Visit (HOSPITAL_COMMUNITY): Payer: Medicare Other | Admitting: Oncology

## 2010-12-19 ENCOUNTER — Encounter (HOSPITAL_BASED_OUTPATIENT_CLINIC_OR_DEPARTMENT_OTHER): Payer: Medicare Other

## 2010-12-19 ENCOUNTER — Other Ambulatory Visit (HOSPITAL_COMMUNITY): Payer: Self-pay | Admitting: Oncology

## 2010-12-19 VITALS — BP 176/92 | HR 73 | Temp 97.5°F | Ht 65.0 in | Wt 172.6 lb

## 2010-12-19 DIAGNOSIS — C189 Malignant neoplasm of colon, unspecified: Secondary | ICD-10-CM

## 2010-12-19 DIAGNOSIS — C19 Malignant neoplasm of rectosigmoid junction: Secondary | ICD-10-CM

## 2010-12-19 DIAGNOSIS — Z452 Encounter for adjustment and management of vascular access device: Secondary | ICD-10-CM

## 2010-12-19 LAB — CBC
MCH: 32.6 pg (ref 26.0–34.0)
MCHC: 33.7 g/dL (ref 30.0–36.0)
MCV: 96.5 fL (ref 78.0–100.0)
Platelets: 173 10*3/uL (ref 150–400)
RBC: 3.47 MIL/uL — ABNORMAL LOW (ref 3.87–5.11)
RDW: 15.3 % (ref 11.5–15.5)

## 2010-12-19 LAB — COMPREHENSIVE METABOLIC PANEL
ALT: 17 U/L (ref 0–35)
Calcium: 9.4 mg/dL (ref 8.4–10.5)
GFR calc Af Amer: >90 mL/min (ref >90)
Glucose, Bld: 214 mg/dL — ABNORMAL HIGH (ref 70–99)
Sodium: 139 mEq/L (ref 135–145)
Total Protein: 6.1 g/dL (ref 6.0–8.3)

## 2010-12-19 LAB — URINALYSIS, DIPSTICK ONLY
Glucose, UA: 500 mg/dL — AB
Specific Gravity, Urine: 1.03 (ref 1.005–1.030)

## 2010-12-19 LAB — DIFFERENTIAL
Eosinophils Absolute: 0.1 10*3/uL (ref 0.0–0.7)
Eosinophils Relative: 3 % (ref 0–5)
Lymphs Abs: 1.5 10*3/uL (ref 0.7–4.0)

## 2010-12-19 MED ORDER — HEPARIN SOD (PORK) LOCK FLUSH 100 UNIT/ML IV SOLN
INTRAVENOUS | Status: AC
  Filled 2010-12-19: qty 5

## 2010-12-19 MED ORDER — SODIUM CHLORIDE 0.9 % IJ SOLN
INTRAMUSCULAR | Status: AC
  Filled 2010-12-19: qty 10

## 2010-12-19 MED ORDER — SODIUM CHLORIDE 0.9 % IJ SOLN
10.0000 mL | INTRAMUSCULAR | Status: DC | PRN
  Filled 2010-12-19: qty 10

## 2010-12-19 NOTE — Progress Notes (Signed)
Chemo postponed for one week due to low ANC.  Discussed neutropenic precautions.  Labs obtained from port.

## 2010-12-22 ENCOUNTER — Other Ambulatory Visit (HOSPITAL_COMMUNITY): Payer: Self-pay | Admitting: Oncology

## 2010-12-23 ENCOUNTER — Other Ambulatory Visit (HOSPITAL_COMMUNITY): Payer: Medicare Other

## 2010-12-29 ENCOUNTER — Telehealth (HOSPITAL_COMMUNITY): Payer: Self-pay | Admitting: Oncology

## 2010-12-30 ENCOUNTER — Other Ambulatory Visit (HOSPITAL_COMMUNITY): Payer: Self-pay | Admitting: Oncology

## 2011-01-04 ENCOUNTER — Encounter (HOSPITAL_COMMUNITY): Payer: Medicare Other | Attending: Oncology

## 2011-01-04 DIAGNOSIS — C189 Malignant neoplasm of colon, unspecified: Secondary | ICD-10-CM | POA: Insufficient documentation

## 2011-01-04 DIAGNOSIS — Z5111 Encounter for antineoplastic chemotherapy: Secondary | ICD-10-CM

## 2011-01-04 DIAGNOSIS — C19 Malignant neoplasm of rectosigmoid junction: Secondary | ICD-10-CM

## 2011-01-04 DIAGNOSIS — C787 Secondary malignant neoplasm of liver and intrahepatic bile duct: Secondary | ICD-10-CM

## 2011-01-04 LAB — DIFFERENTIAL
Basophils Absolute: 0 10*3/uL (ref 0.0–0.1)
Basophils Relative: 1 % (ref 0–1)
Eosinophils Absolute: 0.1 10*3/uL (ref 0.0–0.7)
Eosinophils Relative: 1 % (ref 0–5)
Monocytes Absolute: 0.6 10*3/uL (ref 0.1–1.0)
Monocytes Relative: 8 % (ref 3–12)
Neutro Abs: 5.1 10*3/uL (ref 1.7–7.7)

## 2011-01-04 LAB — CBC
HCT: 35.8 % — ABNORMAL LOW (ref 36.0–46.0)
Hemoglobin: 11.8 g/dL — ABNORMAL LOW (ref 12.0–15.0)
MCV: 98.6 fL (ref 78.0–100.0)
RBC: 3.63 MIL/uL — ABNORMAL LOW (ref 3.87–5.11)
WBC: 7.6 10*3/uL (ref 4.0–10.5)

## 2011-01-04 LAB — URINALYSIS, DIPSTICK ONLY
Glucose, UA: NEGATIVE mg/dL
Hgb urine dipstick: NEGATIVE
Ketones, ur: NEGATIVE mg/dL
Protein, ur: NEGATIVE mg/dL
Urobilinogen, UA: 0.2 mg/dL (ref 0.0–1.0)

## 2011-01-04 MED ORDER — DEXAMETHASONE SODIUM PHOSPHATE 10 MG/ML IJ SOLN: 10.0000 mg | Freq: Once | INTRAMUSCULAR | Status: DC

## 2011-01-04 MED ORDER — SODIUM CHLORIDE 0.9 % IV SOLN
5.0000 mg/kg | Freq: Once | INTRAVENOUS | Status: AC
  Administered 2011-01-04: 425 mg via INTRAVENOUS
  Filled 2011-01-04: qty 17

## 2011-01-04 MED ORDER — OXALIPLATIN CHEMO INJECTION 100 MG/20ML
85.0000 mg/m2 | Freq: Once | INTRAVENOUS | Status: AC
  Administered 2011-01-04: 165 mg via INTRAVENOUS
  Filled 2011-01-04: qty 33

## 2011-01-04 MED ORDER — DEXTROSE 5 % IV SOLN
50.0000 mL | INTRAVENOUS | Status: AC
  Filled 2011-01-04 (×2): qty 50

## 2011-01-04 MED ORDER — FLUOROURACIL CHEMO INJECTION 2.5 GM/50ML
400.0000 mg/m2 | Freq: Once | INTRAVENOUS | Status: AC
  Administered 2011-01-04: 750 mg via INTRAVENOUS
  Filled 2011-01-04: qty 15

## 2011-01-04 MED ORDER — SODIUM CHLORIDE 0.9 % IV SOLN
2400.0000 mg/m2 | INTRAVENOUS | Status: DC
  Administered 2011-01-04: 4650 mg via INTRAVENOUS
  Filled 2011-01-04 (×2): qty 93

## 2011-01-04 MED ORDER — SODIUM CHLORIDE 0.9 % IJ SOLN
10.0000 mL | INTRAMUSCULAR | Status: DC | PRN
  Administered 2011-01-04: 10 mL
  Filled 2011-01-04: qty 10

## 2011-01-04 MED ORDER — SODIUM CHLORIDE 0.9 % IV SOLN
Freq: Once | INTRAVENOUS | Status: AC
  Administered 2011-01-04: 8 mg via INTRAVENOUS
  Filled 2011-01-04: qty 4

## 2011-01-04 MED ORDER — SODIUM CHLORIDE 0.9 % IJ SOLN
INTRAMUSCULAR | Status: AC
  Administered 2011-01-04: 10 mL
  Filled 2011-01-04: qty 10

## 2011-01-04 MED ORDER — LEUCOVORIN CALCIUM INJECTION 350 MG: 400.0000 mg/m2 | Freq: Once | INTRAMUSCULAR | Status: DC

## 2011-01-04 MED ORDER — LEUCOVORIN CALCIUM INJECTION 100 MG
38.0000 mg | Freq: Once | INTRAMUSCULAR | Status: AC
  Administered 2011-01-04: 38 mg via INTRAVENOUS
  Filled 2011-01-04: qty 1.9

## 2011-01-04 MED ORDER — SODIUM CHLORIDE 0.9 % IV SOLN: 8.0000 mg | Freq: Once | INTRAVENOUS | Status: DC

## 2011-01-04 NOTE — Progress Notes (Unsigned)
Tolerated chemo well. 

## 2011-01-05 ENCOUNTER — Ambulatory Visit (HOSPITAL_COMMUNITY): Payer: Medicare Other | Admitting: Oncology

## 2011-01-06 ENCOUNTER — Encounter (HOSPITAL_BASED_OUTPATIENT_CLINIC_OR_DEPARTMENT_OTHER): Payer: Medicare Other | Admitting: Oncology

## 2011-01-06 ENCOUNTER — Encounter (HOSPITAL_COMMUNITY): Payer: Medicare Other

## 2011-01-06 VITALS — BP 198/97 | HR 80 | Temp 97.5°F

## 2011-01-06 DIAGNOSIS — C787 Secondary malignant neoplasm of liver and intrahepatic bile duct: Secondary | ICD-10-CM

## 2011-01-06 DIAGNOSIS — C189 Malignant neoplasm of colon, unspecified: Secondary | ICD-10-CM

## 2011-01-06 DIAGNOSIS — C19 Malignant neoplasm of rectosigmoid junction: Secondary | ICD-10-CM

## 2011-01-06 DIAGNOSIS — G589 Mononeuropathy, unspecified: Secondary | ICD-10-CM

## 2011-01-06 MED ORDER — HEPARIN SOD (PORK) LOCK FLUSH 100 UNIT/ML IV SOLN
INTRAVENOUS | Status: AC
  Administered 2011-01-06: 500 [IU]
  Filled 2011-01-06: qty 5

## 2011-01-06 MED ORDER — SODIUM CHLORIDE 0.9 % IJ SOLN
10.0000 mL | INTRAMUSCULAR | Status: DC | PRN
  Administered 2011-01-06: 10 mL
  Filled 2011-01-06: qty 10

## 2011-01-06 MED ORDER — SODIUM CHLORIDE 0.9 % IJ SOLN
INTRAMUSCULAR | Status: AC
  Filled 2011-01-06: qty 10

## 2011-01-06 MED ORDER — HEPARIN SOD (PORK) LOCK FLUSH 100 UNIT/ML IV SOLN
500.0000 [IU] | Freq: Once | INTRAVENOUS | Status: AC | PRN
  Administered 2011-01-06: 500 [IU]
  Filled 2011-01-06: qty 5

## 2011-01-06 NOTE — Patient Instructions (Signed)
[facility]  Discharge Instructions  RECOMMENDATIONS MADE BY THE CONSULTANT AND ANY TEST RESULTS WILL BE SENT TO YOUR REFERRING DOCTOR.   EXAM FINDINGS BY MD TODAY AND SIGNS AND SYMPTOMS TO REPORT TO CLINIC OR PRIMARY MD:   Next labs due on January 13, 2011 @ 8:40  Next chemo treatment: Monday January 16, 2011 @ 8:45  Pump removed on Wednesday January 18, 2011 @ the time the nurse tells you to return  See [name] again on February 01, 2011 @ 2:30.    I acknowledge that I have been informed and understand all the instructions given to me and received a copy. I do not have any more questions at this time, but understand that I may call the Specialty Clinic at [facility] at [phone] during business hours should I have any further questions or need assistance in obtaining follow-up care.    __________________________________________  _____________  __________ Signature of Patient or Authorized Representative            Date                   Time    __________________________________________ Nurse's Signature

## 2011-01-06 NOTE — Progress Notes (Signed)
Anita Hector, MD, MD [address] [place] [address]  1. Colon adenocarcinoma  SCHEDULING COMMUNICATION, sodium chloride 0.9 % injection 10 mL, heparin lock flush 100 unit/mL, CBC, Differential, Comprehensive metabolic panel, Urinalysis, dipstick only, CBC, Differential, Comprehensive metabolic panel, Urinalysis, dipstick only, CBC, Differential, Comprehensive metabolic panel, Urinalysis, dipstick only, CEA    CURRENT THERAPY:S/P 6 cycles of FOLFOX + Avastin chemotherapy. S/P liver ablation for metastatic, recurrent disease.  INTERVAL HISTORY: [name] 71 y.o. female returns for  regular  visit for followup of metastatic, recurrent disease.  The patient reports some mild fatigue which is usual for her following chemotherapy administration.  The patient reports that she usually begins feeling much improved 1 week following chemotherapy.  The patient was noted to be hypertensive by the triage nurse.  After discussion today in the exam room, I rechecked her BP manually.  I got a BP of 165/90 compared to 198/97.  We will monitor this closely.  On he next visit, if her BP is elevated, will initiate Norvasc 5 mg daily for hypertension.  Otherwise, the patient denies any complaints.  Oncologic ROS questioning is negative.    I personally reviewed and went over laboratory results with the patient.  Her CEA was recently measured at 2.1.  She is pleased with the result.   Past Medical History  Diagnosis Date  . Colon cancer     liver ca  . HTN (hypertension)   . Thyroid condition   . Diabetes mellitus   . Shingles   . Acute UTI     has Colon adenocarcinoma and Colon cancer on her problem list.      has no known allergies.  We administered sodium chloride, heparin lock flush, and heparin lock flush.  Past Surgical History  Procedure Date  . Colectomy     and bil. oopherectomy  . Appendectomy   . Blt   . Resection liver partial / total w/ radiofrequency ablation   .  Port-a-cath removal   . Portacath placement 09/2010    Denies any headaches, dizziness, double vision, fevers, chills, night sweats, nausea, vomiting, diarrhea, constipation, chest pain, heart palpitations, shortness of breath, blood in stool, black tarry stool, urinary pain, urinary burning, urinary frequency, hematuria.   PHYSICAL EXAMINATION  ECOG PERFORMANCE STATUS: 1 - Symptomatic but completely ambulatory  Filed Vitals:   01/06/11 1451  BP: 198/97  Pulse: 80  Temp: 97.5 F (36.4 C)    GENERAL:alert, no distress, well nourished, well developed, comfortable, cooperative and smiling SKIN: skin color, texture, turgor are normal, no rashes or significant lesions HEAD: Normocephalic, No masses, lesions, tenderness or abnormalities EYES: normal EARS: External ears normal OROPHARYNX:mucous membranes are moist  NECK: supple, no adenopathy, no bruits, thyroid normal size, non-tender, without nodularity, no stridor, non-tender, trachea midline LYMPH:  no palpable lymphadenopathy BREAST:not examined LUNGS: clear to auscultation and percussion HEART: regular rate & rhythm, no murmurs, no gallops, S1 normal and S2 normal ABDOMEN:abdomen soft, non-tender and normal bowel sounds BACK: Back symmetric, no curvature., No CVA tenderness EXTREMITIES:less then 2 second capillary refill, no joint deformities, effusion, or inflammation, no edema, no skin discoloration, no clubbing, no cyanosis  NEURO: alert & oriented x 3 with fluent speech, no focal motor/sensory deficits, gait normal   LABORATORY DATA: CBC    Component Value Date/Time   WBC 7.6 01/04/2011 0950   RBC 3.63* 01/04/2011 0950   HGB 11.8* 01/04/2011 0950   HCT 35.8* 01/04/2011 0950   PLT 223 01/04/2011 0950  MCV 98.6 01/04/2011 0950   MCH 32.5 01/04/2011 0950   MCHC 33.0 01/04/2011 0950   RDW 15.1 01/04/2011 0950   LYMPHSABS 1.7 01/04/2011 0950   MONOABS 0.6 01/04/2011 0950   EOSABS 0.1 01/04/2011 0950   BASOSABS 0.0 01/04/2011 0950       Chemistry      Component Value Date/Time   NA 139 12/19/2010 0902   K 3.5 12/19/2010 0902   CL 106 12/19/2010 0902   CO2 25 12/19/2010 0902   BUN 7 12/19/2010 0902   CREATININE 0.69 12/19/2010 0902      Component Value Date/Time   CALCIUM 9.4 12/19/2010 0902   ALKPHOS 49 12/19/2010 0902   AST 17 12/19/2010 0902   ALT 17 12/19/2010 0902   BILITOT 0.4 12/19/2010 0902     Lab Results  Component Value Date   CEA 2.1 01/04/2011    PATHOLOGY: 1. Liver FNA- metastatic adenocarcinoma    ASSESSMENT: 1. Recurrent metastatic adenocarcinoma of rectosigmoid junction  2. H/O Stage 1 adenocarcinoma of rectosigmoid junction.  3. Dry skin, improved  4. Grade 1 peripheral neuropathy. Stable   PLAN:  1. Prechemo lab work: CBC diff, CMET, UA 2. Chemotherapy day will be moved from Wednesday to [facility].  The patient prefer's Monday's.  3. Patient will continue as scheduled with chemotherapy. 4. I personally reviewed and went over laboratory results with the patient. 5. Will perform a PET scan following completion of 12 cycles of chemotherapy.  6. Return in 1 month for follow-up.  At that time, will order and schedule PET scan.    All questions were answered. The patient knows to call the clinic with any problems, questions or concerns. We can certainly see the patient much sooner if necessary.  The patient and plan discussed with Glenford Peers, MD and he is in agreement with the aforementioned.  KEFALAS,THOMAS

## 2011-01-10 ENCOUNTER — Other Ambulatory Visit (HOSPITAL_COMMUNITY): Payer: Medicare Other

## 2011-01-10 ENCOUNTER — Other Ambulatory Visit (HOSPITAL_COMMUNITY): Payer: Self-pay | Admitting: Oncology

## 2011-01-13 ENCOUNTER — Other Ambulatory Visit (HOSPITAL_COMMUNITY): Payer: Medicare Other

## 2011-01-16 ENCOUNTER — Other Ambulatory Visit (HOSPITAL_COMMUNITY): Payer: Self-pay | Admitting: Oncology

## 2011-01-16 ENCOUNTER — Encounter (HOSPITAL_BASED_OUTPATIENT_CLINIC_OR_DEPARTMENT_OTHER): Payer: Medicare Other

## 2011-01-16 VITALS — BP 145/81 | HR 76 | Temp 97.9°F | Ht 65.0 in | Wt 173.8 lb

## 2011-01-16 DIAGNOSIS — Z5111 Encounter for antineoplastic chemotherapy: Secondary | ICD-10-CM

## 2011-01-16 DIAGNOSIS — C19 Malignant neoplasm of rectosigmoid junction: Secondary | ICD-10-CM

## 2011-01-16 DIAGNOSIS — C189 Malignant neoplasm of colon, unspecified: Secondary | ICD-10-CM

## 2011-01-16 DIAGNOSIS — C787 Secondary malignant neoplasm of liver and intrahepatic bile duct: Secondary | ICD-10-CM

## 2011-01-16 LAB — COMPREHENSIVE METABOLIC PANEL
Albumin: 3.4 g/dL — ABNORMAL LOW (ref 3.5–5.2)
Alkaline Phosphatase: 53 U/L (ref 39–117)
BUN: 8 mg/dL (ref 6–23)
CO2: 27 mEq/L (ref 19–32)
Chloride: 102 mEq/L (ref 96–112)
Creatinine, Ser: 0.64 mg/dL (ref 0.50–1.10)
GFR calc Af Amer: >90 mL/min (ref >90)
GFR calc non Af Amer: 89 mL/min — ABNORMAL LOW (ref >90)
Glucose, Bld: 172 mg/dL — ABNORMAL HIGH (ref 70–99)
Potassium: 3.8 mEq/L (ref 3.5–5.1)
Total Bilirubin: 0.4 mg/dL (ref 0.3–1.2)

## 2011-01-16 LAB — CBC
HCT: 34.5 % — ABNORMAL LOW (ref 36.0–46.0)
Hemoglobin: 11.6 g/dL — ABNORMAL LOW (ref 12.0–15.0)
RBC: 3.54 MIL/uL — ABNORMAL LOW (ref 3.87–5.11)
WBC: 5.5 10*3/uL (ref 4.0–10.5)

## 2011-01-16 LAB — URINALYSIS, DIPSTICK ONLY
Bilirubin Urine: NEGATIVE
Leukocytes, UA: NEGATIVE
Nitrite: NEGATIVE
Specific Gravity, Urine: 1.025 (ref 1.005–1.030)
Urobilinogen, UA: 0.2 mg/dL (ref 0.0–1.0)

## 2011-01-16 LAB — DIFFERENTIAL
Lymphocytes Relative: 34 % (ref 12–46)
Lymphs Abs: 1.9 10*3/uL (ref 0.7–4.0)
Monocytes Absolute: 0.6 10*3/uL (ref 0.1–1.0)
Monocytes Relative: 10 % (ref 3–12)
Neutro Abs: 2.9 10*3/uL (ref 1.7–7.7)
Neutrophils Relative %: 53 % (ref 43–77)

## 2011-01-16 MED ORDER — DEXAMETHASONE SODIUM PHOSPHATE 10 MG/ML IJ SOLN: 10.0000 mg | Freq: Once | INTRAMUSCULAR | Status: DC

## 2011-01-16 MED ORDER — SODIUM CHLORIDE 0.9 % IV SOLN
Freq: Once | INTRAVENOUS | Status: AC
  Administered 2011-01-16: 500 mL via INTRAVENOUS

## 2011-01-16 MED ORDER — HEPARIN SOD (PORK) LOCK FLUSH 100 UNIT/ML IV SOLN
INTRAVENOUS | Status: AC
  Filled 2011-01-16: qty 5

## 2011-01-16 MED ORDER — SODIUM CHLORIDE 0.9 % IV SOLN
Freq: Once | INTRAVENOUS | Status: AC
  Administered 2011-01-16: 8 mg via INTRAVENOUS
  Filled 2011-01-16: qty 4

## 2011-01-16 MED ORDER — HEPARIN SOD (PORK) LOCK FLUSH 100 UNIT/ML IV SOLN
500.0000 [IU] | Freq: Once | INTRAVENOUS | Status: DC | PRN
  Filled 2011-01-16: qty 5

## 2011-01-16 MED ORDER — LEUCOVORIN CALCIUM INJECTION 100 MG
20.0000 mg/m2 | Freq: Once | INTRAMUSCULAR | Status: AC
  Administered 2011-01-16: 38 mg via INTRAVENOUS
  Filled 2011-01-16: qty 1.9

## 2011-01-16 MED ORDER — FLUOROURACIL CHEMO INJECTION 2.5 GM/50ML
400.0000 mg/m2 | Freq: Once | INTRAVENOUS | Status: AC
  Administered 2011-01-16: 750 mg via INTRAVENOUS
  Filled 2011-01-16: qty 15

## 2011-01-16 MED ORDER — SODIUM CHLORIDE 0.9 % IV SOLN
2400.0000 mg/m2 | INTRAVENOUS | Status: DC
  Administered 2011-01-16: 4650 mg via INTRAVENOUS
  Filled 2011-01-16 (×2): qty 93

## 2011-01-16 MED ORDER — LEUCOVORIN CALCIUM INJECTION 350 MG: 400.0000 mg/m2 | Freq: Once | INTRAVENOUS | Status: DC

## 2011-01-16 MED ORDER — SODIUM CHLORIDE 0.9 % IJ SOLN
INTRAMUSCULAR | Status: AC
  Administered 2011-01-16: 10 mL
  Filled 2011-01-16: qty 10

## 2011-01-16 MED ORDER — SODIUM CHLORIDE 0.9 % IJ SOLN
10.0000 mL | INTRAMUSCULAR | Status: DC | PRN
  Administered 2011-01-16: 10 mL
  Filled 2011-01-16: qty 10

## 2011-01-16 MED ORDER — DEXTROSE 5 % IV SOLN
50.0000 mL | INTRAVENOUS | Status: AC
  Administered 2011-01-16: 50 mL via INTRAVENOUS
  Filled 2011-01-16 (×2): qty 50

## 2011-01-16 MED ORDER — SODIUM CHLORIDE 0.9 % IV SOLN: 8.0000 mg | Freq: Once | INTRAVENOUS | Status: DC

## 2011-01-16 MED ORDER — OXALIPLATIN CHEMO INJECTION 100 MG/20ML
85.0000 mg/m2 | Freq: Once | INTRAVENOUS | Status: AC
  Administered 2011-01-16: 165 mg via INTRAVENOUS
  Filled 2011-01-16: qty 33

## 2011-01-16 MED ORDER — SODIUM CHLORIDE 0.9 % IV SOLN
5.0000 mg/kg | Freq: Once | INTRAVENOUS | Status: AC
  Administered 2011-01-16: 425 mg via INTRAVENOUS
  Filled 2011-01-16: qty 17

## 2011-01-16 NOTE — Progress Notes (Signed)
Tolerated chemo well. 

## 2011-01-16 NOTE — Patient Instructions (Addendum)
[facility] Discharge Instructions for Patients Receiving Chemotherapy  Today you received the following chemotherapy agents Avastin, oxaliplatin and leucovorin, 80fu.  To help prevent nausea and vomiting after your treatment, we encourage you to take your nausea medication.  Zofran--- Take 1 tablet two times a day starting the day after chemo for 2 days. Then take 1 tablet two times a day as needed for nausea or vomiting. Lorazepam or ativanTake 1 tablet (0.5 mg total) by mouth every 6 (six) hours as needed (Nausea or vomiting). Dexamethasone--Take 2 tablets by mouth daily starting the day after chemotherapy for 2 days. Take with food.   Return on Wednesday to remove pump.    If you develop nausea and vomiting that is not controlled by your nausea medication, call the clinic. If it is after clinic hours your family physician or the after hours number for the clinic or go to the Emergency Department. We will call in norvasc 5 mg take 1 twice a day. [facility]).  Record your blood pressure twice a day.  BELOW ARE SYMPTOMS THAT SHOULD BE REPORTED IMMEDIATELY:  *FEVER GREATER THAN 101.0 F  *CHILLS WITH OR WITHOUT FEVER  NAUSEA AND VOMITING THAT IS NOT CONTROLLED WITH YOUR NAUSEA MEDICATION  *UNUSUAL SHORTNESS OF BREATH  *UNUSUAL BRUISING OR BLEEDING  TENDERNESS IN MOUTH AND THROAT WITH OR WITHOUT PRESENCE OF ULCERS  *URINARY PROBLEMS  *BOWEL PROBLEMS  UNUSUAL RASH Items with * indicate a potential emergency and should be followed up as soon as possible.  One of the nurses will contact you 24 hours after your treatment. Please let the nurse know about any problems that you may have experienced. Feel free to call the clinic you have any questions or concerns. The clinic phone number is [phone].   I have been informed and understand all the instructions given to me. I know to contact the clinic, my physician, or go to the Emergency Department if any  problems should occur. I do not have any questions at this time, but understand that I may call the clinic during office hours or the Patient Navigator at [phone] should I have any questions or need assistance in obtaining follow up care.    __________________________________________  _____________  __________ Signature of Patient or Authorized Representative            Date                   Time    __________________________________________ Nurse's Signature

## 2011-01-18 ENCOUNTER — Encounter (HOSPITAL_BASED_OUTPATIENT_CLINIC_OR_DEPARTMENT_OTHER): Payer: Medicare Other

## 2011-01-18 DIAGNOSIS — C19 Malignant neoplasm of rectosigmoid junction: Secondary | ICD-10-CM

## 2011-01-18 DIAGNOSIS — C787 Secondary malignant neoplasm of liver and intrahepatic bile duct: Secondary | ICD-10-CM

## 2011-01-18 DIAGNOSIS — Z452 Encounter for adjustment and management of vascular access device: Secondary | ICD-10-CM

## 2011-01-18 DIAGNOSIS — C189 Malignant neoplasm of colon, unspecified: Secondary | ICD-10-CM

## 2011-01-18 MED ORDER — HEPARIN SOD (PORK) LOCK FLUSH 100 UNIT/ML IV SOLN
500.0000 [IU] | Freq: Once | INTRAVENOUS | Status: AC
  Administered 2011-01-18: 500 [IU] via INTRAVENOUS
  Filled 2011-01-18: qty 5

## 2011-01-18 MED ORDER — SODIUM CHLORIDE 0.9 % IJ SOLN
10.0000 mL | INTRAMUSCULAR | Status: DC | PRN
  Administered 2011-01-18: 10 mL via INTRAVENOUS
  Filled 2011-01-18: qty 10

## 2011-01-18 MED ORDER — HEPARIN SOD (PORK) LOCK FLUSH 100 UNIT/ML IV SOLN
INTRAVENOUS | Status: AC
  Filled 2011-01-18: qty 5

## 2011-01-18 MED ORDER — SODIUM CHLORIDE 0.9 % IJ SOLN
INTRAMUSCULAR | Status: AC
  Filled 2011-01-18: qty 10

## 2011-01-24 ENCOUNTER — Other Ambulatory Visit (HOSPITAL_COMMUNITY): Payer: Medicare Other

## 2011-01-27 ENCOUNTER — Other Ambulatory Visit (HOSPITAL_COMMUNITY): Payer: Medicare Other

## 2011-01-30 ENCOUNTER — Encounter (HOSPITAL_BASED_OUTPATIENT_CLINIC_OR_DEPARTMENT_OTHER): Payer: Medicare Other

## 2011-01-30 DIAGNOSIS — C19 Malignant neoplasm of rectosigmoid junction: Secondary | ICD-10-CM

## 2011-01-30 DIAGNOSIS — C787 Secondary malignant neoplasm of liver and intrahepatic bile duct: Secondary | ICD-10-CM

## 2011-01-30 DIAGNOSIS — Z5111 Encounter for antineoplastic chemotherapy: Secondary | ICD-10-CM

## 2011-01-30 DIAGNOSIS — C189 Malignant neoplasm of colon, unspecified: Secondary | ICD-10-CM

## 2011-01-30 LAB — DIFFERENTIAL
Basophils Absolute: 0 10*3/uL (ref 0.0–0.1)
Basophils Relative: 1 % (ref 0–1)
Eosinophils Relative: 2 % (ref 0–5)
Monocytes Absolute: 0.6 10*3/uL (ref 0.1–1.0)

## 2011-01-30 LAB — COMPREHENSIVE METABOLIC PANEL
AST: 17 U/L (ref 0–37)
Albumin: 3.4 g/dL — ABNORMAL LOW (ref 3.5–5.2)
Calcium: 9.6 mg/dL (ref 8.4–10.5)
Creatinine, Ser: 0.66 mg/dL (ref 0.50–1.10)
Sodium: 138 mEq/L (ref 135–145)
Total Protein: 6.5 g/dL (ref 6.0–8.3)

## 2011-01-30 LAB — URINALYSIS, DIPSTICK ONLY
Bilirubin Urine: NEGATIVE
Glucose, UA: 100 mg/dL — AB
Hgb urine dipstick: NEGATIVE
Ketones, ur: NEGATIVE mg/dL
Leukocytes, UA: NEGATIVE
pH: 5.5 (ref 5.0–8.0)

## 2011-01-30 LAB — CBC
HCT: 35.3 % — ABNORMAL LOW (ref 36.0–46.0)
MCHC: 33.4 g/dL (ref 30.0–36.0)
MCV: 97.8 fL (ref 78.0–100.0)
Platelets: 193 10*3/uL (ref 150–400)
RDW: 14.8 % (ref 11.5–15.5)

## 2011-01-30 MED ORDER — SODIUM CHLORIDE 0.9 % IJ SOLN
10.0000 mL | INTRAMUSCULAR | Status: DC | PRN
  Administered 2011-01-30: 10 mL
  Filled 2011-01-30: qty 10

## 2011-01-30 MED ORDER — SODIUM CHLORIDE 0.9 % IV SOLN
Freq: Once | INTRAVENOUS | Status: AC
  Administered 2011-01-30: 8 mg via INTRAVENOUS
  Filled 2011-01-30: qty 4

## 2011-01-30 MED ORDER — SODIUM CHLORIDE 0.9 % IV SOLN
5.0000 mg/kg | Freq: Once | INTRAVENOUS | Status: AC
  Administered 2011-01-30: 425 mg via INTRAVENOUS
  Filled 2011-01-30: qty 17

## 2011-01-30 MED ORDER — OXALIPLATIN CHEMO INJECTION 100 MG/20ML
85.0000 mg/m2 | Freq: Once | INTRAVENOUS | Status: AC
  Administered 2011-01-30: 165 mg via INTRAVENOUS
  Filled 2011-01-30 (×3): qty 33

## 2011-01-30 MED ORDER — LEUCOVORIN CALCIUM INJECTION 350 MG: 400.0000 mg/m2 | Freq: Once | INTRAVENOUS | Status: DC

## 2011-01-30 MED ORDER — SODIUM CHLORIDE 0.9 % IV SOLN
2400.0000 mg/m2 | INTRAVENOUS | Status: DC
  Administered 2011-01-30: 4650 mg via INTRAVENOUS
  Filled 2011-01-30 (×2): qty 93

## 2011-01-30 MED ORDER — DEXAMETHASONE SODIUM PHOSPHATE 10 MG/ML IJ SOLN: 10.0000 mg | Freq: Once | INTRAMUSCULAR | Status: DC

## 2011-01-30 MED ORDER — DEXTROSE 5 % IV SOLN
50.0000 mL | INTRAVENOUS | Status: AC
  Administered 2011-01-30: 50 mL via INTRAVENOUS
  Filled 2011-01-30 (×2): qty 50

## 2011-01-30 MED ORDER — SODIUM CHLORIDE 0.9 % IV SOLN: 8.0000 mg | Freq: Once | INTRAVENOUS | Status: DC

## 2011-01-30 MED ORDER — FLUOROURACIL CHEMO INJECTION 2.5 GM/50ML
400.0000 mg/m2 | Freq: Once | INTRAVENOUS | Status: AC
  Administered 2011-01-30: 750 mg via INTRAVENOUS
  Filled 2011-01-30: qty 15

## 2011-01-30 MED ORDER — LEUCOVORIN CALCIUM INJECTION 100 MG
20.0000 mg/m2 | Freq: Once | INTRAMUSCULAR | Status: AC
  Administered 2011-01-30: 38 mg via INTRAVENOUS
  Filled 2011-01-30 (×2): qty 1.9

## 2011-01-30 MED ORDER — SODIUM CHLORIDE 0.9 % IJ SOLN
INTRAMUSCULAR | Status: AC
  Administered 2011-01-30: 10 mL
  Filled 2011-01-30: qty 10

## 2011-01-30 MED ORDER — SODIUM CHLORIDE 0.9 % IV SOLN
Freq: Once | INTRAVENOUS | Status: AC
  Administered 2011-01-30: 10:00:00 via INTRAVENOUS

## 2011-01-30 NOTE — Progress Notes (Signed)
Tolerated chemo well. 

## 2011-02-01 ENCOUNTER — Encounter (HOSPITAL_COMMUNITY): Payer: Medicare Other

## 2011-02-01 ENCOUNTER — Encounter (HOSPITAL_BASED_OUTPATIENT_CLINIC_OR_DEPARTMENT_OTHER): Payer: Medicare Other | Admitting: Oncology

## 2011-02-01 VITALS — Wt 171.3 lb

## 2011-02-01 DIAGNOSIS — C787 Secondary malignant neoplasm of liver and intrahepatic bile duct: Secondary | ICD-10-CM

## 2011-02-01 DIAGNOSIS — C189 Malignant neoplasm of colon, unspecified: Secondary | ICD-10-CM

## 2011-02-01 MED ORDER — SODIUM CHLORIDE 0.9 % IJ SOLN
INTRAMUSCULAR | Status: AC
  Filled 2011-02-01: qty 10

## 2011-02-01 MED ORDER — HEPARIN SOD (PORK) LOCK FLUSH 100 UNIT/ML IV SOLN
INTRAVENOUS | Status: AC
  Filled 2011-02-01: qty 5

## 2011-02-01 NOTE — Patient Instructions (Signed)
[name]  [id number] September 19, 1940   [facility]  Discharge Instructions  RECOMMENDATIONS MADE BY THE CONSULTANT AND ANY TEST RESULTS WILL BE SENT TO YOUR REFERRING DOCTOR.   EXAM FINDINGS BY MD TODAY AND SIGNS AND SYMPTOMS TO REPORT TO CLINIC OR PRIMARY MD: You are doing well.  Your tumor marker is stable.  Report any uncontrolled nausea, vomiting, diarrhea, fevers, infections, etc.  MEDICATIONS PRESCRIBED: none     SPECIAL INSTRUCTIONS/FOLLOW-UP: Return to Clinic for chemotherapy as scheduled, to see PA in 1 month and to see Dr. Mariel Sleet in 2 months.   I acknowledge that I have been informed and understand all the instructions given to me and received a copy. I do not have any more questions at this time, but understand that I may call the Specialty Clinic at [facility] at [phone] during business hours should I have any further questions or need assistance in obtaining follow-up care.    __________________________________________  _____________  __________ Signature of Patient or Authorized Representative            Date                   Time    __________________________________________ Nurse's Signature

## 2011-02-01 NOTE — Progress Notes (Signed)
Anita Hector, MD, MD [address] [place] [address]  1. Colon adenocarcinoma  CBC, Differential, Comprehensive metabolic panel, CEA, Urinalysis, dipstick only    CURRENT THERAPY:S/P 8 cycles of FOLFOX + Avastin chemotherapy. S/P liver ablation for metastatic, recurrent disease.   INTERVAL HISTORY: [name] 71 y.o. female returns for  regular  visit for followup of metastatic, recurrent disease.   The patient continues to tolerate therapy well.  She denies any nausea and vomiting.  She does report some weakness and fatigue following therapy, but is her usual.  It is no worse this cycle.    I personally reviewed and went over laboratory results with the patient. She was worried about her CEA increasing slightly from 2 to 3, but I educated the patient that these fluctuations will occur particularly with therapy.    Otherwise, the patient is looking forward to completion of therapy.  A PET scan will be performed following completion of chemotherapy by Dr. Miles Costain or the [facility].   Past Medical History  Diagnosis Date  . Colon cancer     liver ca  . HTN (hypertension)   . Thyroid condition   . Diabetes mellitus   . Shingles   . Acute UTI     has Colon adenocarcinoma and Colon cancer on her problem list.      has no known allergies.  [name] had no medications administered during this visit.  Past Surgical History  Procedure Date  . Colectomy     and bil. oopherectomy  . Appendectomy   . Blt   . Resection liver partial / total w/ radiofrequency ablation   . Port-a-cath removal   . Portacath placement 09/2010    Denies any headaches, dizziness, double vision, fevers, chills, night sweats, nausea, vomiting, diarrhea, constipation, chest pain, heart palpitations, shortness of breath, blood in stool, black tarry stool, urinary pain, urinary burning, urinary frequency, hematuria.   PHYSICAL EXAMINATION  ECOG PERFORMANCE STATUS: 1 - Symptomatic  but completely ambulatory  There were no vitals filed for this visit.  GENERAL:alert, no distress, well nourished, well developed, comfortable, cooperative and smiling SKIN: skin color, texture, turgor are normal, no rashes or significant lesions HEAD: Normocephalic, No masses, lesions, tenderness or abnormalities EYES: normal EARS: External ears normal OROPHARYNX:mucous membranes are moist  NECK: supple, no adenopathy, no bruits, thyroid normal size, non-tender, without nodularity, no stridor, non-tender, trachea midline LYMPH:  no palpable lymphadenopathy BREAST:not examined LUNGS: clear to auscultation and percussion HEART: regular rate & rhythm, no murmurs, no gallops, S1 normal and S2 normal ABDOMEN:abdomen soft, non-tender, normal bowel sounds and no masses or organomegaly BACK: Back symmetric, no curvature., No CVA tenderness EXTREMITIES:less then 2 second capillary refill, no joint deformities, effusion, or inflammation, no edema, no skin discoloration, no clubbing, no cyanosis  NEURO: alert & oriented x 3 with fluent speech, no focal motor/sensory deficits, gait normal   LABORATORY DATA: CBC    Component Value Date/Time   WBC 5.0 01/30/2011 0813   RBC 3.61* 01/30/2011 0813   HGB 11.8* 01/30/2011 0813   HCT 35.3* 01/30/2011 0813   PLT 193 01/30/2011 0813   MCV 97.8 01/30/2011 0813   MCH 32.7 01/30/2011 0813   MCHC 33.4 01/30/2011 0813   RDW 14.8 01/30/2011 0813   LYMPHSABS 1.7 01/30/2011 0813   MONOABS 0.6 01/30/2011 0813   EOSABS 0.1 01/30/2011 0813   BASOSABS 0.0 01/30/2011 0813      Chemistry      Component Value  Date/Time   NA 138 01/30/2011 0813   K 3.6 01/30/2011 0813   CL 103 01/30/2011 0813   CO2 23 01/30/2011 0813   BUN 9 01/30/2011 0813   CREATININE 0.66 01/30/2011 0813      Component Value Date/Time   CALCIUM 9.6 01/30/2011 0813   ALKPHOS 54 01/30/2011 0813   AST 17 01/30/2011 0813   ALT 17 01/30/2011 0813   BILITOT 0.4 01/30/2011 0813     Results for [name], [name] (MRN [id number]) as of 02/01/2011 16:29  Ref. Range 10/03/2010 10:53 10/31/2010 08:30 12/05/2010 09:30 01/04/2011 10:30 01/30/2011 08:13  CEA Latest Range: 0.0-5.0 ng/mL 5.0 4.1 3.0 2.1 3.3     PATHOLOGY: 1. 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 H/O Stage 1 adenocarcinoma of rectosigmoid junction.  3. Dry skin, improved  4. Grade 1 peripheral neuropathy. Stable   PLAN:  1. Pre-chemo lab work ordered as standing order. 2. Continue on with therapy as anticipated 3. I personally reviewed and went over laboratory results with the patient. 4. A PET scan will be performed following completion of 12 cycles of chemotherapy by Dr. Miles Costain or the [facility]. 5. Return in 1 and 2 months for follow-up.   All questions were answered. The patient knows to call the clinic with any problems, questions or concerns. We can certainly see the patient much sooner if necessary.  The patient and plan discussed with Glenford Peers, MD and he is in agreement with the aforementioned.  I spent 15 minutes counseling the patient face to face. The total time spent in the appointment was 25 minutes.  [name]

## 2011-02-13 ENCOUNTER — Other Ambulatory Visit (HOSPITAL_COMMUNITY): Payer: Self-pay | Admitting: Oncology

## 2011-02-13 ENCOUNTER — Encounter (HOSPITAL_COMMUNITY): Payer: Medicare Other | Attending: Oncology

## 2011-02-13 VITALS — BP 148/89 | HR 94 | Temp 98.2°F | Ht 65.0 in | Wt 169.5 lb

## 2011-02-13 DIAGNOSIS — C787 Secondary malignant neoplasm of liver and intrahepatic bile duct: Secondary | ICD-10-CM

## 2011-02-13 DIAGNOSIS — C19 Malignant neoplasm of rectosigmoid junction: Secondary | ICD-10-CM

## 2011-02-13 DIAGNOSIS — C189 Malignant neoplasm of colon, unspecified: Secondary | ICD-10-CM | POA: Insufficient documentation

## 2011-02-13 LAB — URINALYSIS, DIPSTICK ONLY
Leukocytes, UA: NEGATIVE
Nitrite: NEGATIVE
Protein, ur: 30 mg/dL — AB

## 2011-02-13 LAB — DIFFERENTIAL
Basophils Absolute: 0 10*3/uL (ref 0.0–0.1)
Lymphocytes Relative: 48 % — ABNORMAL HIGH (ref 12–46)
Monocytes Relative: 18 % — ABNORMAL HIGH (ref 3–12)
Neutrophils Relative %: 29 % — ABNORMAL LOW (ref 43–77)

## 2011-02-13 LAB — COMPREHENSIVE METABOLIC PANEL
ALT: 14 U/L (ref 0–35)
AST: 16 U/L (ref 0–37)
Albumin: 3.3 g/dL — ABNORMAL LOW (ref 3.5–5.2)
CO2: 24 mEq/L (ref 19–32)
Chloride: 104 mEq/L (ref 96–112)
GFR calc non Af Amer: 86 mL/min — ABNORMAL LOW (ref >90)
Potassium: 3.5 mEq/L (ref 3.5–5.1)
Sodium: 140 mEq/L (ref 135–145)
Total Bilirubin: 0.6 mg/dL (ref 0.3–1.2)

## 2011-02-13 LAB — CBC
HCT: 35.9 % — ABNORMAL LOW (ref 36.0–46.0)
Platelets: 180 10*3/uL (ref 150–400)
RDW: 14.6 % (ref 11.5–15.5)
WBC: 3 10*3/uL — ABNORMAL LOW (ref 4.0–10.5)

## 2011-02-13 MED ORDER — HEPARIN SOD (PORK) LOCK FLUSH 100 UNIT/ML IV SOLN
500.0000 [IU] | Freq: Once | INTRAVENOUS | Status: AC | PRN
  Administered 2011-02-13: 500 [IU]
  Filled 2011-02-13: qty 5

## 2011-02-13 MED ORDER — SODIUM CHLORIDE 0.9 % IJ SOLN
INTRAMUSCULAR | Status: AC
  Administered 2011-02-13: 10 mL via INTRAVENOUS
  Filled 2011-02-13: qty 10

## 2011-02-13 NOTE — Progress Notes (Signed)
Chemo held today due to low counts.  Will reschedule for next Monday.

## 2011-02-15 ENCOUNTER — Encounter (HOSPITAL_COMMUNITY): Payer: Medicare Other

## 2011-02-17 ENCOUNTER — Other Ambulatory Visit (HOSPITAL_COMMUNITY): Payer: Self-pay | Admitting: Oncology

## 2011-02-20 ENCOUNTER — Encounter (HOSPITAL_BASED_OUTPATIENT_CLINIC_OR_DEPARTMENT_OTHER): Payer: Medicare Other

## 2011-02-20 DIAGNOSIS — C7800 Secondary malignant neoplasm of unspecified lung: Secondary | ICD-10-CM

## 2011-02-20 DIAGNOSIS — Z5111 Encounter for antineoplastic chemotherapy: Secondary | ICD-10-CM

## 2011-02-20 DIAGNOSIS — C19 Malignant neoplasm of rectosigmoid junction: Secondary | ICD-10-CM

## 2011-02-20 DIAGNOSIS — C189 Malignant neoplasm of colon, unspecified: Secondary | ICD-10-CM

## 2011-02-20 LAB — DIFFERENTIAL
Basophils Absolute: 0 10*3/uL (ref 0.0–0.1)
Basophils Relative: 1 % (ref 0–1)
Monocytes Absolute: 0.7 10*3/uL (ref 0.1–1.0)
Neutro Abs: 2 10*3/uL (ref 1.7–7.7)

## 2011-02-20 LAB — CBC
HCT: 35.8 % — ABNORMAL LOW (ref 36.0–46.0)
MCHC: 33.2 g/dL (ref 30.0–36.0)
Platelets: 242 10*3/uL (ref 150–400)
RDW: 14.6 % (ref 11.5–15.5)
WBC: 4.6 10*3/uL (ref 4.0–10.5)

## 2011-02-20 MED ORDER — DEXTROSE 5 % IV SOLN
50.0000 mL | INTRAVENOUS | Status: AC
  Filled 2011-02-20 (×2): qty 50

## 2011-02-20 MED ORDER — LORAZEPAM 2 MG/ML IJ SOLN
0.5000 mg | Freq: Once | INTRAMUSCULAR | Status: AC
  Administered 2011-02-20: 0.5 mg via INTRAVENOUS

## 2011-02-20 MED ORDER — SODIUM CHLORIDE 0.9 % IV SOLN
Freq: Once | INTRAVENOUS | Status: AC
  Administered 2011-02-20: 8 mg via INTRAVENOUS
  Filled 2011-02-20: qty 4

## 2011-02-20 MED ORDER — OXALIPLATIN CHEMO INJECTION 100 MG/20ML
72.2500 mg/m2 | Freq: Once | INTRAVENOUS | Status: AC
  Administered 2011-02-20: 140 mg via INTRAVENOUS
  Filled 2011-02-20: qty 28

## 2011-02-20 MED ORDER — DEXAMETHASONE SODIUM PHOSPHATE 10 MG/ML IJ SOLN: 10.0000 mg | Freq: Once | INTRAMUSCULAR | Status: DC

## 2011-02-20 MED ORDER — HEPARIN SOD (PORK) LOCK FLUSH 100 UNIT/ML IV SOLN
500.0000 [IU] | Freq: Once | INTRAVENOUS | Status: DC | PRN
  Filled 2011-02-20: qty 5

## 2011-02-20 MED ORDER — FLUOROURACIL CHEMO INJECTION 2.5 GM/50ML
340.0000 mg/m2 | Freq: Once | INTRAVENOUS | Status: AC
  Administered 2011-02-20: 650 mg via INTRAVENOUS
  Filled 2011-02-20: qty 13

## 2011-02-20 MED ORDER — SODIUM CHLORIDE 0.9 % IV SOLN
5.0000 mg/kg | Freq: Once | INTRAVENOUS | Status: AC
  Administered 2011-02-20: 425 mg via INTRAVENOUS
  Filled 2011-02-20: qty 17

## 2011-02-20 MED ORDER — DEXTROSE 5 % IV SOLN
Freq: Once | INTRAVENOUS | Status: AC
  Administered 2011-02-20: 12:00:00 via INTRAVENOUS
  Filled 2011-02-20: qty 1000

## 2011-02-20 MED ORDER — SODIUM CHLORIDE 0.9 % IV SOLN: 8.0000 mg | Freq: Once | INTRAVENOUS | Status: DC

## 2011-02-20 MED ORDER — LORAZEPAM 2 MG/ML IJ SOLN
INTRAMUSCULAR | Status: AC
  Administered 2011-02-20: 0.5 mg via INTRAVENOUS
  Filled 2011-02-20: qty 1

## 2011-02-20 MED ORDER — SODIUM CHLORIDE 0.9 % IV SOLN
Freq: Once | INTRAVENOUS | Status: AC
  Administered 2011-02-20: 10:00:00 via INTRAVENOUS

## 2011-02-20 MED ORDER — FLUOROURACIL CHEMO INJECTION 5 GM/100ML
2040.0000 mg/m2 | INTRAVENOUS | Status: DC
  Administered 2011-02-20: 3950 mg via INTRAVENOUS
  Filled 2011-02-20 (×2): qty 79

## 2011-02-20 MED ORDER — LEUCOVORIN CALCIUM INJECTION 350 MG
20.0000 mg/m2 | Freq: Once | INTRAMUSCULAR | Status: AC
  Administered 2011-02-20: 38 mg via INTRAVENOUS
  Filled 2011-02-20: qty 1.9

## 2011-02-22 ENCOUNTER — Encounter (HOSPITAL_BASED_OUTPATIENT_CLINIC_OR_DEPARTMENT_OTHER): Payer: Medicare Other

## 2011-02-22 DIAGNOSIS — Z452 Encounter for adjustment and management of vascular access device: Secondary | ICD-10-CM

## 2011-02-22 DIAGNOSIS — C19 Malignant neoplasm of rectosigmoid junction: Secondary | ICD-10-CM

## 2011-02-22 DIAGNOSIS — C189 Malignant neoplasm of colon, unspecified: Secondary | ICD-10-CM

## 2011-02-22 MED ORDER — HEPARIN SOD (PORK) LOCK FLUSH 100 UNIT/ML IV SOLN
INTRAVENOUS | Status: AC
  Administered 2011-02-22: 500 [IU]
  Filled 2011-02-22: qty 5

## 2011-02-22 MED ORDER — SODIUM CHLORIDE 0.9 % IJ SOLN
INTRAMUSCULAR | Status: AC
  Administered 2011-02-22: 10 mL
  Filled 2011-02-22: qty 10

## 2011-02-22 MED ORDER — SODIUM CHLORIDE 0.9 % IJ SOLN
10.0000 mL | INTRAMUSCULAR | Status: DC | PRN
  Administered 2011-02-22: 10 mL

## 2011-02-22 MED ORDER — HEPARIN SOD (PORK) LOCK FLUSH 100 UNIT/ML IV SOLN
500.0000 [IU] | Freq: Once | INTRAVENOUS | Status: AC | PRN
  Administered 2011-02-22: 500 [IU]

## 2011-02-23 ENCOUNTER — Encounter (HOSPITAL_COMMUNITY): Payer: Medicare Other

## 2011-02-27 ENCOUNTER — Inpatient Hospital Stay (HOSPITAL_COMMUNITY): Payer: Medicare Other

## 2011-03-01 ENCOUNTER — Encounter (HOSPITAL_COMMUNITY): Payer: Medicare Other

## 2011-03-02 ENCOUNTER — Ambulatory Visit (HOSPITAL_COMMUNITY): Payer: Medicare Other | Admitting: Oncology

## 2011-03-06 ENCOUNTER — Encounter (HOSPITAL_BASED_OUTPATIENT_CLINIC_OR_DEPARTMENT_OTHER): Payer: Medicare Other | Admitting: Oncology

## 2011-03-06 ENCOUNTER — Encounter (HOSPITAL_COMMUNITY): Payer: Medicare Other | Attending: Oncology

## 2011-03-06 ENCOUNTER — Encounter (HOSPITAL_COMMUNITY): Payer: Medicare Other

## 2011-03-06 DIAGNOSIS — Z5111 Encounter for antineoplastic chemotherapy: Secondary | ICD-10-CM

## 2011-03-06 DIAGNOSIS — C19 Malignant neoplasm of rectosigmoid junction: Secondary | ICD-10-CM

## 2011-03-06 DIAGNOSIS — C787 Secondary malignant neoplasm of liver and intrahepatic bile duct: Secondary | ICD-10-CM

## 2011-03-06 DIAGNOSIS — C189 Malignant neoplasm of colon, unspecified: Secondary | ICD-10-CM | POA: Insufficient documentation

## 2011-03-06 LAB — CBC
MCH: 33 pg (ref 26.0–34.0)
MCHC: 32.9 g/dL (ref 30.0–36.0)
Platelets: 231 10*3/uL (ref 150–400)
RDW: 14 % (ref 11.5–15.5)

## 2011-03-06 LAB — DIFFERENTIAL
Basophils Relative: 1 % (ref 0–1)
Eosinophils Absolute: 0.1 10*3/uL (ref 0.0–0.7)
Neutrophils Relative %: 54 % (ref 43–77)

## 2011-03-06 LAB — URINALYSIS, DIPSTICK ONLY
Leukocytes, UA: NEGATIVE
Nitrite: NEGATIVE
Specific Gravity, Urine: >1.03 — ABNORMAL HIGH (ref 1.005–1.030)
pH: 5.5 (ref 5.0–8.0)

## 2011-03-06 MED ORDER — DEXTROSE 5 % IV SOLN
Freq: Once | INTRAVENOUS | Status: AC
  Administered 2011-03-06: 15:00:00 via INTRAVENOUS
  Filled 2011-03-06: qty 1000

## 2011-03-06 MED ORDER — ONDANSETRON HCL 4 MG/2ML IJ SOLN
Freq: Once | INTRAMUSCULAR | Status: AC
  Administered 2011-03-06: 8 mg via INTRAVENOUS
  Filled 2011-03-06: qty 4

## 2011-03-06 MED ORDER — LEUCOVORIN CALCIUM INJECTION 350 MG: 400.0000 mg/m2 | Freq: Once | INTRAVENOUS | Status: DC

## 2011-03-06 MED ORDER — SODIUM CHLORIDE 0.9 % IV SOLN: 8.0000 mg | Freq: Once | INTRAVENOUS | Status: DC

## 2011-03-06 MED ORDER — OXALIPLATIN CHEMO INJECTION 100 MG/20ML
72.2500 mg/m2 | Freq: Once | INTRAVENOUS | Status: AC
  Administered 2011-03-06: 140 mg via INTRAVENOUS
  Filled 2011-03-06: qty 28

## 2011-03-06 MED ORDER — SODIUM CHLORIDE 0.9 % IV SOLN
Freq: Once | INTRAVENOUS | Status: AC
  Administered 2011-03-06: 10:00:00 via INTRAVENOUS

## 2011-03-06 MED ORDER — SODIUM CHLORIDE 0.9 % IV SOLN
2040.0000 mg/m2 | INTRAVENOUS | Status: DC
  Administered 2011-03-06: 3950 mg via INTRAVENOUS
  Filled 2011-03-06 (×2): qty 79

## 2011-03-06 MED ORDER — FLUOROURACIL CHEMO INJECTION 2.5 GM/50ML
340.0000 mg/m2 | Freq: Once | INTRAVENOUS | Status: AC
  Administered 2011-03-06: 650 mg via INTRAVENOUS
  Filled 2011-03-06: qty 13

## 2011-03-06 MED ORDER — BEVACIZUMAB CHEMO INJECTION 400 MG/16ML
5.0000 mg/kg | Freq: Once | INTRAVENOUS | Status: AC
  Administered 2011-03-06: 425 mg via INTRAVENOUS
  Filled 2011-03-06: qty 17

## 2011-03-06 MED ORDER — LEUCOVORIN CALCIUM INJECTION 100 MG
38.0000 mg | Freq: Once | INTRAMUSCULAR | Status: AC
  Administered 2011-03-06: 38 mg via INTRAVENOUS
  Filled 2011-03-06: qty 1.9

## 2011-03-06 MED ORDER — DEXTROSE 5 % IV BOLUS
50.0000 mL | INTRAVENOUS | Status: AC
  Administered 2011-03-06: 50 mL via INTRAVENOUS
  Filled 2011-03-06 (×2): qty 50

## 2011-03-06 MED ORDER — DEXAMETHASONE SODIUM PHOSPHATE 10 MG/ML IJ SOLN: 10.0000 mg | Freq: Once | INTRAMUSCULAR | Status: DC

## 2011-03-06 NOTE — Progress Notes (Signed)
[name], MD, MD [address] [place] [address]  1. Colon adenocarcinoma     CURRENT THERAPY: Presently undergoing Cycle 10.  S/P 9 cycles of FOLFOX + Avastin chemotherapy. S/P liver ablation for metastatic, recurrent disease.   INTERVAL HISTORY: [name] 71 y.o. female returns for  regular  visit for followup of metastatic, recurrent disease.   She is doing very well. She only admits to some fatigue which is expected.  It is only a grade 1 fatigue.  This does not prevent her from performing any ADLs and activities she wishes to perform.    She also admits to minimal nausea.  It is so mild that she does not take any anti-emetics for this.  She reports that she rests and the nausea passes on its own.  She does have anti-emetics at home if needed.   She is presently undergoing cycle 10 of chemotherapy.  She is scheduled to undergo 12 cycles total of chemotherapy.  Following chemotherapy, she is to have restaging scans.  She reports that Dr. Miles Costain will manage this.  She will call him in 1 month to let him know when she is scheduled to complete therapy.  Presently, she is scheduled to complete cycle 12 on 04/03/2011.  Unfortunately, her husband has a head cold.  I have stressed good hand washing techniques.   ROS: No TIA's or unusual headaches, no dysphagia.  No prolonged cough. No dyspnea or chest pain on exertion.  No abdominal pain, change in bowel habits, black or bloody stools.  No urinary tract symptoms.  No new or unusual musculoskeletal symptoms.  Normal menses, no abnormal vaginal bleeding, discharge or unexpected pelvic pain. No new breast lumps, breast pain or nipple discharge.   Past Medical History  Diagnosis Date  . Colon cancer     liver ca  . HTN (hypertension)   . Thyroid condition   . Diabetes mellitus   . Shingles   . Acute UTI     has Colon adenocarcinoma and Colon cancer on her problem list.      has no known allergies.  [name] had no  medications administered during this visit.  Past Surgical History  Procedure Date  . Colectomy     and bil. oopherectomy  . Appendectomy   . Blt   . Resection liver partial / total w/ radiofrequency ablation   . Port-a-cath removal   . Portacath placement 09/2010    Denies any headaches, dizziness, double vision, fevers, chills, night sweats, nausea, vomiting, diarrhea, constipation, chest pain, heart palpitations, shortness of breath, blood in stool, black tarry stool, urinary pain, urinary burning, urinary frequency, hematuria.   PHYSICAL EXAMINATION  ECOG PERFORMANCE STATUS: 1 - Symptomatic but completely ambulatory  There were no vitals filed for this visit.  GENERAL:alert, no distress, well nourished, well developed, comfortable, cooperative, obese and smiling SKIN: skin color, texture, turgor are normal, no rashes or significant lesions HEAD: Normocephalic EYES: normal EARS: External ears normal OROPHARYNX:mucous membranes are moist  NECK: supple, trachea midline LYMPH:  no palpable lymphadenopathy BREAST:not examined LUNGS: clear to auscultation and percussion HEART: regular rate & rhythm, no murmurs, no gallops, S1 normal and S2 normal ABDOMEN:abdomen soft, non-tender and normal bowel sounds BACK: Back symmetric, no curvature. EXTREMITIES:less then 2 second capillary refill, no joint deformities, effusion, or inflammation, no edema, no skin discoloration, no clubbing, no cyanosis  NEURO: alert & oriented x 3 with fluent speech, no focal motor/sensory deficits, gait normal   PENDING  LABS: CBC diff, CMET   PATHOLOGY: 1. 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 Anticipatory HTN 3. H/O Stage 1 adenocarcinoma of rectosigmoid junction.  4. Dry skin, improved  5. Grade 1 peripheral neuropathy. Stable   PLAN:  1. Complete cycle 10 of chemotherapy today. 2. Will monitor blood pressure  at home. If elevated at home, will consider Amlodipine if needed.  3. Continue with therapy as scheduled 4. PET scan to be done at completion of chemotherapy. 5. Patient will contact Dr. Miles Costain near the completion of chemotherapy for PET scan. 6. Return in 1 month for follow-up as scheduled.   All questions were answered. The patient knows to call the clinic with any problems, questions or concerns. We can certainly see the patient much sooner if necessary.  The patient and plan discussed with Glenford Peers, MD and he is in agreement with the aforementioned.   Duc Crocket

## 2011-03-08 ENCOUNTER — Encounter (HOSPITAL_BASED_OUTPATIENT_CLINIC_OR_DEPARTMENT_OTHER): Payer: Medicare Other

## 2011-03-08 DIAGNOSIS — C787 Secondary malignant neoplasm of liver and intrahepatic bile duct: Secondary | ICD-10-CM

## 2011-03-08 DIAGNOSIS — Z452 Encounter for adjustment and management of vascular access device: Secondary | ICD-10-CM

## 2011-03-08 DIAGNOSIS — C19 Malignant neoplasm of rectosigmoid junction: Secondary | ICD-10-CM

## 2011-03-08 DIAGNOSIS — C189 Malignant neoplasm of colon, unspecified: Secondary | ICD-10-CM

## 2011-03-08 MED ORDER — SODIUM CHLORIDE 0.9 % IJ SOLN
10.0000 mL | INTRAMUSCULAR | Status: DC | PRN
  Administered 2011-03-08: 10 mL
  Filled 2011-03-08: qty 10

## 2011-03-08 MED ORDER — SODIUM CHLORIDE 0.9 % IJ SOLN
INTRAMUSCULAR | Status: AC
  Filled 2011-03-08: qty 10

## 2011-03-08 MED ORDER — HEPARIN SOD (PORK) LOCK FLUSH 100 UNIT/ML IV SOLN
INTRAVENOUS | Status: AC
  Filled 2011-03-08: qty 5

## 2011-03-08 MED ORDER — HEPARIN SOD (PORK) LOCK FLUSH 100 UNIT/ML IV SOLN
500.0000 [IU] | Freq: Once | INTRAVENOUS | Status: AC | PRN
  Administered 2011-03-08: 500 [IU]
  Filled 2011-03-08: qty 5

## 2011-03-10 ENCOUNTER — Encounter (HOSPITAL_COMMUNITY): Payer: Self-pay

## 2011-03-16 ENCOUNTER — Telehealth (HOSPITAL_COMMUNITY): Payer: Self-pay | Admitting: Oncology

## 2011-03-17 ENCOUNTER — Other Ambulatory Visit (HOSPITAL_COMMUNITY): Payer: Medicare Other

## 2011-03-20 ENCOUNTER — Encounter (HOSPITAL_COMMUNITY): Payer: Medicare Other

## 2011-03-20 ENCOUNTER — Encounter (HOSPITAL_BASED_OUTPATIENT_CLINIC_OR_DEPARTMENT_OTHER): Payer: Medicare Other

## 2011-03-20 VITALS — BP 152/98 | HR 73 | Temp 97.1°F | Wt 167.0 lb

## 2011-03-20 DIAGNOSIS — C19 Malignant neoplasm of rectosigmoid junction: Secondary | ICD-10-CM

## 2011-03-20 DIAGNOSIS — C787 Secondary malignant neoplasm of liver and intrahepatic bile duct: Secondary | ICD-10-CM

## 2011-03-20 DIAGNOSIS — C189 Malignant neoplasm of colon, unspecified: Secondary | ICD-10-CM

## 2011-03-20 DIAGNOSIS — Z5111 Encounter for antineoplastic chemotherapy: Secondary | ICD-10-CM

## 2011-03-20 LAB — CBC
MCHC: 33.4 g/dL (ref 30.0–36.0)
Platelets: 164 10*3/uL (ref 150–400)
RDW: 14.4 % (ref 11.5–15.5)
WBC: 3.6 10*3/uL — ABNORMAL LOW (ref 4.0–10.5)

## 2011-03-20 LAB — COMPREHENSIVE METABOLIC PANEL
Chloride: 103 mEq/L (ref 96–112)
Creatinine, Ser: 0.64 mg/dL (ref 0.50–1.10)
GFR calc Af Amer: >90 mL/min (ref >90)
GFR calc non Af Amer: 89 mL/min — ABNORMAL LOW (ref >90)
Potassium: 3.5 mEq/L (ref 3.5–5.1)
Sodium: 140 mEq/L (ref 135–145)

## 2011-03-20 LAB — HEMOGLOBIN A1C
Hgb A1c MFr Bld: 7.9 % — ABNORMAL HIGH (ref <5.7)
Mean Plasma Glucose: 180 mg/dL — ABNORMAL HIGH (ref <117)

## 2011-03-20 LAB — CEA: CEA: 3 ng/mL (ref 0.0–5.0)

## 2011-03-20 LAB — DIFFERENTIAL
Basophils Absolute: 0 10*3/uL (ref 0.0–0.1)
Basophils Relative: 1 % (ref 0–1)
Neutro Abs: 1.6 10*3/uL — ABNORMAL LOW (ref 1.7–7.7)
Neutrophils Relative %: 44 % (ref 43–77)

## 2011-03-20 MED ORDER — OXALIPLATIN CHEMO INJECTION 100 MG/20ML
72.2500 mg/m2 | Freq: Once | INTRAVENOUS | Status: AC
  Administered 2011-03-20: 140 mg via INTRAVENOUS
  Filled 2011-03-20: qty 28

## 2011-03-20 MED ORDER — SODIUM CHLORIDE 0.9 % IV SOLN
Freq: Once | INTRAVENOUS | Status: AC
  Administered 2011-03-20: 8 mg via INTRAVENOUS
  Filled 2011-03-20: qty 4

## 2011-03-20 MED ORDER — LEUCOVORIN CALCIUM INJECTION 350 MG: 400.0000 mg/m2 | Freq: Once | INTRAVENOUS | Status: DC

## 2011-03-20 MED ORDER — DEXAMETHASONE SODIUM PHOSPHATE 10 MG/ML IJ SOLN: 10.0000 mg | Freq: Once | INTRAMUSCULAR | Status: DC

## 2011-03-20 MED ORDER — SODIUM CHLORIDE 0.9 % IV SOLN: 8.0000 mg | Freq: Once | INTRAVENOUS | Status: DC

## 2011-03-20 MED ORDER — DEXTROSE 5 % IV SOLN
50.0000 mL | INTRAVENOUS | Status: AC
  Filled 2011-03-20 (×2): qty 50

## 2011-03-20 MED ORDER — SODIUM CHLORIDE 0.9 % IV SOLN
2040.0000 mg/m2 | INTRAVENOUS | Status: DC
  Administered 2011-03-20: 3950 mg via INTRAVENOUS
  Filled 2011-03-20 (×2): qty 79

## 2011-03-20 MED ORDER — LEUCOVORIN CALCIUM INJECTION 100 MG
38.0000 mg | Freq: Once | INTRAMUSCULAR | Status: AC
  Administered 2011-03-20: 38 mg via INTRAVENOUS
  Filled 2011-03-20: qty 1.9

## 2011-03-20 MED ORDER — SODIUM CHLORIDE 0.9 % IJ SOLN
INTRAMUSCULAR | Status: AC
  Filled 2011-03-20: qty 10

## 2011-03-20 MED ORDER — SODIUM CHLORIDE 0.9 % IJ SOLN
10.0000 mL | INTRAMUSCULAR | Status: DC | PRN
  Administered 2011-03-20: 10 mL
  Filled 2011-03-20: qty 10

## 2011-03-20 MED ORDER — HEPARIN SOD (PORK) LOCK FLUSH 100 UNIT/ML IV SOLN
500.0000 [IU] | Freq: Once | INTRAVENOUS | Status: DC | PRN
  Filled 2011-03-20: qty 5

## 2011-03-20 MED ORDER — SODIUM CHLORIDE 0.9 % IV SOLN
5.0000 mg/kg | Freq: Once | INTRAVENOUS | Status: AC
  Administered 2011-03-20: 425 mg via INTRAVENOUS
  Filled 2011-03-20: qty 17

## 2011-03-20 MED ORDER — FLUOROURACIL CHEMO INJECTION 2.5 GM/50ML
340.0000 mg/m2 | Freq: Once | INTRAVENOUS | Status: AC
  Administered 2011-03-20: 650 mg via INTRAVENOUS
  Filled 2011-03-20: qty 13

## 2011-03-20 NOTE — Progress Notes (Signed)
Tolerated chemo well.  Home with continuous infusion pump. 

## 2011-03-22 ENCOUNTER — Encounter (HOSPITAL_BASED_OUTPATIENT_CLINIC_OR_DEPARTMENT_OTHER): Payer: Medicare Other

## 2011-03-22 DIAGNOSIS — C189 Malignant neoplasm of colon, unspecified: Secondary | ICD-10-CM

## 2011-03-22 DIAGNOSIS — C787 Secondary malignant neoplasm of liver and intrahepatic bile duct: Secondary | ICD-10-CM

## 2011-03-22 DIAGNOSIS — C19 Malignant neoplasm of rectosigmoid junction: Secondary | ICD-10-CM

## 2011-03-22 DIAGNOSIS — Z452 Encounter for adjustment and management of vascular access device: Secondary | ICD-10-CM

## 2011-03-22 MED ORDER — HEPARIN SOD (PORK) LOCK FLUSH 100 UNIT/ML IV SOLN
500.0000 [IU] | Freq: Once | INTRAVENOUS | Status: AC
  Administered 2011-03-22: 500 [IU] via INTRAVENOUS
  Filled 2011-03-22: qty 5

## 2011-03-22 MED ORDER — HEPARIN SOD (PORK) LOCK FLUSH 100 UNIT/ML IV SOLN
INTRAVENOUS | Status: AC
  Filled 2011-03-22: qty 5

## 2011-03-22 MED ORDER — SODIUM CHLORIDE 0.9 % IJ SOLN
INTRAMUSCULAR | Status: AC
  Filled 2011-03-22: qty 10

## 2011-03-22 MED ORDER — SODIUM CHLORIDE 0.9 % IJ SOLN
10.0000 mL | INTRAMUSCULAR | Status: DC | PRN
  Administered 2011-03-22: 10 mL via INTRAVENOUS
  Filled 2011-03-22: qty 10

## 2011-03-31 ENCOUNTER — Ambulatory Visit (HOSPITAL_COMMUNITY): Payer: Medicare Other | Admitting: Oncology

## 2011-04-03 ENCOUNTER — Encounter (HOSPITAL_COMMUNITY): Payer: Medicare Other | Attending: Oncology

## 2011-04-03 ENCOUNTER — Other Ambulatory Visit (HOSPITAL_COMMUNITY): Payer: Medicare Other

## 2011-04-03 VITALS — BP 150/94 | HR 70 | Temp 97.7°F | Wt 165.2 lb

## 2011-04-03 DIAGNOSIS — C787 Secondary malignant neoplasm of liver and intrahepatic bile duct: Secondary | ICD-10-CM

## 2011-04-03 DIAGNOSIS — C189 Malignant neoplasm of colon, unspecified: Secondary | ICD-10-CM | POA: Insufficient documentation

## 2011-04-03 DIAGNOSIS — C19 Malignant neoplasm of rectosigmoid junction: Secondary | ICD-10-CM

## 2011-04-03 DIAGNOSIS — Z5111 Encounter for antineoplastic chemotherapy: Secondary | ICD-10-CM

## 2011-04-03 LAB — COMPREHENSIVE METABOLIC PANEL
AST: 22 U/L (ref 0–37)
BUN: 11 mg/dL (ref 6–23)
CO2: 24 mEq/L (ref 19–32)
Calcium: 9.6 mg/dL (ref 8.4–10.5)
Creatinine, Ser: 0.74 mg/dL (ref 0.50–1.10)
GFR calc Af Amer: >90 mL/min (ref >90)
GFR calc non Af Amer: 85 mL/min — ABNORMAL LOW (ref >90)
Glucose, Bld: 203 mg/dL — ABNORMAL HIGH (ref 70–99)

## 2011-04-03 LAB — DIFFERENTIAL
Basophils Absolute: 0 10*3/uL (ref 0.0–0.1)
Eosinophils Relative: 3 % (ref 0–5)
Monocytes Absolute: 0.7 10*3/uL (ref 0.1–1.0)
Monocytes Relative: 17 % — ABNORMAL HIGH (ref 3–12)
Neutro Abs: 1.5 10*3/uL — ABNORMAL LOW (ref 1.7–7.7)
Neutrophils Relative %: 40 % — ABNORMAL LOW (ref 43–77)

## 2011-04-03 LAB — CBC
Hemoglobin: 11.4 g/dL — ABNORMAL LOW (ref 12.0–15.0)
MCH: 33 pg (ref 26.0–34.0)
MCHC: 33.2 g/dL (ref 30.0–36.0)
Platelets: 177 10*3/uL (ref 150–400)
RBC: 3.45 MIL/uL — ABNORMAL LOW (ref 3.87–5.11)

## 2011-04-03 MED ORDER — SODIUM CHLORIDE 0.9 % IJ SOLN
10.0000 mL | INTRAMUSCULAR | Status: DC | PRN
  Filled 2011-04-03: qty 10

## 2011-04-03 MED ORDER — SODIUM CHLORIDE 0.9 % IV SOLN
INTRAVENOUS | Status: DC
  Administered 2011-04-03: 09:00:00 via INTRAVENOUS

## 2011-04-03 MED ORDER — FLUOROURACIL CHEMO INJECTION 2.5 GM/50ML
340.0000 mg/m2 | Freq: Once | INTRAVENOUS | Status: AC
  Administered 2011-04-03: 650 mg via INTRAVENOUS
  Filled 2011-04-03: qty 13

## 2011-04-03 MED ORDER — HEPARIN SOD (PORK) LOCK FLUSH 100 UNIT/ML IV SOLN
500.0000 [IU] | Freq: Once | INTRAVENOUS | Status: DC | PRN
  Filled 2011-04-03: qty 5

## 2011-04-03 MED ORDER — DEXAMETHASONE SODIUM PHOSPHATE 10 MG/ML IJ SOLN: 10.0000 mg | Freq: Once | INTRAMUSCULAR | Status: DC

## 2011-04-03 MED ORDER — DEXTROSE 5 % IV SOLN
Freq: Once | INTRAVENOUS | Status: DC
  Filled 2011-04-03: qty 1000

## 2011-04-03 MED ORDER — SODIUM CHLORIDE 0.9 % IV SOLN
2040.0000 mg/m2 | INTRAVENOUS | Status: DC
  Administered 2011-04-03: 3950 mg via INTRAVENOUS
  Filled 2011-04-03 (×2): qty 79

## 2011-04-03 MED ORDER — SODIUM CHLORIDE 0.9 % IV SOLN
Freq: Once | INTRAVENOUS | Status: AC
  Administered 2011-04-03: 8 mg via INTRAVENOUS
  Filled 2011-04-03: qty 4

## 2011-04-03 MED ORDER — LEUCOVORIN CALCIUM INJECTION 350 MG
20.0000 mg/m2 | Freq: Once | INTRAMUSCULAR | Status: AC
  Administered 2011-04-03: 38 mg via INTRAVENOUS
  Filled 2011-04-03: qty 1.9

## 2011-04-03 MED ORDER — SODIUM CHLORIDE 0.9 % IV SOLN
5.0000 mg/kg | Freq: Once | INTRAVENOUS | Status: AC
  Administered 2011-04-03: 425 mg via INTRAVENOUS
  Filled 2011-04-03: qty 17

## 2011-04-03 MED ORDER — OXALIPLATIN CHEMO INJECTION 100 MG/20ML
72.2500 mg/m2 | Freq: Once | INTRAVENOUS | Status: AC
  Administered 2011-04-03: 140 mg via INTRAVENOUS
  Filled 2011-04-03: qty 28

## 2011-04-03 MED ORDER — SODIUM CHLORIDE 0.9 % IV SOLN: 8.0000 mg | Freq: Once | INTRAVENOUS | Status: DC

## 2011-04-04 ENCOUNTER — Ambulatory Visit (HOSPITAL_COMMUNITY): Payer: Medicare Other | Admitting: Oncology

## 2011-04-05 ENCOUNTER — Encounter (HOSPITAL_BASED_OUTPATIENT_CLINIC_OR_DEPARTMENT_OTHER): Payer: Medicare Other | Admitting: Oncology

## 2011-04-05 ENCOUNTER — Encounter (HOSPITAL_COMMUNITY): Payer: Medicare Other

## 2011-04-05 VITALS — BP 182/99 | HR 68 | Temp 98.2°F | Wt 165.8 lb

## 2011-04-05 DIAGNOSIS — C189 Malignant neoplasm of colon, unspecified: Secondary | ICD-10-CM

## 2011-04-05 DIAGNOSIS — C787 Secondary malignant neoplasm of liver and intrahepatic bile duct: Secondary | ICD-10-CM

## 2011-04-05 DIAGNOSIS — C19 Malignant neoplasm of rectosigmoid junction: Secondary | ICD-10-CM

## 2011-04-05 MED ORDER — SODIUM CHLORIDE 0.9 % IJ SOLN
10.0000 mL | INTRAMUSCULAR | Status: DC | PRN
  Administered 2011-04-05: 10 mL
  Filled 2011-04-05: qty 10

## 2011-04-05 MED ORDER — HEPARIN SOD (PORK) LOCK FLUSH 100 UNIT/ML IV SOLN
INTRAVENOUS | Status: AC
  Filled 2011-04-05: qty 5

## 2011-04-05 MED ORDER — SODIUM CHLORIDE 0.9 % IJ SOLN
INTRAMUSCULAR | Status: AC
  Filled 2011-04-05: qty 10

## 2011-04-05 MED ORDER — HEPARIN SOD (PORK) LOCK FLUSH 100 UNIT/ML IV SOLN
500.0000 [IU] | Freq: Once | INTRAVENOUS | Status: AC | PRN
Start: 2011-04-05 — End: 2011-04-05
  Administered 2011-04-05: 500 [IU]
  Filled 2011-04-05: qty 5

## 2011-04-05 NOTE — Patient Instructions (Signed)
[name]  [id number] 04-Jul-1940   [facility]  Discharge Instructions  RECOMMENDATIONS MADE BY THE CONSULTANT AND ANY TEST RESULTS WILL BE SENT TO YOUR REFERRING DOCTOR.   EXAM FINDINGS BY MD TODAY AND SIGNS AND SYMPTOMS TO REPORT TO CLINIC OR PRIMARY MD: you are doing well.  Will check PET Scan at the end of May.  Your port will need to be flushed every 6 weeks and we will check your CEA every 12 weeks.  MEDICATIONS PRESCRIBED: none   INSTRUCTIONS GIVEN AND DISCUSSED: Other :  Report changes in bowel habits, blood in stool, etc.    SPECIAL INSTRUCTIONS/FOLLOW-UP: Lab work Needed every 3 months, Xray Studies Needed PET scan at the end of May and Return to Clinic in 3 months to see PA.   I acknowledge that I have been informed and understand all the instructions given to me and received a copy. I do not have any more questions at this time, but understand that I may call the Specialty Clinic at [facility] at [phone] during business hours should I have any further questions or need assistance in obtaining follow-up care.    __________________________________________  _____________  __________ Signature of Patient or Authorized Representative            Date                   Time    __________________________________________ Nurse's Signature

## 2011-04-05 NOTE — Progress Notes (Signed)
CC:   [name], AnitaD. [name], AnitaD. [name]. Anita Motto, MD, [name], FACP, FAGA M. Ruel Favors, MD  DIAGNOSIS: 1. Metastatic recurrent adenocarcinoma of the rectosigmoid junction to     liver once again.  She is status post RFA ablation to the periphery     of this 1st recurrence and that took place, I believe, in     August/September 2012.  She has just finished 6 full cycles of     chemotherapy consisting of FOLFOX/bevacizumab. 2. History of recurrent cancer to liver status post 2 RFA ablations by     Dr. Miles Costain followed by 6 full cycles of oxaliplatin, capecitabine     and Avastin finishing as of 03/12/2008. 3. History of stage II (T3 N0) grade 1 adenocarcinoma of the     rectosigmoid junction status post resection in December 2007 with     11 negative lymph nodes.  She was, however, treated with radiation     therapy to the pelvis with radiosensitizing capecitabine but no     further chemotherapy at that time. [name] is here today with her husband.  She just finished her therapy on Monday, the 1st of April.  She is still tired and weak.  Her sugars have been up because of the premedications but the vast majority of the time they are superbly well controlled.  She is going to see Dr. Lysbeth Galas for a followup next Wednesday.  Hopefully she will be able to come down on her dose of her combination.  Glyburide/metformin drug.  PHYSICAL EXAMINATION:  She looks very good today.  Her vital signs show a weight of 165 pounds which is stable but definitely better than she was in 2011 when she weighed about 184 pounds.  Her blood pressure is little high today 182/99 but that is probably because she is anxious. She is in no pain.  Pulse 68 to 72 and regular, respirations 18 and unlabored.  She is afebrile.  She has no lymphadenopathy.  Lungs: Clear.  Port-A-Cath is intact.  Heart:  Shows a regular rhythm and rate without murmur, rub or gallop.  Abdomen:  Soft, nontender,  without hepatosplenomegaly or masses.  Bowel sounds are normal.  She has no peripheral edema.  So she looks great.  We will do a PET scan at the end of May for followup.  I have talked to Dr. Miles Costain who is in agreement with this plan.  We will see her right after that.  We will do CEAs every 3 months going forward.  Her CEAs have been very, very reliable.    ______________________________ Ladona Horns. Mariel Sleet, MD ESN/MEDQ  D:  04/05/2011  T:  04/05/2011  Job:  [id number]

## 2011-04-05 NOTE — Progress Notes (Signed)
This office note has been dictated.

## 2011-04-21 NOTE — Progress Notes (Signed)
Labs drawn

## 2011-05-17 ENCOUNTER — Encounter (HOSPITAL_COMMUNITY): Payer: Medicare Other | Attending: Oncology

## 2011-05-17 DIAGNOSIS — Z452 Encounter for adjustment and management of vascular access device: Secondary | ICD-10-CM

## 2011-05-17 DIAGNOSIS — C787 Secondary malignant neoplasm of liver and intrahepatic bile duct: Secondary | ICD-10-CM

## 2011-05-17 DIAGNOSIS — C19 Malignant neoplasm of rectosigmoid junction: Secondary | ICD-10-CM

## 2011-05-17 DIAGNOSIS — C189 Malignant neoplasm of colon, unspecified: Secondary | ICD-10-CM | POA: Insufficient documentation

## 2011-05-17 MED ORDER — HEPARIN SOD (PORK) LOCK FLUSH 100 UNIT/ML IV SOLN
INTRAVENOUS | Status: AC
  Filled 2011-05-17: qty 5

## 2011-05-17 MED ORDER — HEPARIN SOD (PORK) LOCK FLUSH 100 UNIT/ML IV SOLN
500.0000 [IU] | Freq: Once | INTRAVENOUS | Status: AC
Start: 2011-05-17 — End: 2011-05-17
  Administered 2011-05-17: 500 [IU] via INTRAVENOUS
  Filled 2011-05-17: qty 5

## 2011-05-17 MED ORDER — SODIUM CHLORIDE 0.9 % IJ SOLN
10.0000 mL | INTRAMUSCULAR | Status: DC | PRN
  Administered 2011-05-17: 10 mL via INTRAVENOUS
  Filled 2011-05-17: qty 10

## 2011-05-17 NOTE — Progress Notes (Signed)
[name] presented for Portacath access and flush. Proper placement of portacath confirmed by CXR. Portacath located lt chest wall accessed with  H 20 needle. Good blood return present. Portacath flushed with 20ml NS and 500U/5ml Heparin and needle removed intact. Procedure without incident. Patient tolerated procedure well.   

## 2011-05-24 ENCOUNTER — Telehealth (HOSPITAL_COMMUNITY): Payer: Self-pay | Admitting: Oncology

## 2011-05-24 NOTE — Telephone Encounter (Signed)
COVENTRY-[phone] FAXED OVER CLINICALS TO RAD DEPT FOR PET [id number]. FAX #[phone]

## 2011-05-30 ENCOUNTER — Encounter (HOSPITAL_COMMUNITY)
Admission: RE | Admit: 2011-05-30 | Discharge: 2011-05-30 | Disposition: A | Discharge status: Home / Self Care | Payer: Medicare Other | Source: Ambulatory Visit | Attending: Oncology | Admitting: Oncology

## 2011-05-30 DIAGNOSIS — I2510 Atherosclerotic heart disease of native coronary artery without angina pectoris: Secondary | ICD-10-CM | POA: Insufficient documentation

## 2011-05-30 DIAGNOSIS — Z9049 Acquired absence of other specified parts of digestive tract: Secondary | ICD-10-CM | POA: Insufficient documentation

## 2011-05-30 DIAGNOSIS — I517 Cardiomegaly: Secondary | ICD-10-CM | POA: Insufficient documentation

## 2011-05-30 DIAGNOSIS — Z9221 Personal history of antineoplastic chemotherapy: Secondary | ICD-10-CM | POA: Insufficient documentation

## 2011-05-30 DIAGNOSIS — R911 Solitary pulmonary nodule: Secondary | ICD-10-CM | POA: Insufficient documentation

## 2011-05-30 DIAGNOSIS — C189 Malignant neoplasm of colon, unspecified: Secondary | ICD-10-CM | POA: Insufficient documentation

## 2011-05-30 MED ORDER — FLUDEOXYGLUCOSE F - 18 (FDG) INJECTION
16.1000 | Freq: Once | INTRAVENOUS | Status: AC | PRN
  Administered 2011-05-30: 16.1 via INTRAVENOUS

## 2011-06-06 ENCOUNTER — Other Ambulatory Visit (HOSPITAL_COMMUNITY): Payer: Self-pay

## 2011-06-07 ENCOUNTER — Telehealth: Payer: Self-pay | Admitting: Interventional Radiology

## 2011-06-07 ENCOUNTER — Encounter: Payer: Self-pay | Admitting: Interventional Radiology

## 2011-06-07 NOTE — Progress Notes (Signed)
Discussed results of PET/CT from 5/28 today by telephone.  PT and husband aware that the PET is negative for any reisdual or new metastatic disease.  Ablated RT hepatic lesion shows no activity.  Plan for outpatient f/u at 1 year post treatment

## 2011-06-07 NOTE — Telephone Encounter (Signed)
See progress note from today

## 2011-06-12 ENCOUNTER — Encounter (HOSPITAL_COMMUNITY): Payer: Medicare Other | Attending: Oncology

## 2011-06-12 DIAGNOSIS — C19 Malignant neoplasm of rectosigmoid junction: Secondary | ICD-10-CM

## 2011-06-12 DIAGNOSIS — C189 Malignant neoplasm of colon, unspecified: Secondary | ICD-10-CM | POA: Insufficient documentation

## 2011-06-12 NOTE — Progress Notes (Signed)
Lab draw

## 2011-06-13 ENCOUNTER — Telehealth (HOSPITAL_COMMUNITY): Payer: Self-pay | Admitting: Oncology

## 2011-06-28 ENCOUNTER — Encounter (HOSPITAL_BASED_OUTPATIENT_CLINIC_OR_DEPARTMENT_OTHER): Payer: Medicare Other | Admitting: Oncology

## 2011-06-28 ENCOUNTER — Encounter (HOSPITAL_COMMUNITY): Payer: Self-pay | Admitting: Oncology

## 2011-06-28 VITALS — BP 144/85 | HR 59 | Temp 97.9°F | Wt 168.8 lb

## 2011-06-28 DIAGNOSIS — C189 Malignant neoplasm of colon, unspecified: Secondary | ICD-10-CM

## 2011-06-28 DIAGNOSIS — C19 Malignant neoplasm of rectosigmoid junction: Secondary | ICD-10-CM

## 2011-06-28 DIAGNOSIS — C787 Secondary malignant neoplasm of liver and intrahepatic bile duct: Secondary | ICD-10-CM

## 2011-06-28 DIAGNOSIS — T451X5A Adverse effect of antineoplastic and immunosuppressive drugs, initial encounter: Secondary | ICD-10-CM

## 2011-06-28 DIAGNOSIS — G620 Drug-induced polyneuropathy: Secondary | ICD-10-CM

## 2011-06-28 NOTE — Progress Notes (Signed)
Anita Hector, MD [address] [place] [address]  1. Colon adenocarcinoma  atorvastatin (LIPITOR) 20 MG tablet, losartan (COZAAR) 50 MG tablet, Diphenhydramine-APAP, sleep, (GOODYS PM PO), CEA, CEA, CBC, Differential, Comprehensive metabolic panel, CEA    CURRENT THERAPY: CEAs monthly.  Radiographic surveillance every 6 months.   INTERVAL HISTORY: [name] 71 y.o. 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 She is status post RFA ablation to the periphery of this 1st recurrence in August/September 2012. She is S/P 6 full cycles of chemotherapy consisting of FOLFOX/bevacizumab with her last cycle being on 04/03/2011.   [name] is doing very well following chemotherapy administration. She completed all therapy as of 04/03/2011.  She does report some peripheral neuropathy which has improved over the past few months. She explains that in her lower extremities it begins at the knees and is noted inferior to her knees. She explains it is much improved and she does not have any problems with ambulation. She does report that occasionally she is fearful when she arises from a sitting or lying position that her legs will "hold her weight.". She says along she doesn't slowly, she is fine on her feet. When she gets calling, she has no lower extremity the issues secondary to the peripheral neuropathy.   She also notes some peripheral neuropathy in the fingertips. Again, this does not interfere with her ADLs. She is able to do anything she wants to do.  I provided the patient with education regarding peripheral neuropathy. It seems as though her peripheral neuropathy is improving and I anticipate will continue to improve and hopefully resolve 100%. I did inform the patient that sometimes is never completely resolves and she is to be prepared for that. I suspect however, that she will certainly improve over  the next few months to one year.  [name] asked when she can have her Port-A-Cath removed. I've asked her to keep it until at least after her next radiographic study which is slated to be performed in the beginning of December. I've also encouraged her to keep it at least one year following completion of chemotherapy. That would be April 2014. She is agreeable to this. We discussed the pros and cons of her keeping her Port-A-Cath since we will be doing surveillance radiographic studies I will require contrast. She understands the pros and cons of keeping her Port-A-Cath and she will consider keeping it for a longer period of time. I've asked her to readdress this issue on her next followup visit with Dr. Mariel Sleet.  [name] has been performing gardening lately. She has been doing just fine in that regard. Her stamina is recovered. She does admit that recently deer have consumed her growing cucumbers in the garden and this of course is frustrating for her. She has lost all of her crop due to this.    [name] asks if she needs to continue having mammograms. She reports her last mammogram was 2 years ago. Of course I've encouraged her to have this done yearly. She asked about her when she needs to have Pap smears performed, and I have deferred this to her primary care physician. She tells me that she will have a mammogram done. In light of her not having a mammogram in 2 years, we'll perform breast exam today. She denies any breast changes. She does perform self breast exams at home. She denies any nipple discharge, breast distortion,  breast masses, and any nipple discharge.  Oncologically, the patient denies any complaints. She is doing very well following chemotherapy administration. I've spent some time discussing her future plan which will consist of CEAs every 3 months. Her cancer marker have been very reliable in the past.  We will also perform radiographic studies every 6 months for 2-3 years for  surveillance. We'll then begin spacing out her radiographic studies to yearly as long as everything is well. We would like to perform PET scans every 6 months for 2-3 years for surveillance. We can then begin entertaining CT scans for surveillance purposes.  Past Medical History  Diagnosis Date  . Colon cancer     liver ca  . HTN (hypertension)   . Thyroid condition   . Diabetes mellitus   . Shingles   . Acute UTI     has Colon adenocarcinoma and Colon cancer on her problem list.      has no known allergies.  [name] had no medications administered during this visit.  Past Surgical History  Procedure Date  . Colectomy     and bil. oopherectomy  . Appendectomy   . Blt   . Resection liver partial / total w/ radiofrequency ablation   . Port-a-cath removal   . Portacath placement 09/2010    Denies any headaches, dizziness, double vision, fevers, chills, night sweats, nausea, vomiting, diarrhea, constipation, chest pain, heart palpitations, shortness of breath, blood in stool, black tarry stool, urinary pain, urinary burning, urinary frequency, hematuria.   PHYSICAL EXAMINATION  ECOG PERFORMANCE STATUS: 1 - Symptomatic but completely ambulatory  Filed Vitals:   06/28/11 0932  BP: 144/85  Pulse: 59  Temp: 97.9 F (36.6 C)    GENERAL:alert, healthy, no distress, well nourished, well developed, comfortable, cooperative and smiling SKIN: skin color, texture, turgor are normal, no rashes or significant lesions HEAD: Normocephalic, No masses, lesions, tenderness or abnormalities EYES: normal, Conjunctiva are pink and non-injected EARS: External ears normal OROPHARYNX:lips, buccal mucosa, and tongue normal and mucous membranes are moist  NECK: supple, no adenopathy, thyroid normal size, non-tender, without nodularity, no stridor, non-tender, trachea midline LYMPH:  no palpable lymphadenopathy, no hepatosplenomegaly BREAST:breasts appear normal, no suspicious masses, no skin or  nipple changes or axillary nodes LUNGS: clear to auscultation and percussion HEART: regular rate & rhythm, no murmurs, no gallops, S1 normal and S2 normal ABDOMEN:abdomen soft, non-tender, normal bowel sounds, no masses or organomegaly and no hepatosplenomegaly BACK: Back symmetric, no curvature., No CVA tenderness EXTREMITIES:less then 2 second capillary refill, no joint deformities, effusion, or inflammation, no edema, no skin discoloration, no clubbing, no cyanosis  NEURO: alert & oriented x 3 with fluent speech, no focal motor/sensory deficits, gait normal    LABORATORY DATA: Lab Results  Component Value Date   CEA 2.1 06/12/2011   CBC    Component Value Date/Time   WBC 3.9* 04/03/2011 0842   RBC 3.45* 04/03/2011 0842   HGB 11.4* 04/03/2011 0842   HCT 34.3* 04/03/2011 0842   PLT 177 04/03/2011 0842   MCV 99.4 04/03/2011 0842   MCH 33.0 04/03/2011 0842   MCHC 33.2 04/03/2011 0842   RDW 15.0 04/03/2011 0842   LYMPHSABS 1.6 04/03/2011 0842   MONOABS 0.7 04/03/2011 0842   EOSABS 0.1 04/03/2011 0842   BASOSABS 0.0 04/03/2011 0842      Chemistry      Component Value Date/Time   NA 141 04/03/2011 0842   K 4.0 04/03/2011 0842   CL 105 04/03/2011  0842   CO2 24 04/03/2011 0842   BUN 11 04/03/2011 0842   CREATININE 0.74 04/03/2011 0842      Component Value Date/Time   CALCIUM 9.6 04/03/2011 0842   ALKPHOS 52 04/03/2011 0842   AST 22 04/03/2011 0842   ALT 18 04/03/2011 0842   BILITOT 0.6 04/03/2011 0842        RADIOGRAPHIC STUDIES:  05/30/2011  *RADIOLOGY REPORT*  Clinical Data: Subsequent treatment strategy for metastatic colon  cancer. Status post chemotherapy and hepatic RFA.  NUCLEAR MEDICINE PET SKULL BASE TO THIGH  Fasting Blood Glucose: 124  Technique: 16.1 mCi F-18 FDG was injected intravenously. CT data  was obtained and used for attenuation correction and anatomic  localization only. (This was not acquired as a diagnostic CT  examination.) Additional exam technical data entered on  technologist  worksheet.  Comparison: [facility] CT abdomen pelvis dated 10/12/2010.  [facility] PET CT dated 06/03/2010.  Findings:  Neck: No hypermetabolic lymph nodes in the neck.  Chest: No hypermetabolic mediastinal or hilar nodes.  Stable 3 mm left upper lobe nodule (series 2/imge 77). Mild patchy  opacity / atelectasis in the bilateral lower lobes.  Left chest port. Cardiomegaly. Coronary atherosclerosis.  Abdomen/Pelvis: Prior radiofrequency ablation in the lateral  segment right hepatic lobe, measuring approximately 2.2 x 2.1 cm  (series 2/image 95), without associated hypermetabolism.  Additional scattered hepatic parenchymal calcifications.  No abnormal hypermetabolic activity within the liver, pancreas,  adrenal glands, or spleen.  Prior distal colonic resection (series 2/image 186). No abnormal  soft tissue in the surgical bed.  No hypermetabolic lymph nodes in the abdomen or pelvis.  [place]: No focal hypermetabolic activity to suggest skeletal  metastasis.  IMPRESSION:  Prior distal colonic resection. Status post hepatic RFA.  No evidence of recurrent or metastatic disease.  Original Report Authenticated By: Charline Bills, M.D.    PATHOLOGY: 1. Liver FNA- metastatic adenocarcinoma    ASSESSMENT:  1. 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 She is status post RFA ablation to the periphery of this 1st recurrence in August/September 2012. She is S/P 6 full cycles of chemotherapy consisting of FOLFOX/bevacizumab.  2. History of recurrent cancer to liver status post 2 RFA ablations by Dr. Miles Costain followed by 6 full cycles of oxaliplatin, capecitabine and Avastin finishing as of 03/12/2008.  3. History of stage II (T3 N0) grade 1 adenocarcinoma of the rectosigmoid junction status post resection in December 2007 with 11 negative lymph nodes. She was, however, treated with radiation therapy to the pelvis with radiosensitizing  capecitabine but no further chemotherapy at that time. 4. Grade 1 peripheral neuropathy, secondary to chemotherapy, improving.   PLAN:  1. I personally reviewed and went over laboratory results with the patient. 2. I personally reviewed and went over radiographic studies with the patient. 3. Lab work in 3 months: CBC diff, CMET, CEA 4. PET scan at beginning of December 5. Surveillance radiographic study every 6 months.  Last PET scan was at the end of May 2013.  She will need another study in the November/December 2013 timeframe.  6. Lab work the day of her December follow-up appointment: CEA 7. I have asked the patient to keep her port-a-cath for a while.  Specifically, I have asked her to keep her port definitely until after the next scan, but I would like for her to keep it for at least 1 year total.  She is agreeable to this.  8. Return in 3  months for follow-up and following PET scan.   All questions were answered. The patient knows to call the clinic with any problems, questions or concerns. We can certainly see the patient much sooner if necessary.  The patient and plan discussed with Glenford Peers, MD and he is in agreement with the aforementioned.  Antonela [name]

## 2011-06-28 NOTE — Patient Instructions (Addendum)
[name]  [id number] Sep 30, 1940 Dr. Glenford Peers   [facility]  Discharge Instructions  RECOMMENDATIONS MADE BY THE CONSULTANT AND ANY TEST RESULTS WILL BE SENT TO YOUR REFERRING DOCTOR.   EXAM FINDINGS BY MD TODAY AND SIGNS AND SYMPTOMS TO REPORT TO CLINIC OR PRIMARY MD: you are doing well.  Will continue checking your CEA every 3 months and will need to check a PET scan in 6 months and have Dr. Mariel Sleet to see you after the PET scan.  MEDICATIONS PRESCRIBED: none   INSTRUCTIONS GIVEN AND DISCUSSED: Other :  Report changes in bowel habits, blood in stool, shortness of breath, etc.  SPECIAL INSTRUCTIONS/FOLLOW-UP: Lab work Needed every 3 months, Xray Studies Needed:  PET scan in 6 months  and Return to Clinic to see Dr. Mariel Sleet in 3 months.  I acknowledge that I have been informed and understand all the instructions given to me and received a copy. I do not have any more questions at this time, but understand that I may call the Specialty Clinic at [facility] at [phone] during business hours should I have any further questions or need assistance in obtaining follow-up care.    __________________________________________  _____________  __________ Signature of Patient or Authorized Representative            Date                   Time    __________________________________________ Nurse's Signature

## 2011-07-11 ENCOUNTER — Encounter (HOSPITAL_COMMUNITY): Payer: Medicare Other | Attending: Oncology

## 2011-07-11 DIAGNOSIS — Z95828 Presence of other vascular implants and grafts: Secondary | ICD-10-CM

## 2011-07-11 DIAGNOSIS — Z452 Encounter for adjustment and management of vascular access device: Secondary | ICD-10-CM

## 2011-07-11 DIAGNOSIS — C7800 Secondary malignant neoplasm of unspecified lung: Secondary | ICD-10-CM

## 2011-07-11 DIAGNOSIS — C19 Malignant neoplasm of rectosigmoid junction: Secondary | ICD-10-CM

## 2011-07-11 DIAGNOSIS — Z9889 Other specified postprocedural states: Secondary | ICD-10-CM | POA: Insufficient documentation

## 2011-07-11 MED ORDER — HEPARIN SOD (PORK) LOCK FLUSH 100 UNIT/ML IV SOLN
500.0000 [IU] | Freq: Once | INTRAVENOUS | Status: AC
  Administered 2011-07-11: 500 [IU] via INTRAVENOUS
  Filled 2011-07-11: qty 5

## 2011-07-11 MED ORDER — HEPARIN SOD (PORK) LOCK FLUSH 100 UNIT/ML IV SOLN
INTRAVENOUS | Status: AC
  Filled 2011-07-11: qty 5

## 2011-07-11 MED ORDER — SODIUM CHLORIDE 0.9 % IJ SOLN
INTRAMUSCULAR | Status: AC
  Filled 2011-07-11: qty 10

## 2011-07-11 MED ORDER — SODIUM CHLORIDE 0.9 % IJ SOLN
10.0000 mL | INTRAMUSCULAR | Status: DC | PRN
  Administered 2011-07-11: 10 mL via INTRAVENOUS
  Filled 2011-07-11: qty 10

## 2011-07-11 NOTE — Progress Notes (Signed)
[name] presented for Portacath access and flush. Proper placement of portacath confirmed by CXR. Portacath located lt chest wall accessed with  H 20 needle. Good blood return present. Portacath flushed with 20ml NS and 500U/79ml Heparin and needle removed intact. Procedure without incident. Patient tolerated procedure well.

## 2011-07-24 ENCOUNTER — Other Ambulatory Visit (HOSPITAL_COMMUNITY): Payer: Medicare Other

## 2011-08-21 ENCOUNTER — Other Ambulatory Visit (HOSPITAL_COMMUNITY): Payer: Medicare Other

## 2011-08-22 ENCOUNTER — Encounter (HOSPITAL_COMMUNITY): Payer: Medicare Other | Attending: Oncology

## 2011-08-22 DIAGNOSIS — C787 Secondary malignant neoplasm of liver and intrahepatic bile duct: Secondary | ICD-10-CM

## 2011-08-22 DIAGNOSIS — Z452 Encounter for adjustment and management of vascular access device: Secondary | ICD-10-CM

## 2011-08-22 DIAGNOSIS — C19 Malignant neoplasm of rectosigmoid junction: Secondary | ICD-10-CM

## 2011-08-22 DIAGNOSIS — C189 Malignant neoplasm of colon, unspecified: Secondary | ICD-10-CM | POA: Insufficient documentation

## 2011-08-22 LAB — DIFFERENTIAL
Basophils Absolute: 0.1 10*3/uL (ref 0.0–0.1)
Eosinophils Relative: 3 % (ref 0–5)
Lymphocytes Relative: 33 % (ref 12–46)
Monocytes Absolute: 0.5 10*3/uL (ref 0.1–1.0)
Monocytes Relative: 7 % (ref 3–12)

## 2011-08-22 LAB — CBC
HCT: 38.5 % (ref 36.0–46.0)
Hemoglobin: 12.8 g/dL (ref 12.0–15.0)
MCV: 97.5 fL (ref 78.0–100.0)
RDW: 13 % (ref 11.5–15.5)
WBC: 7.5 10*3/uL (ref 4.0–10.5)

## 2011-08-22 LAB — COMPREHENSIVE METABOLIC PANEL
AST: 17 U/L (ref 0–37)
BUN: 16 mg/dL (ref 6–23)
CO2: 27 mEq/L (ref 19–32)
Calcium: 10.2 mg/dL (ref 8.4–10.5)
Chloride: 99 mEq/L (ref 96–112)
Creatinine, Ser: 0.74 mg/dL (ref 0.50–1.10)
GFR calc Af Amer: >90 mL/min (ref >90)
GFR calc non Af Amer: 85 mL/min — ABNORMAL LOW (ref >90)
Glucose, Bld: 192 mg/dL — ABNORMAL HIGH (ref 70–99)
Total Bilirubin: 0.3 mg/dL (ref 0.3–1.2)

## 2011-08-22 MED ORDER — HEPARIN SOD (PORK) LOCK FLUSH 100 UNIT/ML IV SOLN
500.0000 [IU] | Freq: Once | INTRAVENOUS | Status: AC
  Administered 2011-08-22: 500 [IU] via INTRAVENOUS
  Filled 2011-08-22: qty 5

## 2011-08-22 MED ORDER — HEPARIN SOD (PORK) LOCK FLUSH 100 UNIT/ML IV SOLN
INTRAVENOUS | Status: AC
  Filled 2011-08-22: qty 55

## 2011-08-22 MED ORDER — SODIUM CHLORIDE 0.9 % IJ SOLN
INTRAMUSCULAR | Status: AC
  Filled 2011-08-22: qty 10

## 2011-08-22 MED ORDER — SODIUM CHLORIDE 0.9 % IJ SOLN
10.0000 mL | INTRAMUSCULAR | Status: DC | PRN
  Administered 2011-08-22: 10 mL via INTRAVENOUS
  Filled 2011-08-22: qty 10

## 2011-08-22 NOTE — Progress Notes (Signed)
Tolerated port flush with labs well.  Good blood return.

## 2011-08-23 LAB — CEA: CEA: 3.4 ng/mL (ref 0.0–5.0)

## 2011-09-25 ENCOUNTER — Other Ambulatory Visit (HOSPITAL_COMMUNITY): Payer: Medicare Other

## 2011-09-28 ENCOUNTER — Encounter (HOSPITAL_COMMUNITY): Payer: Medicare Other | Attending: Oncology | Admitting: Oncology

## 2011-09-28 VITALS — BP 159/90 | HR 64 | Temp 97.4°F | Resp 16 | Wt 176.9 lb

## 2011-09-28 DIAGNOSIS — E119 Type 2 diabetes mellitus without complications: Secondary | ICD-10-CM

## 2011-09-28 DIAGNOSIS — C787 Secondary malignant neoplasm of liver and intrahepatic bile duct: Secondary | ICD-10-CM

## 2011-09-28 DIAGNOSIS — C19 Malignant neoplasm of rectosigmoid junction: Secondary | ICD-10-CM

## 2011-09-28 DIAGNOSIS — C189 Malignant neoplasm of colon, unspecified: Secondary | ICD-10-CM

## 2011-09-28 MED ORDER — SODIUM CHLORIDE 0.9 % IJ SOLN
10.0000 mL | INTRAMUSCULAR | Status: DC | PRN
  Administered 2011-09-28: 10 mL via INTRAVENOUS
  Filled 2011-09-28: qty 10

## 2011-09-28 MED ORDER — HEPARIN SOD (PORK) LOCK FLUSH 100 UNIT/ML IV SOLN
500.0000 [IU] | Freq: Once | INTRAVENOUS | Status: AC
  Administered 2011-09-28: 500 [IU] via INTRAVENOUS
  Filled 2011-09-28: qty 5

## 2011-09-28 MED ORDER — SODIUM CHLORIDE 0.9 % IJ SOLN
INTRAMUSCULAR | Status: AC
  Filled 2011-09-28: qty 10

## 2011-09-28 MED ORDER — HEPARIN SOD (PORK) LOCK FLUSH 100 UNIT/ML IV SOLN
INTRAVENOUS | Status: AC
  Filled 2011-09-28: qty 5

## 2011-09-28 NOTE — Progress Notes (Signed)
[name] presented for Portacath access and flush. Proper placement of portacath confirmed by CXR. Portacath located left chest wall accessed with  H 20 needle. Good blood return present. Portacath flushed with 20ml NS and 500U/5ml Heparin and needle removed intact. Procedure without incident. Patient tolerated procedure well.   

## 2011-09-28 NOTE — Patient Instructions (Addendum)
[facility]  Discharge Instructions  RECOMMENDATIONS MADE BY THE CONSULTANT AND ANY TEST RESULTS WILL BE SENT TO YOUR REFERRING DOCTOR.   Port flush done today. Keep appointments as already scheduled for lab work, PET scan and Dr.Neijstrom. Report any issues/concerns to clinic as needed.    I acknowledge that I have been informed and understand all the instructions given to me and received a copy. I do not have any more questions at this time, but understand that I may call the Specialty Clinic at [facility] at [phone] during business hours should I have any further questions or need assistance in obtaining follow-up care.    __________________________________________  _____________  __________ Signature of Patient or Authorized Representative            Date                   Time    __________________________________________ Nurse's Signature

## 2011-09-28 NOTE — Progress Notes (Signed)
Anita Hector, MD [address] [place] [address]  1. Colon adenocarcinoma     CURRENT THERAPY:CEAs monthly. Radiographic surveillance every 6 months.   INTERVAL HISTORY: [name] 71 y.o. 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 [name] is status post RFA ablation to the periphery of this 1st recurrence in August/September 2012. [name] is S/P 6 full cycles of chemotherapy consisting of FOLFOX/bevacizumab beginning on 10/04/2010 with her last cycle being on 04/03/2011.   [name] is doing great.  [name] reports that [name] feels better and better.  I printed her lab work for [place] to review.  I personally reviewed and went over laboratory results with the patient. Her glucose is elevated at 192, but we are not sure if this was fasting.  I will defer to PCP for further work-up if needed.   [name] is scheduled for lab work in December, followed by surveillance PET scan.  [name] will then be seen in follow-up after that.  [name] asks if [name] will need a colonoscopy and [name] will.  I provided her education that radiographic studies only look at the outside of the colonic lumen and a colonoscopy will be required to see the inside of the lumen.  [name] is appreciative of this information.  [name] also asks why we will perform a PET scan if her labs look great.  I provided her education that radiographic studies are required because not always does CEA correlate with cancer burden.    Oncologically, Meghin denies any complaints.  Complete ROS questioning is negative.     Past Medical History  Diagnosis Date  . Colon cancer     liver ca  . HTN (hypertension)   . Thyroid condition   . Diabetes mellitus   . Shingles   . Acute UTI     has Colon adenocarcinoma on her problem list.      has no known allergies.  Ms. Winslett had no medications administered during this visit.  Past Surgical History  Procedure Date  .  Colectomy     and bil. oopherectomy  . Appendectomy   . Blt   . Resection liver partial / total w/ radiofrequency ablation   . Port-a-cath removal   . Portacath placement 09/2010    Denies any headaches, dizziness, double vision, fevers, chills, night sweats, nausea, vomiting, diarrhea, constipation, chest pain, heart palpitations, shortness of breath, blood in stool, black tarry stool, urinary pain, urinary burning, urinary frequency, hematuria.   PHYSICAL EXAMINATION  ECOG PERFORMANCE STATUS: 0 - Asymptomatic  Filed Vitals:   09/28/11 1000  BP: 159/90  Pulse: 64  Temp: 97.4 F (36.3 C)  Resp: 16    GENERAL:alert, no distress, well nourished, well developed, comfortable, cooperative and smiling SKIN: skin color, texture, turgor are normal, no rashes or significant lesions HEAD: Normocephalic, No masses, lesions, tenderness or abnormalities EYES: normal, Conjunctiva are pink and non-injected EARS: External ears normal OROPHARYNX:lips, buccal mucosa, and tongue normal and mucous membranes are moist  NECK: supple, no adenopathy, trachea midline LYMPH:  no palpable lymphadenopathy, no hepatosplenomegaly BREAST:not examined LUNGS: clear to auscultation and percussion HEART: regular rate & rhythm, no murmurs, no gallops, S1 normal and S2 normal ABDOMEN:abdomen soft, non-tender, obese, normal bowel sounds, no masses or organomegaly and no hepatosplenomegaly BACK: Back symmetric, no curvature., No CVA tenderness EXTREMITIES:less then 2 second capillary refill, no joint deformities, effusion, or inflammation, no edema, no skin discoloration, no  clubbing, no cyanosis  NEURO: alert & oriented x 3 with fluent speech, no focal motor/sensory deficits, gait normal   LABORATORY DATA: CBC    Component Value Date/Time   WBC 7.5 08/22/2011 1420   RBC 3.95 08/22/2011 1420   HGB 12.8 08/22/2011 1420   HCT 38.5 08/22/2011 1420   PLT 241 08/22/2011 1420   MCV 97.5 08/22/2011 1420   MCH 32.4  08/22/2011 1420   MCHC 33.2 08/22/2011 1420   RDW 13.0 08/22/2011 1420   LYMPHSABS 2.5 08/22/2011 1420   MONOABS 0.5 08/22/2011 1420   EOSABS 0.2 08/22/2011 1420   BASOSABS 0.1 08/22/2011 1420      Chemistry      Component Value Date/Time   NA 137 08/22/2011 1420   K 3.8 08/22/2011 1420   CL 99 08/22/2011 1420   CO2 27 08/22/2011 1420   BUN 16 08/22/2011 1420   CREATININE 0.74 08/22/2011 1420      Component Value Date/Time   CALCIUM 10.2 08/22/2011 1420   ALKPHOS 52 08/22/2011 1420   AST 17 08/22/2011 1420   ALT 14 08/22/2011 1420   BILITOT 0.3 08/22/2011 1420     Lab Results  Component Value Date   CEA 3.4 08/22/2011    PATHOLOGY: 1. Liver FNA- metastatic adenocarcinoma    ASSESSMENT:  1. 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 [name] is status post RFA ablation to the periphery of this 1st recurrence in August/September 2012. [name] is S/P 6 full cycles of chemotherapy consisting of FOLFOX/bevacizumab (10/04/2011- 04/03/2011).  2. History of recurrent cancer to liver status post 2 RFA ablations by Dr. Miles Costain followed by 6 full cycles of oxaliplatin, capecitabine and Avastin finishing as of 03/12/2008.  3. History of stage II (T3 N0) grade 1 adenocarcinoma of the rectosigmoid junction status post resection in December 2007 with 11 negative lymph nodes. [name] was, however, treated with radiation therapy to the pelvis with radiosensitizing capecitabine but no further chemotherapy at that time.  4. Grade 1 peripheral neuropathy, secondary to chemotherapy, improving.   PLAN:  1. I personally reviewed and went over laboratory results with the patient. 2. Continue with CEA and lab work as scheduled.   3. PET scan scheduled for 12/04/2011 for surveillance.  4.  [name] is scheduled to see Dr. Lysbeth Galas, PCP, in the near future regarding her glucose control. 5. Follow-up as scheduled on 12/06/2011 following the PET scan.  I ask that [name] discuss with Dr. Mariel Sleet the length  of keeping her port-a-cath.   All questions were answered. The patient knows to call the clinic with any problems, questions or concerns. We can certainly see the patient much sooner if necessary.  Anwitha Mapes

## 2011-10-16 ENCOUNTER — Ambulatory Visit (HOSPITAL_COMMUNITY): Payer: Medicare Other | Admitting: Oncology

## 2011-11-09 ENCOUNTER — Encounter (HOSPITAL_COMMUNITY): Payer: Medicare Other | Attending: Oncology

## 2011-11-09 DIAGNOSIS — C19 Malignant neoplasm of rectosigmoid junction: Secondary | ICD-10-CM

## 2011-11-09 DIAGNOSIS — C189 Malignant neoplasm of colon, unspecified: Secondary | ICD-10-CM | POA: Insufficient documentation

## 2011-11-09 DIAGNOSIS — C787 Secondary malignant neoplasm of liver and intrahepatic bile duct: Secondary | ICD-10-CM

## 2011-11-09 LAB — CBC
Platelets: 247 10*3/uL (ref 150–400)
RBC: 3.96 MIL/uL (ref 3.87–5.11)
RDW: 13 % (ref 11.5–15.5)
WBC: 7.7 10*3/uL (ref 4.0–10.5)

## 2011-11-09 LAB — DIFFERENTIAL
Basophils Absolute: 0 10*3/uL (ref 0.0–0.1)
Lymphocytes Relative: 27 % (ref 12–46)
Lymphs Abs: 2.1 10*3/uL (ref 0.7–4.0)
Neutro Abs: 5 10*3/uL (ref 1.7–7.7)
Neutrophils Relative %: 65 % (ref 43–77)

## 2011-11-09 LAB — BASIC METABOLIC PANEL
CO2: 25 mEq/L (ref 19–32)
Calcium: 9.6 mg/dL (ref 8.4–10.5)
Glucose, Bld: 94 mg/dL (ref 70–99)
Potassium: 3.7 mEq/L (ref 3.5–5.1)
Sodium: 137 mEq/L (ref 135–145)

## 2011-11-09 MED ORDER — HEPARIN SOD (PORK) LOCK FLUSH 100 UNIT/ML IV SOLN
500.0000 [IU] | Freq: Once | INTRAVENOUS | Status: AC
  Administered 2011-11-09: 500 [IU] via INTRAVENOUS
  Filled 2011-11-09: qty 5

## 2011-11-09 MED ORDER — SODIUM CHLORIDE 0.9 % IJ SOLN
INTRAMUSCULAR | Status: AC
  Filled 2011-11-09: qty 20

## 2011-11-09 MED ORDER — SODIUM CHLORIDE 0.9 % IJ SOLN
10.0000 mL | INTRAMUSCULAR | Status: DC | PRN
  Administered 2011-11-09: 10 mL via INTRAVENOUS
  Filled 2011-11-09: qty 10

## 2011-11-09 MED ORDER — HEPARIN SOD (PORK) LOCK FLUSH 100 UNIT/ML IV SOLN
INTRAVENOUS | Status: AC
  Filled 2011-11-09: qty 5

## 2011-11-09 NOTE — Progress Notes (Signed)
Vermelle N Louissaint presented for Portacath access and flush. Proper placement of portacath confirmed by CXR. Portacath located lt chest wall accessed with  H 20 needle. Good blood return present. Portacath flushed with 20ml NS and 500U/5ml Heparin and needle removed intact. Procedure without incident. Patient tolerated procedure well.   

## 2011-11-10 LAB — CEA: CEA: 4.3 ng/mL (ref 0.0–5.0)

## 2011-12-04 ENCOUNTER — Encounter (HOSPITAL_COMMUNITY)
Admission: RE | Admit: 2011-12-04 | Discharge: 2011-12-04 | Disposition: A | Discharge status: Home / Self Care | Payer: Medicare Other | Source: Ambulatory Visit | Attending: Oncology | Admitting: Oncology

## 2011-12-04 DIAGNOSIS — C189 Malignant neoplasm of colon, unspecified: Secondary | ICD-10-CM | POA: Insufficient documentation

## 2011-12-04 LAB — GLUCOSE, CAPILLARY: Glucose-Capillary: 171 mg/dL — ABNORMAL HIGH (ref 70–99)

## 2011-12-04 MED ORDER — FLUDEOXYGLUCOSE F - 18 (FDG) INJECTION
18.7000 | Freq: Once | INTRAVENOUS | Status: AC | PRN
  Administered 2011-12-04: 18.7 via INTRAVENOUS

## 2011-12-06 ENCOUNTER — Encounter (HOSPITAL_COMMUNITY): Payer: Self-pay | Admitting: Oncology

## 2011-12-06 ENCOUNTER — Encounter (HOSPITAL_COMMUNITY): Payer: Medicare Other | Attending: Oncology | Admitting: Oncology

## 2011-12-06 VITALS — BP 140/74 | HR 81 | Temp 98.1°F | Resp 18 | Wt 178.5 lb

## 2011-12-06 DIAGNOSIS — C787 Secondary malignant neoplasm of liver and intrahepatic bile duct: Secondary | ICD-10-CM

## 2011-12-06 DIAGNOSIS — C189 Malignant neoplasm of colon, unspecified: Secondary | ICD-10-CM | POA: Insufficient documentation

## 2011-12-06 DIAGNOSIS — C801 Malignant (primary) neoplasm, unspecified: Secondary | ICD-10-CM | POA: Insufficient documentation

## 2011-12-06 DIAGNOSIS — C19 Malignant neoplasm of rectosigmoid junction: Secondary | ICD-10-CM

## 2011-12-06 NOTE — Progress Notes (Signed)
Problem #1 metastatic recurrent adenocarcinoma of the rectosigmoid junction to liver and now to both lungs. She has a single lesion in the liver and multiple lesions in both lungs. She remains asymptomatic. She is here today with her husband ago the results of her PET scan. The lung lesions are small but definitely abnormal in appearance. They do not take up dye on the PET scan.  Oncology review of systems is negative. Vital signs are stable. She has noticed however that in the last 2 months to 3 months her sugars have been more difficult to control with the same medications.  She looks very good today. She has no edema of the arms or legs. Port-A-Cath in the left upper chest walls intact. Her lungs are clear to auscultation and percussion. She has no lymphadenopathy in the cervical, subclavicular, infraclavicular, axillary, or inguinal areas. Her abdomen is soft nontender without organomegaly or masses. Bowel sounds are normal. Heart shows a regular rhythm and rate without murmur rub or gallop.  She would like to wait till after the holidays to reinitiate chemotherapy which we will do with Folfiri Plus Avastin at this juncture. After 3 cycles we will see what her response is.

## 2011-12-06 NOTE — Patient Instructions (Addendum)
[facility]  Discharge Instructions  RECOMMENDATIONS MADE BY THE CONSULTANT AND ANY TEST RESULTS WILL BE SENT TO YOUR REFERRING DOCTOR.   EXAM FINDINGS BY MD TODAY AND SIGNS AND SYMPTOMS TO REPORT TO CLINIC OR PRIMARY MD: exam and discussion by MD.  Your scan shows that you have involvement in your lungs and liver and we will restart your chemotherapy with some different drugs in January.  We will get you scheduled for chemotherapy teaching in early January.  MEDICATIONS PRESCRIBED: none   INSTRUCTIONS GIVEN AND DISCUSSED: Other :  Report uncontrolled pain, shortness of breath,etc.  SPECIAL INSTRUCTIONS/FOLLOW-UP: Return to Clinic on to be arranged for chemo teaching, 1/14 at 8:45 am to restart chemotherapy and to see Dr. Mariel Sleet in February.   I acknowledge that I have been informed and understand all the instructions given to me and received a copy. I do not have any more questions at this time, but understand that I may call the Specialty Clinic at [facility] at [phone] during business hours should I have any further questions or need assistance in obtaining follow-up care.    __________________________________________  _____________  __________ Signature of Patient or Authorized Representative            Date                   Time    __________________________________________ Nurse's Signature

## 2011-12-07 ENCOUNTER — Inpatient Hospital Stay: Admission: RE | Admit: 2011-12-07 | Payer: Medicare Other | Source: Ambulatory Visit

## 2011-12-11 ENCOUNTER — Telehealth (HOSPITAL_COMMUNITY): Payer: Self-pay | Admitting: Oncology

## 2011-12-11 NOTE — Telephone Encounter (Signed)
FAXED CHEMO SCHEDULE TO [facility] FOR AUTH. [phone]

## 2011-12-21 ENCOUNTER — Encounter (HOSPITAL_COMMUNITY): Payer: Medicare Other

## 2012-01-01 MED ORDER — LOPERAMIDE HCL 2 MG PO TABS: ORAL_TABLET | ORAL | Status: DC

## 2012-01-01 NOTE — Patient Instructions (Addendum)
[facility]   CHEMOTHERAPY INSTRUCTIONS  Irinotecan is a chemotherapy drug primarily used to treat colon and rectal cancer. This drug like other chemo can cause lowering of blood counts, increased risk of infection, bleeding or bruising more easily, fatigue, hair loss, mouth sores, stomach cramps, diarrhea, and nausea with vomiting. The main symptom that we look for with this drug is stomach cramps and diarrhea. If you develop diarrhea we want you to take 2 immodium after the first loose stool then take 1 immodium every 2 hrs until you go 12 hrs without diarrhea. It is very important that you drink 6-8 glasses of fluids daily while going thru chemo especially if you are having diarrhea. We do not want you to get dehydrated. If the immodium does not stop diarrhea within 12 hrs please call us at [phone]. If this occurs on the weekend report to the ED.    POTENTIAL SIDE EFFECTS OF TREATMENT: Increased Susceptibility to Infection, Vomiting, Hair Thinning, Bone Marrow Suppression, Abdominal Cramping, Nausea, Diarrhea and Mouth Sores   EDUCATIONAL MATERIALS GIVEN AND REVIEWED: Specific Instructions Sheets on Irinotecan   SELF CARE ACTIVITIES WHILE ON CHEMOTHERAPY: Increase your fluid intake 48 hours prior to treatment and drink at least 2 quarts per day after treatment., No alcohol intake., No aspirin or other medications unless approved by your oncologist., Eat foods that are light and easy to digest., Eat foods at cold or room temperature., No fried, fatty, or spicy foods immediately before or after treatment., Have teeth cleaned professionally before starting treatment. Keep dentures and partial plates clean., Use soft toothbrush and do not use mouthwashes that contain alcohol. Biotene is a good mouthwash that is available at most pharmacies or may be ordered by calling [phone]., Use warm salt water gargles (1 teaspoon salt per 1 quart warm water) before and  after meals and at bedtime. Or you may rinse with 2 tablespoons of three -percent hydrogen peroxide mixed in eight ounces of water., Always use sunscreen with SPF (Sun Protection Factor) of 30 or higher., Use your nausea medication as directed to prevent nausea., Use your stool softener or laxative as directed to prevent constipation. and Use your anti-diarrheal medication as directed to stop diarrhea.  Please wash your hands for at least 30 seconds using warm soapy water. Handwashing is the #1 way to prevent the spread of germs. Stay away from sick people or people who are getting over a cold. If you develop respiratory systems such as green/yellow mucus production or productive cough or persistent cough let us know and we will see if you need an antibiotic. It is a good idea to keep a pair of gloves on when going into grocery stores/[employer] to decrease your risk of coming into contact with germs on the carts, etc. Carry alcohol hand gel with you at all times and use it frequently if out in public. All foods need to be cooked thoroughly. No raw foods. No medium or undercooked meats, eggs. If your food is cooked medium well, it does not need to be hot pink or saturated with bloody liquid at all. Vegetables and fruits need to be washed/rinsed under the faucet with a dish detergent before being consumed. You can eat raw fruits and vegetables unless we tell you otherwise but it would be best if you cooked them or bought frozen. Do not eat off of salad bars or hot bars unless you really trust the cleanliness of the restaurant. If you need  dental work, please let Dr. Mariel Sleet know before you go for your appointment so that we can coordinate the best possible time for you in regards to your chemo regimen. You need to also let your dentist know that you are actively taking chemo. We may need to do labs prior to your dental appointment. We also want your bowels moving at least every other day. If this is not happening,  we need to know so that we can get you on a bowel regimen to help you go.       MEDICATIONS: You have been given prescriptions for the following medications:  Dexamethasone 4mg  tablet. Starting the day after chemo, take 2 tablets in the am and 2 tablets in the pm for 2 days. Then stop. Repeat this with each chemo treatment.   Zofran (ondansetron) 8mg  tablet. Starting the day after chemo, take 1 tablet in the am and 1 tablet in the pm for 2 days. Then may take 1 tablet two times a day IF needed for nausea/vomiting.     SYMPTOMS TO REPORT AS SOON AS POSSIBLE AFTER TREATMENT:  FEVER GREATER THAN 100.5 F  CHILLS WITH OR WITHOUT FEVER  NAUSEA AND VOMITING THAT IS NOT CONTROLLED WITH YOUR NAUSEA MEDICATION  UNUSUAL SHORTNESS OF BREATH  UNUSUAL BRUISING OR BLEEDING  TENDERNESS IN MOUTH AND THROAT WITH OR WITHOUT PRESENCE OF ULCERS  URINARY PROBLEMS  BOWEL PROBLEMS  UNUSUAL RASH    Wear comfortable clothing and clothing appropriate for easy access to any Portacath or PICC line. Let us know if there is anything that we can do to make your therapy better!      I have been informed and understand all of the instructions given to me and have received a copy. I have been instructed to call the clinic [phone] or my family physician as soon as possible for continued medical care, if indicated. I do not have any more questions at this time but understand that I may call the Cancer Center or the Patient Navigator at [phone] during office hours should I have questions or need assistance in obtaining follow-up care.      _________________________________________      _______________     __________ Signature of Patient or Authorized Representative        Date                            Time      _________________________________________ Nurse's Signature

## 2012-01-16 ENCOUNTER — Encounter (HOSPITAL_COMMUNITY): Payer: Medicare Other

## 2012-01-16 ENCOUNTER — Encounter (HOSPITAL_COMMUNITY): Payer: Medicare Other | Attending: Oncology

## 2012-01-16 VITALS — BP 157/68 | HR 84 | Temp 97.7°F | Resp 16 | Wt 180.4 lb

## 2012-01-16 DIAGNOSIS — Z5112 Encounter for antineoplastic immunotherapy: Secondary | ICD-10-CM

## 2012-01-16 DIAGNOSIS — C189 Malignant neoplasm of colon, unspecified: Secondary | ICD-10-CM | POA: Insufficient documentation

## 2012-01-16 DIAGNOSIS — C19 Malignant neoplasm of rectosigmoid junction: Secondary | ICD-10-CM

## 2012-01-16 DIAGNOSIS — C787 Secondary malignant neoplasm of liver and intrahepatic bile duct: Secondary | ICD-10-CM

## 2012-01-16 DIAGNOSIS — Z5111 Encounter for antineoplastic chemotherapy: Secondary | ICD-10-CM

## 2012-01-16 LAB — COMPREHENSIVE METABOLIC PANEL
ALT: 13 U/L (ref 0–35)
Albumin: 3.5 g/dL (ref 3.5–5.2)
Alkaline Phosphatase: 54 U/L (ref 39–117)
BUN: 15 mg/dL (ref 6–23)
Calcium: 9.5 mg/dL (ref 8.4–10.5)
GFR calc Af Amer: 78 mL/min — ABNORMAL LOW (ref >90)
Potassium: 3.9 mEq/L (ref 3.5–5.1)
Sodium: 137 mEq/L (ref 135–145)
Total Protein: 6.7 g/dL (ref 6.0–8.3)

## 2012-01-16 LAB — CBC WITH DIFFERENTIAL/PLATELET
Basophils Absolute: 0 10*3/uL (ref 0.0–0.1)
Eosinophils Relative: 2 % (ref 0–5)
HCT: 36.3 % (ref 36.0–46.0)
Hemoglobin: 12.2 g/dL (ref 12.0–15.0)
Lymphocytes Relative: 25 % (ref 12–46)
MCV: 95 fL (ref 78.0–100.0)
Monocytes Absolute: 0.5 10*3/uL (ref 0.1–1.0)
Monocytes Relative: 7 % (ref 3–12)
Neutro Abs: 5.1 10*3/uL (ref 1.7–7.7)
RDW: 13.2 % (ref 11.5–15.5)
WBC: 7.7 10*3/uL (ref 4.0–10.5)

## 2012-01-16 MED ORDER — IRINOTECAN HCL CHEMO INJECTION 100 MG/5ML
180.0000 mg/m2 | Freq: Once | INTRAVENOUS | Status: AC
  Administered 2012-01-16: 348 mg via INTRAVENOUS
  Filled 2012-01-16: qty 17.4

## 2012-01-16 MED ORDER — LEUCOVORIN CALCIUM INJECTION 350 MG: 400.0000 mg/m2 | Freq: Once | INTRAVENOUS | Status: DC

## 2012-01-16 MED ORDER — LORAZEPAM 2 MG/ML IJ SOLN
INTRAMUSCULAR | Status: AC
  Filled 2012-01-16: qty 1

## 2012-01-16 MED ORDER — SODIUM CHLORIDE 0.9 % IV SOLN
Freq: Once | INTRAVENOUS | Status: AC
  Administered 2012-01-16: 09:00:00 via INTRAVENOUS

## 2012-01-16 MED ORDER — SODIUM CHLORIDE 0.9 % IV SOLN
Freq: Once | INTRAVENOUS | Status: AC
  Administered 2012-01-16: 18 mg via INTRAVENOUS
  Filled 2012-01-16: qty 8

## 2012-01-16 MED ORDER — LEUCOVORIN CALCIUM INJECTION 100 MG
20.0000 mg/m2 | Freq: Once | INTRAMUSCULAR | Status: AC
  Administered 2012-01-16: 38 mg via INTRAVENOUS
  Filled 2012-01-16: qty 1.9

## 2012-01-16 MED ORDER — LORAZEPAM 2 MG/ML IJ SOLN
1.0000 mg | Freq: Once | INTRAMUSCULAR | Status: AC
  Administered 2012-01-16: 1 mg via INTRAVENOUS

## 2012-01-16 MED ORDER — SODIUM CHLORIDE 0.9 % IV SOLN
5.0000 mg/kg | Freq: Once | INTRAVENOUS | Status: AC
  Administered 2012-01-16: 400 mg via INTRAVENOUS
  Filled 2012-01-16: qty 16

## 2012-01-16 MED ORDER — SODIUM CHLORIDE 0.9 % IJ SOLN
10.0000 mL | INTRAMUSCULAR | Status: DC | PRN
  Administered 2012-01-16: 10 mL
  Filled 2012-01-16: qty 10

## 2012-01-16 MED ORDER — HEPARIN SOD (PORK) LOCK FLUSH 100 UNIT/ML IV SOLN
500.0000 [IU] | Freq: Once | INTRAVENOUS | Status: DC | PRN
  Filled 2012-01-16: qty 5

## 2012-01-16 MED ORDER — SODIUM CHLORIDE 0.9 % IV SOLN
2400.0000 mg/m2 | INTRAVENOUS | Status: DC
  Administered 2012-01-16: 4650 mg via INTRAVENOUS
  Filled 2012-01-16 (×2): qty 93

## 2012-01-16 MED ORDER — FLUOROURACIL CHEMO INJECTION 2.5 GM/50ML
400.0000 mg/m2 | Freq: Once | INTRAVENOUS | Status: AC
  Administered 2012-01-16: 750 mg via INTRAVENOUS
  Filled 2012-01-16: qty 15

## 2012-01-16 NOTE — Progress Notes (Signed)
Tolerated chemo well.  Expresses relief from nausea.

## 2012-01-16 NOTE — Progress Notes (Signed)
Chemo teaching done and consent signed for Irinotecan, leucovorin, 42fu, avastin. Paperwork faxed to [facility].  Med and chemo calendar mailed prior to appt.

## 2012-01-17 ENCOUNTER — Telehealth (HOSPITAL_COMMUNITY): Payer: Self-pay

## 2012-01-17 NOTE — Telephone Encounter (Signed)
Stated did well with chemotherapy yesterday. Aside from fatigue, no problems. Urged to call clinic with any problems, questions, or concerns.

## 2012-01-18 ENCOUNTER — Encounter (HOSPITAL_BASED_OUTPATIENT_CLINIC_OR_DEPARTMENT_OTHER): Payer: Medicare Other

## 2012-01-18 DIAGNOSIS — C189 Malignant neoplasm of colon, unspecified: Secondary | ICD-10-CM

## 2012-01-18 DIAGNOSIS — C19 Malignant neoplasm of rectosigmoid junction: Secondary | ICD-10-CM

## 2012-01-18 DIAGNOSIS — Z452 Encounter for adjustment and management of vascular access device: Secondary | ICD-10-CM

## 2012-01-18 MED ORDER — SODIUM CHLORIDE 0.9 % IJ SOLN
10.0000 mL | INTRAMUSCULAR | Status: DC | PRN
  Administered 2012-01-18: 10 mL
  Filled 2012-01-18: qty 10

## 2012-01-18 MED ORDER — HEPARIN SOD (PORK) LOCK FLUSH 100 UNIT/ML IV SOLN
500.0000 [IU] | Freq: Once | INTRAVENOUS | Status: AC | PRN
  Administered 2012-01-18: 500 [IU]
  Filled 2012-01-18: qty 5

## 2012-01-18 MED ORDER — HEPARIN SOD (PORK) LOCK FLUSH 100 UNIT/ML IV SOLN
INTRAVENOUS | Status: AC
  Filled 2012-01-18: qty 5

## 2012-01-18 NOTE — Progress Notes (Signed)
[name] presented for Portacath deaccessed after continous pump d/ced and flush. Portacath located lt chest wall deaccessed from  H 20 needle. Good blood return present. Portacath flushed with 20ml NS and 500U/33ml Heparin and needle removed intact. Procedure without incident. Patient tolerated procedure well. No problems post chemo.

## 2012-01-24 ENCOUNTER — Other Ambulatory Visit: Payer: Self-pay | Admitting: Interventional Radiology

## 2012-01-30 ENCOUNTER — Encounter (HOSPITAL_BASED_OUTPATIENT_CLINIC_OR_DEPARTMENT_OTHER): Payer: Medicare Other

## 2012-01-30 VITALS — BP 179/85 | HR 65 | Temp 97.9°F | Resp 18 | Wt 179.6 lb

## 2012-01-30 DIAGNOSIS — C19 Malignant neoplasm of rectosigmoid junction: Secondary | ICD-10-CM

## 2012-01-30 DIAGNOSIS — C189 Malignant neoplasm of colon, unspecified: Secondary | ICD-10-CM

## 2012-01-30 LAB — URINALYSIS, DIPSTICK ONLY
Hgb urine dipstick: NEGATIVE
Nitrite: NEGATIVE
Specific Gravity, Urine: 1.025 (ref 1.005–1.030)
Urobilinogen, UA: 0.2 mg/dL (ref 0.0–1.0)
pH: 6 (ref 5.0–8.0)

## 2012-01-30 LAB — CBC WITH DIFFERENTIAL/PLATELET
Basophils Absolute: 0 10*3/uL (ref 0.0–0.1)
Eosinophils Relative: 7 % — ABNORMAL HIGH (ref 0–5)
Lymphocytes Relative: 59 % — ABNORMAL HIGH (ref 12–46)
Lymphs Abs: 1.9 10*3/uL (ref 0.7–4.0)
MCV: 95 fL (ref 78.0–100.0)
Neutrophils Relative %: 20 % — ABNORMAL LOW (ref 43–77)
Platelets: 234 10*3/uL (ref 150–400)
RBC: 3.83 MIL/uL — ABNORMAL LOW (ref 3.87–5.11)
RDW: 13.1 % (ref 11.5–15.5)
WBC: 3.3 10*3/uL — ABNORMAL LOW (ref 4.0–10.5)

## 2012-01-30 LAB — COMPREHENSIVE METABOLIC PANEL
Albumin: 3.5 g/dL (ref 3.5–5.2)
Alkaline Phosphatase: 53 U/L (ref 39–117)
BUN: 9 mg/dL (ref 6–23)
CO2: 29 mEq/L (ref 19–32)
Chloride: 100 mEq/L (ref 96–112)
Creatinine, Ser: 0.68 mg/dL (ref 0.50–1.10)
GFR calc Af Amer: >90 mL/min (ref >90)
GFR calc non Af Amer: 87 mL/min — ABNORMAL LOW (ref >90)
Glucose, Bld: 148 mg/dL — ABNORMAL HIGH (ref 70–99)
Potassium: 3.8 mEq/L (ref 3.5–5.1)
Total Bilirubin: 0.3 mg/dL (ref 0.3–1.2)

## 2012-01-30 MED ORDER — HEPARIN SOD (PORK) LOCK FLUSH 100 UNIT/ML IV SOLN
INTRAVENOUS | Status: AC
  Filled 2012-01-30: qty 5

## 2012-01-30 NOTE — Progress Notes (Signed)
Anc 700.  Chemo held today.  Will try to treat in one week.

## 2012-02-01 ENCOUNTER — Encounter (HOSPITAL_COMMUNITY): Payer: Medicare Other

## 2012-02-01 ENCOUNTER — Other Ambulatory Visit (HOSPITAL_COMMUNITY): Payer: Medicare Other

## 2012-02-06 ENCOUNTER — Encounter (HOSPITAL_COMMUNITY): Payer: Self-pay

## 2012-02-06 ENCOUNTER — Encounter (HOSPITAL_COMMUNITY): Payer: Self-pay | Admitting: Oncology

## 2012-02-06 ENCOUNTER — Encounter (HOSPITAL_BASED_OUTPATIENT_CLINIC_OR_DEPARTMENT_OTHER): Payer: Medicare Other | Admitting: Oncology

## 2012-02-06 ENCOUNTER — Encounter (HOSPITAL_COMMUNITY): Payer: Medicare Other | Attending: Oncology

## 2012-02-06 VITALS — BP 168/89 | HR 66 | Temp 97.0°F | Resp 18 | Wt 178.0 lb

## 2012-02-06 DIAGNOSIS — Z5111 Encounter for antineoplastic chemotherapy: Secondary | ICD-10-CM

## 2012-02-06 DIAGNOSIS — R110 Nausea: Secondary | ICD-10-CM

## 2012-02-06 DIAGNOSIS — C787 Secondary malignant neoplasm of liver and intrahepatic bile duct: Secondary | ICD-10-CM

## 2012-02-06 DIAGNOSIS — C7800 Secondary malignant neoplasm of unspecified lung: Secondary | ICD-10-CM

## 2012-02-06 DIAGNOSIS — C19 Malignant neoplasm of rectosigmoid junction: Secondary | ICD-10-CM

## 2012-02-06 DIAGNOSIS — C189 Malignant neoplasm of colon, unspecified: Secondary | ICD-10-CM | POA: Insufficient documentation

## 2012-02-06 HISTORY — DX: Nausea: R11.0

## 2012-02-06 LAB — COMPREHENSIVE METABOLIC PANEL
ALT: 14 U/L (ref 0–35)
Albumin: 3.5 g/dL (ref 3.5–5.2)
Alkaline Phosphatase: 54 U/L (ref 39–117)
Chloride: 101 mEq/L (ref 96–112)
Glucose, Bld: 204 mg/dL — ABNORMAL HIGH (ref 70–99)
Potassium: 3.9 mEq/L (ref 3.5–5.1)
Sodium: 136 mEq/L (ref 135–145)
Total Bilirubin: 0.3 mg/dL (ref 0.3–1.2)
Total Protein: 6.6 g/dL (ref 6.0–8.3)

## 2012-02-06 LAB — CBC WITH DIFFERENTIAL/PLATELET
Basophils Absolute: 0.1 10*3/uL (ref 0.0–0.1)
Basophils Relative: 2 % — ABNORMAL HIGH (ref 0–1)
Eosinophils Absolute: 0.1 10*3/uL (ref 0.0–0.7)
Eosinophils Relative: 1 % (ref 0–5)
HCT: 38.2 % (ref 36.0–46.0)
MCHC: 33 g/dL (ref 30.0–36.0)
MCV: 96.5 fL (ref 78.0–100.0)
Monocytes Absolute: 0.5 10*3/uL (ref 0.1–1.0)
RDW: 13.7 % (ref 11.5–15.5)

## 2012-02-06 MED ORDER — BEVACIZUMAB CHEMO INJECTION 400 MG/16ML
5.0000 mg/kg | Freq: Once | INTRAVENOUS | Status: AC
  Administered 2012-02-06: 400 mg via INTRAVENOUS
  Filled 2012-02-06: qty 16

## 2012-02-06 MED ORDER — LEUCOVORIN CALCIUM INJECTION 350 MG: 400.0000 mg/m2 | Freq: Once | INTRAMUSCULAR | Status: DC

## 2012-02-06 MED ORDER — SODIUM CHLORIDE 0.9 % IV SOLN
2400.0000 mg/m2 | INTRAVENOUS | Status: DC
  Administered 2012-02-06: 4650 mg via INTRAVENOUS
  Filled 2012-02-06 (×2): qty 93

## 2012-02-06 MED ORDER — IRINOTECAN HCL CHEMO INJECTION 100 MG/5ML
180.0000 mg/m2 | Freq: Once | INTRAVENOUS | Status: AC
  Administered 2012-02-06: 348 mg via INTRAVENOUS
  Filled 2012-02-06: qty 17.4

## 2012-02-06 MED ORDER — SODIUM CHLORIDE 0.9 % IV SOLN
Freq: Once | INTRAVENOUS | Status: AC
  Administered 2012-02-06: 10:00:00 via INTRAVENOUS

## 2012-02-06 MED ORDER — LORAZEPAM 2 MG/ML IJ SOLN
INTRAMUSCULAR | Status: AC
  Filled 2012-02-06: qty 1

## 2012-02-06 MED ORDER — LORAZEPAM 2 MG/ML IJ SOLN
1.0000 mg | Freq: Once | INTRAMUSCULAR | Status: AC
  Administered 2012-02-06: 1 mg via INTRAVENOUS

## 2012-02-06 MED ORDER — SODIUM CHLORIDE 0.9 % IV SOLN
Freq: Once | INTRAVENOUS | Status: AC
  Administered 2012-02-06: 16 mg via INTRAVENOUS
  Filled 2012-02-06: qty 8

## 2012-02-06 MED ORDER — FLUOROURACIL CHEMO INJECTION 2.5 GM/50ML
400.0000 mg/m2 | Freq: Once | INTRAVENOUS | Status: AC
  Administered 2012-02-06: 750 mg via INTRAVENOUS
  Filled 2012-02-06: qty 15

## 2012-02-06 MED ORDER — SODIUM CHLORIDE 0.9 % IJ SOLN
10.0000 mL | INTRAMUSCULAR | Status: DC | PRN
Start: 2012-02-06 — End: 2012-02-06
  Administered 2012-02-06: 10 mL
  Filled 2012-02-06: qty 10

## 2012-02-06 MED ORDER — LEUCOVORIN CALCIUM INJECTION 100 MG
20.0000 mg/m2 | Freq: Once | INTRAMUSCULAR | Status: AC
  Administered 2012-02-06: 38 mg via INTRAVENOUS
  Filled 2012-02-06: qty 1.9

## 2012-02-06 NOTE — Progress Notes (Signed)
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 She is now status post RFA ablation initially to a single liver lesion in August 2012. She then completed 6 full cycles of FOLFOX/bevacizumab. This last cycle was 04/03/2011. She was then discovered to have a rising CEA level and scans revealed the above-mentioned multiple areas of involvement.  She is doing better with this treatment she states that with the FOLFOX. She is now on Folfiri Plus bevacizumab.  She has a slight amount of nausea for 2 days. Her sugars also sometimes over 400 so she will increase her combination metformin pill to 2 twice a day on those days and then back down. She is slightly weak and tired but otherwise doing well. No diarrhea thus far with this chemotherapy regimen.  Her CEA level is pending. We will check her PET scan after 3 full cycles of therapy

## 2012-02-06 NOTE — Progress Notes (Signed)
1250 C/o nausea after eating small amt of lunch. Ativan 1 mg given IV per Dr. Carlena Bjornstad. 1315 states nausea  Is much better now.Denies "rolling in stomach" that she had felt earlier. 1428 Tolerated chemo well.home with 61fu continuous infusion in progress.

## 2012-02-07 ENCOUNTER — Ambulatory Visit (HOSPITAL_COMMUNITY): Payer: Medicare Other | Admitting: Oncology

## 2012-02-08 ENCOUNTER — Encounter (HOSPITAL_BASED_OUTPATIENT_CLINIC_OR_DEPARTMENT_OTHER): Payer: Medicare Other

## 2012-02-08 VITALS — BP 141/91 | HR 76 | Temp 98.1°F | Resp 18

## 2012-02-08 DIAGNOSIS — C7800 Secondary malignant neoplasm of unspecified lung: Secondary | ICD-10-CM

## 2012-02-08 DIAGNOSIS — C19 Malignant neoplasm of rectosigmoid junction: Secondary | ICD-10-CM

## 2012-02-08 DIAGNOSIS — C787 Secondary malignant neoplasm of liver and intrahepatic bile duct: Secondary | ICD-10-CM

## 2012-02-08 DIAGNOSIS — Z452 Encounter for adjustment and management of vascular access device: Secondary | ICD-10-CM

## 2012-02-08 DIAGNOSIS — C189 Malignant neoplasm of colon, unspecified: Secondary | ICD-10-CM

## 2012-02-08 MED ORDER — HEPARIN SOD (PORK) LOCK FLUSH 100 UNIT/ML IV SOLN
INTRAVENOUS | Status: AC
  Filled 2012-02-08: qty 5

## 2012-02-08 MED ORDER — HEPARIN SOD (PORK) LOCK FLUSH 100 UNIT/ML IV SOLN
500.0000 [IU] | Freq: Once | INTRAVENOUS | Status: AC | PRN
  Administered 2012-02-08: 500 [IU]
  Filled 2012-02-08: qty 5

## 2012-02-08 MED ORDER — SODIUM CHLORIDE 0.9 % IJ SOLN
10.0000 mL | INTRAMUSCULAR | Status: DC | PRN
  Administered 2012-02-08: 10 mL
  Filled 2012-02-08: qty 10

## 2012-02-08 NOTE — Progress Notes (Signed)
D/C continuous infusion pump. Flushed port per protocol. Pt denies complaints. 

## 2012-02-13 ENCOUNTER — Inpatient Hospital Stay (HOSPITAL_COMMUNITY): Payer: Medicare Other

## 2012-02-15 ENCOUNTER — Encounter (HOSPITAL_COMMUNITY): Payer: Medicare Other

## 2012-02-20 ENCOUNTER — Encounter (HOSPITAL_COMMUNITY): Payer: Self-pay | Admitting: Oncology

## 2012-02-20 ENCOUNTER — Encounter (HOSPITAL_COMMUNITY): Payer: Medicare Other

## 2012-02-20 VITALS — BP 161/77 | HR 69 | Temp 97.7°F | Resp 18 | Wt 176.4 lb

## 2012-02-20 DIAGNOSIS — C189 Malignant neoplasm of colon, unspecified: Secondary | ICD-10-CM

## 2012-02-20 LAB — COMPREHENSIVE METABOLIC PANEL
ALT: 13 U/L (ref 0–35)
AST: 12 U/L (ref 0–37)
Alkaline Phosphatase: 50 U/L (ref 39–117)
CO2: 26 mEq/L (ref 19–32)
Calcium: 9.4 mg/dL (ref 8.4–10.5)
Chloride: 102 mEq/L (ref 96–112)
GFR calc non Af Amer: 84 mL/min — ABNORMAL LOW (ref >90)
Glucose, Bld: 210 mg/dL — ABNORMAL HIGH (ref 70–99)
Potassium: 3.8 mEq/L (ref 3.5–5.1)
Sodium: 138 mEq/L (ref 135–145)

## 2012-02-20 LAB — CBC WITH DIFFERENTIAL/PLATELET
Basophils Absolute: 0 10*3/uL (ref 0.0–0.1)
Basophils Relative: 1 % (ref 0–1)
Eosinophils Absolute: 0.3 10*3/uL (ref 0.0–0.7)
HCT: 34.7 % — ABNORMAL LOW (ref 36.0–46.0)
Hemoglobin: 11.7 g/dL — ABNORMAL LOW (ref 12.0–15.0)
MCH: 32.1 pg (ref 26.0–34.0)
MCHC: 33.7 g/dL (ref 30.0–36.0)
Monocytes Absolute: 0.5 10*3/uL (ref 0.1–1.0)
Monocytes Relative: 17 % — ABNORMAL HIGH (ref 3–12)
Neutrophils Relative %: 23 % — ABNORMAL LOW (ref 43–77)
RDW: 13.9 % (ref 11.5–15.5)

## 2012-02-20 MED ORDER — LORAZEPAM 0.5 MG PO TABS: 0.5000 mg | ORAL_TABLET | Freq: Four times a day (QID) | ORAL | Status: DC | PRN

## 2012-02-20 MED ORDER — HEPARIN SOD (PORK) LOCK FLUSH 100 UNIT/ML IV SOLN
INTRAVENOUS | Status: AC
  Filled 2012-02-20: qty 5

## 2012-02-20 NOTE — Progress Notes (Signed)
Chemo held today due to low ANC. Will try again in one week.  Doctor Neijstrom to add Neulasta to treatment plan.

## 2012-02-21 ENCOUNTER — Telehealth (HOSPITAL_COMMUNITY): Payer: Self-pay | Admitting: Oncology

## 2012-02-22 ENCOUNTER — Encounter (HOSPITAL_COMMUNITY): Payer: Medicare Other

## 2012-02-27 ENCOUNTER — Inpatient Hospital Stay (HOSPITAL_COMMUNITY): Payer: Medicare Other

## 2012-02-28 ENCOUNTER — Encounter (HOSPITAL_BASED_OUTPATIENT_CLINIC_OR_DEPARTMENT_OTHER): Payer: Medicare Other

## 2012-02-28 VITALS — BP 160/97 | HR 93 | Temp 97.0°F | Resp 16 | Wt 173.6 lb

## 2012-02-28 DIAGNOSIS — C19 Malignant neoplasm of rectosigmoid junction: Secondary | ICD-10-CM

## 2012-02-28 DIAGNOSIS — Z5112 Encounter for antineoplastic immunotherapy: Secondary | ICD-10-CM

## 2012-02-28 DIAGNOSIS — C787 Secondary malignant neoplasm of liver and intrahepatic bile duct: Secondary | ICD-10-CM

## 2012-02-28 DIAGNOSIS — R110 Nausea: Secondary | ICD-10-CM

## 2012-02-28 DIAGNOSIS — C189 Malignant neoplasm of colon, unspecified: Secondary | ICD-10-CM

## 2012-02-28 DIAGNOSIS — Z5111 Encounter for antineoplastic chemotherapy: Secondary | ICD-10-CM

## 2012-02-28 LAB — CBC WITH DIFFERENTIAL/PLATELET
Basophils Absolute: 0.1 10*3/uL (ref 0.0–0.1)
Basophils Relative: 1 % (ref 0–1)
Eosinophils Absolute: 0.1 10*3/uL (ref 0.0–0.7)
MCH: 32.3 pg (ref 26.0–34.0)
MCHC: 33.3 g/dL (ref 30.0–36.0)
Neutrophils Relative %: 60 % (ref 43–77)
Platelets: 293 10*3/uL (ref 150–400)

## 2012-02-28 LAB — COMPREHENSIVE METABOLIC PANEL
ALT: 14 U/L (ref 0–35)
AST: 13 U/L (ref 0–37)
Albumin: 3.8 g/dL (ref 3.5–5.2)
Alkaline Phosphatase: 63 U/L (ref 39–117)
Potassium: 4 mEq/L (ref 3.5–5.1)
Sodium: 135 mEq/L (ref 135–145)
Total Protein: 7.1 g/dL (ref 6.0–8.3)

## 2012-02-28 LAB — CEA: CEA: 5.7 ng/mL — ABNORMAL HIGH (ref 0.0–5.0)

## 2012-02-28 MED ORDER — LEUCOVORIN CALCIUM INJECTION 100 MG
20.0000 mg/m2 | Freq: Once | INTRAMUSCULAR | Status: AC
  Administered 2012-02-28: 38 mg via INTRAVENOUS
  Filled 2012-02-28: qty 1.9

## 2012-02-28 MED ORDER — SODIUM CHLORIDE 0.9 % IV SOLN
5.0000 mg/kg | Freq: Once | INTRAVENOUS | Status: AC
  Administered 2012-02-28: 400 mg via INTRAVENOUS
  Filled 2012-02-28: qty 16

## 2012-02-28 MED ORDER — LEUCOVORIN CALCIUM INJECTION 350 MG: 400.0000 mg/m2 | Freq: Once | INTRAVENOUS | Status: DC

## 2012-02-28 MED ORDER — SODIUM CHLORIDE 0.9 % IJ SOLN
10.0000 mL | INTRAMUSCULAR | Status: DC | PRN
  Administered 2012-02-28: 10 mL
  Filled 2012-02-28: qty 10

## 2012-02-28 MED ORDER — SODIUM CHLORIDE 0.9 % IV SOLN
2400.0000 mg/m2 | INTRAVENOUS | Status: DC
  Administered 2012-02-28: 4650 mg via INTRAVENOUS
  Filled 2012-02-28 (×2): qty 93

## 2012-02-28 MED ORDER — IRINOTECAN HCL CHEMO INJECTION 100 MG/5ML
180.0000 mg/m2 | Freq: Once | INTRAVENOUS | Status: AC
  Administered 2012-02-28: 348 mg via INTRAVENOUS
  Filled 2012-02-28: qty 17.4

## 2012-02-28 MED ORDER — LORAZEPAM 2 MG/ML IJ SOLN
0.5000 mg | Freq: Once | INTRAMUSCULAR | Status: AC
  Administered 2012-02-28: 0.5 mg via INTRAVENOUS

## 2012-02-28 MED ORDER — SODIUM CHLORIDE 0.9 % IV SOLN
Freq: Once | INTRAVENOUS | Status: AC
  Administered 2012-02-28: 16 mg via INTRAVENOUS
  Filled 2012-02-28: qty 8

## 2012-02-28 MED ORDER — LORAZEPAM 2 MG/ML IJ SOLN
INTRAMUSCULAR | Status: AC
  Filled 2012-02-28: qty 1

## 2012-02-28 MED ORDER — SODIUM CHLORIDE 0.9 % IV SOLN
Freq: Once | INTRAVENOUS | Status: AC
  Administered 2012-02-28: 11:00:00 via INTRAVENOUS

## 2012-02-28 MED ORDER — FLUOROURACIL CHEMO INJECTION 2.5 GM/50ML
400.0000 mg/m2 | Freq: Once | INTRAVENOUS | Status: AC
  Administered 2012-02-28: 750 mg via INTRAVENOUS
  Filled 2012-02-28: qty 15

## 2012-02-28 MED ORDER — LORAZEPAM 2 MG/ML IJ SOLN: 0.5000 mg | Freq: Once | INTRAMUSCULAR | Status: DC

## 2012-02-28 NOTE — Progress Notes (Signed)
Ativan 0.5 mg given IV for c/o nausea.1325.\[id number]/\ 1400  States nausea has eased. For the most part, tolerated chemo well.  Home with continuous infusion pump.

## 2012-02-29 ENCOUNTER — Encounter (HOSPITAL_COMMUNITY): Payer: Medicare Other

## 2012-03-01 ENCOUNTER — Encounter (HOSPITAL_BASED_OUTPATIENT_CLINIC_OR_DEPARTMENT_OTHER): Payer: Medicare Other

## 2012-03-01 DIAGNOSIS — C7800 Secondary malignant neoplasm of unspecified lung: Secondary | ICD-10-CM

## 2012-03-01 DIAGNOSIS — C19 Malignant neoplasm of rectosigmoid junction: Secondary | ICD-10-CM

## 2012-03-01 DIAGNOSIS — C189 Malignant neoplasm of colon, unspecified: Secondary | ICD-10-CM

## 2012-03-01 DIAGNOSIS — C787 Secondary malignant neoplasm of liver and intrahepatic bile duct: Secondary | ICD-10-CM

## 2012-03-01 MED ORDER — HEPARIN SOD (PORK) LOCK FLUSH 100 UNIT/ML IV SOLN
500.0000 [IU] | Freq: Once | INTRAVENOUS | Status: AC | PRN
  Administered 2012-03-01: 500 [IU]
  Filled 2012-03-01: qty 5

## 2012-03-01 MED ORDER — PEGFILGRASTIM INJECTION 6 MG/0.6ML
SUBCUTANEOUS | Status: AC
  Filled 2012-03-01: qty 0.6

## 2012-03-01 MED ORDER — SODIUM CHLORIDE 0.9 % IJ SOLN
10.0000 mL | INTRAMUSCULAR | Status: DC | PRN
  Administered 2012-03-01: 10 mL
  Filled 2012-03-01: qty 10

## 2012-03-01 MED ORDER — PEGFILGRASTIM INJECTION 6 MG/0.6ML
6.0000 mg | Freq: Once | SUBCUTANEOUS | Status: AC
  Administered 2012-03-01: 6 mg via SUBCUTANEOUS

## 2012-03-01 MED ORDER — HEPARIN SOD (PORK) LOCK FLUSH 100 UNIT/ML IV SOLN
INTRAVENOUS | Status: AC
  Filled 2012-03-01: qty 5

## 2012-03-01 NOTE — Progress Notes (Signed)
[name] presents today for injection per MD orders. Neulasta 6mg  administered SQ in left Abdomen. Administration without incident. Patient tolerated well.

## 2012-03-13 ENCOUNTER — Encounter (HOSPITAL_COMMUNITY): Payer: Medicare Other | Attending: Oncology

## 2012-03-13 VITALS — BP 135/66 | HR 67 | Temp 98.2°F | Resp 20 | Wt 175.8 lb

## 2012-03-13 DIAGNOSIS — R110 Nausea: Secondary | ICD-10-CM | POA: Insufficient documentation

## 2012-03-13 DIAGNOSIS — Z5112 Encounter for antineoplastic immunotherapy: Secondary | ICD-10-CM

## 2012-03-13 DIAGNOSIS — C189 Malignant neoplasm of colon, unspecified: Secondary | ICD-10-CM

## 2012-03-13 DIAGNOSIS — C19 Malignant neoplasm of rectosigmoid junction: Secondary | ICD-10-CM

## 2012-03-13 DIAGNOSIS — C787 Secondary malignant neoplasm of liver and intrahepatic bile duct: Secondary | ICD-10-CM

## 2012-03-13 LAB — COMPREHENSIVE METABOLIC PANEL
BUN: 7 mg/dL (ref 6–23)
CO2: 24 mEq/L (ref 19–32)
Chloride: 99 mEq/L (ref 96–112)
Creatinine, Ser: 0.67 mg/dL (ref 0.50–1.10)
GFR calc Af Amer: >90 mL/min (ref >90)
GFR calc non Af Amer: 87 mL/min — ABNORMAL LOW (ref >90)
Total Bilirubin: 0.2 mg/dL — ABNORMAL LOW (ref 0.3–1.2)

## 2012-03-13 LAB — URINALYSIS, DIPSTICK ONLY
Bilirubin Urine: NEGATIVE
Ketones, ur: NEGATIVE mg/dL
Nitrite: NEGATIVE
Protein, ur: NEGATIVE mg/dL
Specific Gravity, Urine: 1.02 (ref 1.005–1.030)
Urobilinogen, UA: 0.2 mg/dL (ref 0.0–1.0)

## 2012-03-13 LAB — CBC WITH DIFFERENTIAL/PLATELET
Lymphocytes Relative: 17 % (ref 12–46)
Lymphs Abs: 1.5 10*3/uL (ref 0.7–4.0)
Neutrophils Relative %: 75 % (ref 43–77)
Platelets: 217 10*3/uL (ref 150–400)
RBC: 3.46 MIL/uL — ABNORMAL LOW (ref 3.87–5.11)
WBC: 9.1 10*3/uL (ref 4.0–10.5)

## 2012-03-13 MED ORDER — SODIUM CHLORIDE 0.9 % IV SOLN
2400.0000 mg/m2 | INTRAVENOUS | Status: DC
  Administered 2012-03-13: 4650 mg via INTRAVENOUS
  Filled 2012-03-13 (×2): qty 93

## 2012-03-13 MED ORDER — LORAZEPAM 2 MG/ML IJ SOLN
0.5000 mg | Freq: Once | INTRAMUSCULAR | Status: AC
  Administered 2012-03-13: 0.5 mg via INTRAVENOUS

## 2012-03-13 MED ORDER — IRINOTECAN HCL CHEMO INJECTION 100 MG/5ML
180.0000 mg/m2 | Freq: Once | INTRAVENOUS | Status: AC
  Administered 2012-03-13: 348 mg via INTRAVENOUS
  Filled 2012-03-13: qty 17.4

## 2012-03-13 MED ORDER — FLUOROURACIL CHEMO INJECTION 2.5 GM/50ML
400.0000 mg/m2 | Freq: Once | INTRAVENOUS | Status: AC
  Administered 2012-03-13: 750 mg via INTRAVENOUS
  Filled 2012-03-13: qty 15

## 2012-03-13 MED ORDER — SODIUM CHLORIDE 0.9 % IJ SOLN
10.0000 mL | INTRAMUSCULAR | Status: DC | PRN
  Filled 2012-03-13: qty 10

## 2012-03-13 MED ORDER — LEUCOVORIN CALCIUM INJECTION 350 MG: 400.0000 mg/m2 | Freq: Once | INTRAVENOUS | Status: DC

## 2012-03-13 MED ORDER — SODIUM CHLORIDE 0.9 % IV SOLN
Freq: Once | INTRAVENOUS | Status: AC
  Administered 2012-03-13: 16 mg via INTRAVENOUS
  Filled 2012-03-13: qty 8

## 2012-03-13 MED ORDER — SODIUM CHLORIDE 0.9 % IV SOLN
Freq: Once | INTRAVENOUS | Status: AC
  Administered 2012-03-13: 11:00:00 via INTRAVENOUS

## 2012-03-13 MED ORDER — LEUCOVORIN CALCIUM INJECTION 100 MG
20.0000 mg/m2 | Freq: Once | INTRAMUSCULAR | Status: AC
  Administered 2012-03-13: 38 mg via INTRAVENOUS
  Filled 2012-03-13: qty 1.9

## 2012-03-13 MED ORDER — LORAZEPAM 2 MG/ML IJ SOLN
INTRAMUSCULAR | Status: AC
  Filled 2012-03-13: qty 1

## 2012-03-13 MED ORDER — SODIUM CHLORIDE 0.9 % IV SOLN
5.0000 mg/kg | Freq: Once | INTRAVENOUS | Status: AC
  Administered 2012-03-13: 400 mg via INTRAVENOUS
  Filled 2012-03-13: qty 16

## 2012-03-13 NOTE — Progress Notes (Signed)
1255 Ativan 0.5 mg given IV for c/o nausea and watering mouth. 1330 states she is feeling better. Tolerated remainder of infusion without problems

## 2012-03-15 ENCOUNTER — Encounter (HOSPITAL_BASED_OUTPATIENT_CLINIC_OR_DEPARTMENT_OTHER): Payer: Medicare Other

## 2012-03-15 VITALS — BP 138/65 | HR 74

## 2012-03-15 DIAGNOSIS — C189 Malignant neoplasm of colon, unspecified: Secondary | ICD-10-CM

## 2012-03-15 DIAGNOSIS — Z5189 Encounter for other specified aftercare: Secondary | ICD-10-CM

## 2012-03-15 DIAGNOSIS — C19 Malignant neoplasm of rectosigmoid junction: Secondary | ICD-10-CM

## 2012-03-15 DIAGNOSIS — C7800 Secondary malignant neoplasm of unspecified lung: Secondary | ICD-10-CM

## 2012-03-15 MED ORDER — PEGFILGRASTIM INJECTION 6 MG/0.6ML
SUBCUTANEOUS | Status: AC
  Filled 2012-03-15: qty 0.6

## 2012-03-15 MED ORDER — SODIUM CHLORIDE 0.9 % IJ SOLN
10.0000 mL | INTRAMUSCULAR | Status: DC | PRN
  Administered 2012-03-15: 10 mL via INTRAVENOUS
  Filled 2012-03-15: qty 10

## 2012-03-15 MED ORDER — HEPARIN SOD (PORK) LOCK FLUSH 100 UNIT/ML IV SOLN
INTRAVENOUS | Status: AC
  Filled 2012-03-15: qty 5

## 2012-03-15 MED ORDER — PEGFILGRASTIM INJECTION 6 MG/0.6ML
6.0000 mg | Freq: Once | SUBCUTANEOUS | Status: AC
  Administered 2012-03-15: 6 mg via SUBCUTANEOUS

## 2012-03-15 MED ORDER — HEPARIN SOD (PORK) LOCK FLUSH 100 UNIT/ML IV SOLN
500.0000 [IU] | Freq: Once | INTRAVENOUS | Status: AC
  Administered 2012-03-15: 500 [IU] via INTRAVENOUS
  Filled 2012-03-15: qty 5

## 2012-03-15 NOTE — Progress Notes (Signed)
[name] presents today for injection per MD orders. Neulasta 6mg  administered SQ in left Abdomen. Administration without incident. Patient tolerated well.  5Fu continuous infusion completed and port flushed with NS 20 ml and Heparin 500 units.

## 2012-03-25 ENCOUNTER — Encounter (HOSPITAL_BASED_OUTPATIENT_CLINIC_OR_DEPARTMENT_OTHER): Payer: Medicare Other

## 2012-03-25 ENCOUNTER — Encounter (HOSPITAL_COMMUNITY): Payer: Self-pay | Admitting: Dietician

## 2012-03-25 VITALS — BP 129/77 | HR 81 | Temp 98.1°F | Resp 18

## 2012-03-25 DIAGNOSIS — C19 Malignant neoplasm of rectosigmoid junction: Secondary | ICD-10-CM

## 2012-03-25 DIAGNOSIS — C7800 Secondary malignant neoplasm of unspecified lung: Secondary | ICD-10-CM

## 2012-03-25 DIAGNOSIS — Z5112 Encounter for antineoplastic immunotherapy: Secondary | ICD-10-CM

## 2012-03-25 DIAGNOSIS — C189 Malignant neoplasm of colon, unspecified: Secondary | ICD-10-CM

## 2012-03-25 DIAGNOSIS — Z5111 Encounter for antineoplastic chemotherapy: Secondary | ICD-10-CM

## 2012-03-25 LAB — CBC WITH DIFFERENTIAL/PLATELET
Basophils Relative: 1 % (ref 0–1)
Eosinophils Absolute: 0.2 10*3/uL (ref 0.0–0.7)
HCT: 34.3 % — ABNORMAL LOW (ref 36.0–46.0)
Hemoglobin: 11.6 g/dL — ABNORMAL LOW (ref 12.0–15.0)
Lymphs Abs: 1.7 10*3/uL (ref 0.7–4.0)
MCH: 32.6 pg (ref 26.0–34.0)
MCHC: 33.8 g/dL (ref 30.0–36.0)
Monocytes Absolute: 0.5 10*3/uL (ref 0.1–1.0)
Monocytes Relative: 8 % (ref 3–12)
Neutro Abs: 4.6 10*3/uL (ref 1.7–7.7)
Neutrophils Relative %: 66 % (ref 43–77)
RBC: 3.56 MIL/uL — ABNORMAL LOW (ref 3.87–5.11)

## 2012-03-25 LAB — COMPREHENSIVE METABOLIC PANEL
ALT: 22 U/L (ref 0–35)
AST: 15 U/L (ref 0–37)
Calcium: 8.3 mg/dL — ABNORMAL LOW (ref 8.4–10.5)
Creatinine, Ser: 0.83 mg/dL (ref 0.50–1.10)
Sodium: 132 mEq/L — ABNORMAL LOW (ref 135–145)
Total Protein: 5.5 g/dL — ABNORMAL LOW (ref 6.0–8.3)

## 2012-03-25 MED ORDER — LORAZEPAM 2 MG/ML IJ SOLN
1.0000 mg | Freq: Once | INTRAMUSCULAR | Status: AC
  Administered 2012-03-25: 1 mg via INTRAVENOUS

## 2012-03-25 MED ORDER — SODIUM CHLORIDE 0.9 % IJ SOLN
10.0000 mL | INTRAMUSCULAR | Status: DC | PRN
  Filled 2012-03-25: qty 10

## 2012-03-25 MED ORDER — IRINOTECAN HCL CHEMO INJECTION 100 MG/5ML
180.0000 mg/m2 | Freq: Once | INTRAVENOUS | Status: AC
  Administered 2012-03-25: 348 mg via INTRAVENOUS
  Filled 2012-03-25: qty 17.4

## 2012-03-25 MED ORDER — SODIUM CHLORIDE 0.9 % IV SOLN
Freq: Once | INTRAVENOUS | Status: AC
  Administered 2012-03-25: 16 mg via INTRAVENOUS
  Filled 2012-03-25: qty 8

## 2012-03-25 MED ORDER — LEUCOVORIN CALCIUM INJECTION 100 MG
20.0000 mg/m2 | Freq: Once | INTRAMUSCULAR | Status: AC
  Administered 2012-03-25: 38 mg via INTRAVENOUS
  Filled 2012-03-25: qty 1.9

## 2012-03-25 MED ORDER — HEPARIN SOD (PORK) LOCK FLUSH 100 UNIT/ML IV SOLN
500.0000 [IU] | Freq: Once | INTRAVENOUS | Status: DC | PRN
  Filled 2012-03-25: qty 5

## 2012-03-25 MED ORDER — SODIUM CHLORIDE 0.9 % IV SOLN
Freq: Once | INTRAVENOUS | Status: AC
  Administered 2012-03-25: 12:00:00 via INTRAVENOUS

## 2012-03-25 MED ORDER — SODIUM CHLORIDE 0.9 % IV SOLN
5.0000 mg/kg | Freq: Once | INTRAVENOUS | Status: AC
  Administered 2012-03-25: 400 mg via INTRAVENOUS
  Filled 2012-03-25: qty 16

## 2012-03-25 MED ORDER — SODIUM CHLORIDE 0.9 % IV SOLN
2400.0000 mg/m2 | INTRAVENOUS | Status: DC
  Administered 2012-03-25: 4650 mg via INTRAVENOUS
  Filled 2012-03-25 (×2): qty 93

## 2012-03-25 MED ORDER — LORAZEPAM 2 MG/ML IJ SOLN
INTRAMUSCULAR | Status: AC
  Filled 2012-03-25: qty 1

## 2012-03-25 MED ORDER — FLUOROURACIL CHEMO INJECTION 2.5 GM/50ML
400.0000 mg/m2 | Freq: Once | INTRAVENOUS | Status: AC
  Administered 2012-03-25: 750 mg via INTRAVENOUS
  Filled 2012-03-25: qty 15

## 2012-03-25 NOTE — Progress Notes (Signed)
Pt identified for weight loss on [facility] Nutrition Screen.   Wt Readings from Last 10 Encounters:  03/13/12 175 lb 12.8 oz (79.742 kg)  02/28/12 173 lb 9.6 oz (78.744 kg)  02/20/12 176 lb 6.4 oz (80.015 kg)  02/06/12 178 lb (80.74 kg)  02/06/12 178 lb (80.74 kg)  01/30/12 179 lb 9.6 oz (81.466 kg)  01/16/12 180 lb 6.4 oz (81.829 kg)  12/06/11 178 lb 8 oz (80.967 kg)  09/28/11 176 lb 14.4 oz (80.241 kg)  06/28/11 168 lb 12.8 oz (76.567 kg)   Chart reviewed. Pt with recurrent adenocarcinoma with mets to lung and liver. She is currently under going chemo (Foltril plus bevacizemab).  Weight hx hx shows an upward trend over the past 9 months. Noted pt has pain 13# (7.8%) x 1 year, stable x 3 months, and +2# (1.1%) x 7 months. Chart reviewed suggests that there was been a distant hx of wt loss (Md reports pt was 184# in 2011). No weight changes has been significant over the past year.   No recommendations to nutrition care at this time. If further nutrition issues arise, please consult RD.  Melody Haver, RD, LDN Pager: [phone]

## 2012-03-26 LAB — CEA: CEA: 4 ng/mL (ref 0.0–5.0)

## 2012-03-27 ENCOUNTER — Encounter (HOSPITAL_BASED_OUTPATIENT_CLINIC_OR_DEPARTMENT_OTHER): Payer: Medicare Other

## 2012-03-27 DIAGNOSIS — C19 Malignant neoplasm of rectosigmoid junction: Secondary | ICD-10-CM

## 2012-03-27 DIAGNOSIS — C7800 Secondary malignant neoplasm of unspecified lung: Secondary | ICD-10-CM

## 2012-03-27 DIAGNOSIS — C189 Malignant neoplasm of colon, unspecified: Secondary | ICD-10-CM

## 2012-03-27 MED ORDER — PEGFILGRASTIM INJECTION 6 MG/0.6ML
6.0000 mg | Freq: Once | SUBCUTANEOUS | Status: AC
  Administered 2012-03-27: 6 mg via SUBCUTANEOUS

## 2012-03-27 MED ORDER — PEGFILGRASTIM INJECTION 6 MG/0.6ML
SUBCUTANEOUS | Status: AC
  Filled 2012-03-27: qty 0.6

## 2012-03-27 MED ORDER — HEPARIN SOD (PORK) LOCK FLUSH 100 UNIT/ML IV SOLN
500.0000 [IU] | Freq: Once | INTRAVENOUS | Status: AC | PRN
  Administered 2012-03-27: 500 [IU]
  Filled 2012-03-27: qty 5

## 2012-03-27 MED ORDER — HEPARIN SOD (PORK) LOCK FLUSH 100 UNIT/ML IV SOLN
INTRAVENOUS | Status: AC
  Filled 2012-03-27: qty 5

## 2012-03-27 MED ORDER — SODIUM CHLORIDE 0.9 % IJ SOLN
10.0000 mL | INTRAMUSCULAR | Status: DC | PRN
  Administered 2012-03-27: 10 mL
  Filled 2012-03-27: qty 10

## 2012-03-27 NOTE — Progress Notes (Signed)
[name] presented for Portacath access and flush and deaccess after 5 fu completion. Portacath located t chest wall deaccessed.  Portacath flushed with 20ml NS and 500U/59ml Heparin and needle removed intact. Procedure without incident. Patient tolerated procedure well.  [name] presents today for injection per MD orders. Neulasta 6mg  administered SQ in left Abdomen. Administration without incident. Patient tolerated well.

## 2012-04-02 ENCOUNTER — Telehealth (HOSPITAL_COMMUNITY): Payer: Self-pay | Admitting: *Deleted

## 2012-04-02 NOTE — Telephone Encounter (Signed)
[name] c/o weakness and nausea but no vomitting. She is going to start zofran in addition to ativan, try to push fluids and will call us in am with how she is doing.

## 2012-04-05 ENCOUNTER — Other Ambulatory Visit (HOSPITAL_COMMUNITY): Payer: Self-pay | Admitting: *Deleted

## 2012-04-05 ENCOUNTER — Telehealth (HOSPITAL_COMMUNITY): Payer: Self-pay | Admitting: Oncology

## 2012-04-05 ENCOUNTER — Encounter (HOSPITAL_COMMUNITY): Payer: Medicare Other | Attending: Oncology

## 2012-04-05 DIAGNOSIS — R110 Nausea: Secondary | ICD-10-CM | POA: Insufficient documentation

## 2012-04-05 DIAGNOSIS — R300 Dysuria: Secondary | ICD-10-CM

## 2012-04-05 DIAGNOSIS — R309 Painful micturition, unspecified: Secondary | ICD-10-CM

## 2012-04-05 DIAGNOSIS — E876 Hypokalemia: Secondary | ICD-10-CM | POA: Insufficient documentation

## 2012-04-05 DIAGNOSIS — C189 Malignant neoplasm of colon, unspecified: Secondary | ICD-10-CM | POA: Insufficient documentation

## 2012-04-05 LAB — URINALYSIS, ROUTINE W REFLEX MICROSCOPIC
Leukocytes, UA: NEGATIVE
Nitrite: NEGATIVE
Protein, ur: NEGATIVE mg/dL
Urobilinogen, UA: 0.2 mg/dL (ref 0.0–1.0)
pH: 6 (ref 5.0–8.0)

## 2012-04-05 MED ORDER — CEPHALEXIN 500 MG PO CAPS: 500.0000 mg | ORAL_CAPSULE | Freq: Three times a day (TID) | ORAL | Status: DC

## 2012-04-05 NOTE — Progress Notes (Signed)
Pt c/o continued fatigue and burning on urination. Urine specimen obtained

## 2012-04-05 NOTE — Progress Notes (Signed)
Urine specimen for patient with burning on urination and general fatigue

## 2012-04-05 NOTE — Telephone Encounter (Signed)
Patient called reporting that she did not feel well over the past week secondary to urinary burning.  She had a clean catch urinary cup at home.  She was advised to provide [place] a urine sample.   UA was performed and was negative: Results for LAWANNA, CECERE (MRN [id number]) as of 04/05/2012 16:40  Ref. Range 04/05/2012 14:18  Color, Urine Latest Range: YELLOW  YELLOW  APPearance Latest Range: CLEAR  CLEAR  Specific Gravity, Urine Latest Range: 1.005-1.030  1.020  pH Latest Range: 5.0-8.0  6.0  Glucose Latest Range: NEGATIVE mg/dL 130 (A)  Bilirubin Urine Latest Range: NEGATIVE  NEGATIVE  Ketones, ur Latest Range: NEGATIVE mg/dL TRACE (A)  Protein Latest Range: NEGATIVE mg/dL NEGATIVE  Urobilinogen, UA Latest Range: 0.0-1.0 mg/dL 0.2  Nitrite Latest Range: NEGATIVE  NEGATIVE  Leukocytes, UA Latest Range: NEGATIVE  NEGATIVE  Hgb urine dipstick Latest Range: NEGATIVE  SMALL (A)  WBC, UA Latest Range: <3 WBC/hpf 21-50  RBC / HPF Latest Range: <3 RBC/hpf 3-6  Squamous Epithelial / LPF Latest Range: RARE  RARE  Bacteria, UA Latest Range: RARE  FEW (A)     Patient reports open sores in introitus that are painful and tender.  They started about 1 week ago.  She denies any discharge or vaginal odor.  She does report urinary odor.    She denies any fevers or chills.   She thinks that if her urinary symptoms improve she will feel better.  If she feels better, she will report for chemotherapy on Monday as scheduled.  If she does not feel better, we can defer chemotherapy x 7 days and she will be worked in the clinic schedule to be seen on Monday or Tuesday.  I will prescribe Keflex 500 mg TID x 7 days.  She will ascertain OTC Monostat 7 cream and apply to affected area TID.  Patient and plan discussed with Dr. Glenford Peers and he is in agreement with the aforementioned.  KEFALAS,THOMAS

## 2012-04-08 ENCOUNTER — Encounter (HOSPITAL_BASED_OUTPATIENT_CLINIC_OR_DEPARTMENT_OTHER): Payer: Medicare Other

## 2012-04-08 ENCOUNTER — Encounter (HOSPITAL_BASED_OUTPATIENT_CLINIC_OR_DEPARTMENT_OTHER): Payer: Medicare Other | Admitting: Oncology

## 2012-04-08 ENCOUNTER — Other Ambulatory Visit (HOSPITAL_COMMUNITY): Payer: Self-pay | Admitting: Oncology

## 2012-04-08 VITALS — BP 134/95 | HR 117 | Temp 97.2°F | Resp 18 | Wt 160.6 lb

## 2012-04-08 DIAGNOSIS — C19 Malignant neoplasm of rectosigmoid junction: Secondary | ICD-10-CM

## 2012-04-08 DIAGNOSIS — R309 Painful micturition, unspecified: Secondary | ICD-10-CM

## 2012-04-08 DIAGNOSIS — E876 Hypokalemia: Secondary | ICD-10-CM

## 2012-04-08 DIAGNOSIS — C787 Secondary malignant neoplasm of liver and intrahepatic bile duct: Secondary | ICD-10-CM

## 2012-04-08 DIAGNOSIS — Z5111 Encounter for antineoplastic chemotherapy: Secondary | ICD-10-CM

## 2012-04-08 DIAGNOSIS — R110 Nausea: Secondary | ICD-10-CM

## 2012-04-08 DIAGNOSIS — R300 Dysuria: Secondary | ICD-10-CM

## 2012-04-08 DIAGNOSIS — C189 Malignant neoplasm of colon, unspecified: Secondary | ICD-10-CM

## 2012-04-08 DIAGNOSIS — Z5112 Encounter for antineoplastic immunotherapy: Secondary | ICD-10-CM

## 2012-04-08 LAB — URINALYSIS, ROUTINE W REFLEX MICROSCOPIC
Glucose, UA: 500 mg/dL — AB
Hgb urine dipstick: NEGATIVE
Leukocytes, UA: NEGATIVE
Protein, ur: 30 mg/dL — AB
pH: 6 (ref 5.0–8.0)

## 2012-04-08 LAB — COMPREHENSIVE METABOLIC PANEL
Alkaline Phosphatase: 75 U/L (ref 39–117)
BUN: 7 mg/dL (ref 6–23)
CO2: 25 mEq/L (ref 19–32)
Chloride: 96 mEq/L (ref 96–112)
Creatinine, Ser: 0.82 mg/dL (ref 0.50–1.10)
GFR calc non Af Amer: 71 mL/min — ABNORMAL LOW (ref >90)
Glucose, Bld: 299 mg/dL — ABNORMAL HIGH (ref 70–99)
Total Bilirubin: 0.6 mg/dL (ref 0.3–1.2)

## 2012-04-08 LAB — CBC WITH DIFFERENTIAL/PLATELET
Basophils Absolute: 0.1 10*3/uL (ref 0.0–0.1)
HCT: 38.6 % (ref 36.0–46.0)
Hemoglobin: 13 g/dL (ref 12.0–15.0)
Lymphocytes Relative: 15 % (ref 12–46)
Lymphs Abs: 1.7 10*3/uL (ref 0.7–4.0)
Monocytes Absolute: 0.9 10*3/uL (ref 0.1–1.0)
Neutro Abs: 8.6 10*3/uL — ABNORMAL HIGH (ref 1.7–7.7)
RBC: 4 MIL/uL (ref 3.87–5.11)
RDW: 14.8 % (ref 11.5–15.5)
WBC: 11.2 10*3/uL — ABNORMAL HIGH (ref 4.0–10.5)

## 2012-04-08 LAB — URINE MICROSCOPIC-ADD ON

## 2012-04-08 MED ORDER — SODIUM CHLORIDE 0.9 % IV SOLN
Freq: Once | INTRAVENOUS | Status: AC
  Administered 2012-04-08: 16 mg via INTRAVENOUS
  Filled 2012-04-08: qty 8

## 2012-04-08 MED ORDER — LORAZEPAM 2 MG/ML IJ SOLN
0.5000 mg | Freq: Once | INTRAMUSCULAR | Status: AC
  Administered 2012-04-08: 0.5 mg via INTRAVENOUS

## 2012-04-08 MED ORDER — POTASSIUM CHLORIDE 10 MEQ/100ML IV SOLN
10.0000 meq | INTRAVENOUS | Status: AC
  Administered 2012-04-08 (×4): 10 meq via INTRAVENOUS
  Filled 2012-04-08 (×4): qty 100

## 2012-04-08 MED ORDER — IRINOTECAN HCL CHEMO INJECTION 100 MG/5ML
180.0000 mg/m2 | Freq: Once | INTRAVENOUS | Status: AC
  Administered 2012-04-08: 348 mg via INTRAVENOUS
  Filled 2012-04-08: qty 17.4

## 2012-04-08 MED ORDER — LORAZEPAM 2 MG/ML IJ SOLN
INTRAMUSCULAR | Status: AC
  Filled 2012-04-08: qty 1

## 2012-04-08 MED ORDER — SODIUM CHLORIDE 0.9 % IV SOLN
2400.0000 mg/m2 | INTRAVENOUS | Status: DC
  Administered 2012-04-08: 4650 mg via INTRAVENOUS
  Filled 2012-04-08 (×2): qty 93

## 2012-04-08 MED ORDER — FLUOROURACIL CHEMO INJECTION 2.5 GM/50ML
400.0000 mg/m2 | Freq: Once | INTRAVENOUS | Status: AC
  Administered 2012-04-08: 750 mg via INTRAVENOUS
  Filled 2012-04-08: qty 15

## 2012-04-08 MED ORDER — LEUCOVORIN CALCIUM INJECTION 100 MG
20.0000 mg/m2 | Freq: Once | INTRAMUSCULAR | Status: AC
  Administered 2012-04-08: 38 mg via INTRAVENOUS
  Filled 2012-04-08: qty 1.9

## 2012-04-08 MED ORDER — HEPARIN SOD (PORK) LOCK FLUSH 100 UNIT/ML IV SOLN
500.0000 [IU] | Freq: Once | INTRAVENOUS | Status: DC | PRN
  Filled 2012-04-08: qty 5

## 2012-04-08 MED ORDER — SODIUM CHLORIDE 0.9 % IV SOLN
5.0000 mg/kg | Freq: Once | INTRAVENOUS | Status: AC
  Administered 2012-04-08: 400 mg via INTRAVENOUS
  Filled 2012-04-08: qty 16

## 2012-04-08 MED ORDER — SODIUM CHLORIDE 0.9 % IV SOLN
Freq: Once | INTRAVENOUS | Status: AC
  Administered 2012-04-08: 11:00:00 via INTRAVENOUS

## 2012-04-08 MED ORDER — SODIUM CHLORIDE 0.9 % IJ SOLN
10.0000 mL | INTRAMUSCULAR | Status: DC | PRN
  Administered 2012-04-08: 10 mL
  Filled 2012-04-08: qty 10

## 2012-04-08 NOTE — Progress Notes (Signed)
Ativan 0.5 mg given iv push for c/o nausea. At 1755, expressed relief from nausea.  Is resting quietly.  Tolerated reminder of chemo well.  D/C home accompanied by spouse.

## 2012-04-08 NOTE — Progress Notes (Signed)
Patient reports that urinary burning is much improved compared to Friday after starting both Keflex and Monostat cream to vaginal area.    I personally reviewed and went over laboratory results with the patient.  She admits that she has stopped her Glyburide that was precribed to her by Dr. Lysbeth Galas.  She was encouraged to restart that medication as her glucose is 299 today.  Her K= is low as well at 2.8.  We will run 40 mEq K+ IV today.  She has Kdur at home she reports but does not know the dose.  She will call us with the dose and we will direct her on how to take the medication.   KEFALAS,THOMAS

## 2012-04-09 ENCOUNTER — Other Ambulatory Visit: Payer: Self-pay | Admitting: Certified Registered Nurse Anesthetist

## 2012-04-09 LAB — URINE CULTURE: Colony Count: 70000

## 2012-04-10 ENCOUNTER — Encounter (HOSPITAL_BASED_OUTPATIENT_CLINIC_OR_DEPARTMENT_OTHER): Payer: Medicare Other

## 2012-04-10 DIAGNOSIS — C787 Secondary malignant neoplasm of liver and intrahepatic bile duct: Secondary | ICD-10-CM

## 2012-04-10 DIAGNOSIS — C19 Malignant neoplasm of rectosigmoid junction: Secondary | ICD-10-CM

## 2012-04-10 DIAGNOSIS — C189 Malignant neoplasm of colon, unspecified: Secondary | ICD-10-CM

## 2012-04-10 DIAGNOSIS — Z5189 Encounter for other specified aftercare: Secondary | ICD-10-CM

## 2012-04-10 MED ORDER — PEGFILGRASTIM INJECTION 6 MG/0.6ML
SUBCUTANEOUS | Status: AC
  Filled 2012-04-10: qty 0.6

## 2012-04-10 MED ORDER — SODIUM CHLORIDE 0.9 % IJ SOLN
10.0000 mL | INTRAMUSCULAR | Status: DC | PRN
  Administered 2012-04-10: 10 mL
  Filled 2012-04-10: qty 10

## 2012-04-10 MED ORDER — HEPARIN SOD (PORK) LOCK FLUSH 100 UNIT/ML IV SOLN
500.0000 [IU] | Freq: Once | INTRAVENOUS | Status: AC | PRN
  Administered 2012-04-10: 500 [IU]
  Filled 2012-04-10: qty 5

## 2012-04-10 MED ORDER — HEPARIN SOD (PORK) LOCK FLUSH 100 UNIT/ML IV SOLN
INTRAVENOUS | Status: AC
  Filled 2012-04-10: qty 5

## 2012-04-10 MED ORDER — PEGFILGRASTIM INJECTION 6 MG/0.6ML
6.0000 mg | Freq: Once | SUBCUTANEOUS | Status: AC
  Administered 2012-04-10: 6 mg via SUBCUTANEOUS

## 2012-04-10 NOTE — Progress Notes (Signed)
Pump d/c. Port access d/c. No s/s infection at site.  Tolerated well. Did appear to be weaker in the legs than Monday.

## 2012-04-12 ENCOUNTER — Inpatient Hospital Stay (HOSPITAL_COMMUNITY)
Admission: EM | Admit: 2012-04-12 | Discharge: 2012-04-16 | DRG: 392 | Disposition: A | Discharge status: Home / Self Care | Payer: Medicare Other | Attending: Internal Medicine | Admitting: Internal Medicine

## 2012-04-12 ENCOUNTER — Encounter (HOSPITAL_COMMUNITY): Payer: Self-pay | Admitting: *Deleted

## 2012-04-12 ENCOUNTER — Emergency Department (HOSPITAL_COMMUNITY): Payer: Medicare Other

## 2012-04-12 ENCOUNTER — Other Ambulatory Visit: Payer: Self-pay

## 2012-04-12 DIAGNOSIS — E079 Disorder of thyroid, unspecified: Secondary | ICD-10-CM | POA: Diagnosis present

## 2012-04-12 DIAGNOSIS — C189 Malignant neoplasm of colon, unspecified: Secondary | ICD-10-CM

## 2012-04-12 DIAGNOSIS — I10 Essential (primary) hypertension: Secondary | ICD-10-CM | POA: Diagnosis present

## 2012-04-12 DIAGNOSIS — Z9089 Acquired absence of other organs: Secondary | ICD-10-CM

## 2012-04-12 DIAGNOSIS — Z9049 Acquired absence of other specified parts of digestive tract: Secondary | ICD-10-CM

## 2012-04-12 DIAGNOSIS — R112 Nausea with vomiting, unspecified: Secondary | ICD-10-CM

## 2012-04-12 DIAGNOSIS — R110 Nausea: Secondary | ICD-10-CM | POA: Diagnosis present

## 2012-04-12 DIAGNOSIS — E1165 Type 2 diabetes mellitus with hyperglycemia: Secondary | ICD-10-CM

## 2012-04-12 DIAGNOSIS — R32 Unspecified urinary incontinence: Secondary | ICD-10-CM | POA: Diagnosis present

## 2012-04-12 DIAGNOSIS — IMO0001 Reserved for inherently not codable concepts without codable children: Secondary | ICD-10-CM | POA: Diagnosis present

## 2012-04-12 DIAGNOSIS — R197 Diarrhea, unspecified: Principal | ICD-10-CM | POA: Diagnosis present

## 2012-04-12 DIAGNOSIS — Z90710 Acquired absence of both cervix and uterus: Secondary | ICD-10-CM

## 2012-04-12 DIAGNOSIS — Z8505 Personal history of malignant neoplasm of liver: Secondary | ICD-10-CM

## 2012-04-12 DIAGNOSIS — D702 Other drug-induced agranulocytosis: Secondary | ICD-10-CM

## 2012-04-12 DIAGNOSIS — T451X5A Adverse effect of antineoplastic and immunosuppressive drugs, initial encounter: Secondary | ICD-10-CM | POA: Diagnosis present

## 2012-04-12 DIAGNOSIS — D72829 Elevated white blood cell count, unspecified: Secondary | ICD-10-CM | POA: Diagnosis present

## 2012-04-12 DIAGNOSIS — E119 Type 2 diabetes mellitus without complications: Secondary | ICD-10-CM | POA: Diagnosis present

## 2012-04-12 DIAGNOSIS — Z79899 Other long term (current) drug therapy: Secondary | ICD-10-CM

## 2012-04-12 DIAGNOSIS — D709 Neutropenia, unspecified: Secondary | ICD-10-CM | POA: Diagnosis present

## 2012-04-12 LAB — COMPREHENSIVE METABOLIC PANEL
Alkaline Phosphatase: 98 U/L (ref 39–117)
BUN: 23 mg/dL (ref 6–23)
Calcium: 8.3 mg/dL — ABNORMAL LOW (ref 8.4–10.5)
Creatinine, Ser: 0.71 mg/dL (ref 0.50–1.10)
GFR calc Af Amer: >90 mL/min (ref >90)
Glucose, Bld: 287 mg/dL — ABNORMAL HIGH (ref 70–99)
Total Protein: 5 g/dL — ABNORMAL LOW (ref 6.0–8.3)

## 2012-04-12 LAB — CBC WITH DIFFERENTIAL/PLATELET
Blasts: 0 %
Lymphocytes Relative: 5 % — ABNORMAL LOW (ref 12–46)
Lymphs Abs: 1 10*3/uL (ref 0.7–4.0)
Monocytes Absolute: 0 10*3/uL — ABNORMAL LOW (ref 0.1–1.0)
Monocytes Relative: 0 % — ABNORMAL LOW (ref 3–12)
Neutro Abs: 19.6 10*3/uL — ABNORMAL HIGH (ref 1.7–7.7)
Platelets: 193 10*3/uL (ref 150–400)
RBC: 3.84 MIL/uL — ABNORMAL LOW (ref 3.87–5.11)
RDW: 14.7 % (ref 11.5–15.5)
WBC: 20.6 10*3/uL — ABNORMAL HIGH (ref 4.0–10.5)
nRBC: 0 /100 WBC

## 2012-04-12 LAB — LIPASE, BLOOD: Lipase: 21 U/L (ref 11–59)

## 2012-04-12 MED ORDER — SODIUM CHLORIDE 0.9 % IV SOLN
INTRAVENOUS | Status: DC
  Administered 2012-04-12: 1000 mL via INTRAVENOUS
  Administered 2012-04-13: 04:00:00 via INTRAVENOUS

## 2012-04-12 MED ORDER — ONDANSETRON HCL 4 MG/2ML IJ SOLN
4.0000 mg | INTRAMUSCULAR | Status: DC | PRN
  Administered 2012-04-12: 4 mg via INTRAVENOUS
  Filled 2012-04-12: qty 2

## 2012-04-12 NOTE — ED Notes (Signed)
Pt reports nausea & diarrhea. Pt states she just finished chemo tx this week. diarrhea started today.

## 2012-04-12 NOTE — ED Notes (Signed)
Chemo pt for liver cancer, c/o vomiting and diarrhea and sever weakness.

## 2012-04-12 NOTE — ED Provider Notes (Signed)
History     CSN: [id number]  Arrival date & time 04/12/12  2155   First MD Initiated Contact with Patient 04/12/12 2206      Chief Complaint  Patient presents with  . Nausea  . Emesis  . Diarrhea    HPI Pt was seen at 2225.  Per pt, c/o gradual onset and persistence of multiple intermittent episodes of N/V/D that began 2 days ago. Describes the stools as "watery." States her symptoms began after her LD of chemo 2 days ago. Endorses she also had a neulasta injection 2 days ago. States she has been taking imodium and zofran without relief.   Denies abd pain, no CP/SOB, no back pain, no fevers, no black or blood in stools or emesis.    Past Medical History  Diagnosis Date  . Colon cancer     liver ca  . HTN (hypertension)   . Thyroid condition   . Diabetes mellitus   . Shingles   . Acute UTI   . Nausea 02/06/2012    Past Surgical History  Procedure Laterality Date  . Colectomy      and bil. oopherectomy  . Appendectomy    . Blt    . Resection liver partial / total w/ radiofrequency ablation    . Port-a-cath removal    . Portacath placement  09/2010  . Abdominal hysterectomy      Family History  Problem Relation Age of Onset  . Diabetes Mother   . Hypertension Mother   . Hypertension Father   . Cancer Son     History  Substance Use Topics  . Smoking status: Never Smoker   . Smokeless tobacco: Never Used  . Alcohol Use: No    Review of Systems ROS: Statement: All systems negative except as marked or noted in the HPI; Constitutional: Negative for fever and chills. ; ; Eyes: Negative for eye pain, redness and discharge. ; ; ENMT: Negative for ear pain, hoarseness, nasal congestion, sinus pressure and sore throat. ; ; Cardiovascular: Negative for chest pain, palpitations, diaphoresis, dyspnea and peripheral edema. ; ; Respiratory: Negative for cough, wheezing and stridor. ; ; Gastrointestinal: +N/V/D. Negative for abdominal pain, blood in stool, hematemesis, jaundice  and rectal bleeding. . ; ; Genitourinary: Negative for dysuria, flank pain and hematuria. ; ; Musculoskeletal: Negative for back pain and neck pain. Negative for swelling and trauma.; ; Skin: Negative for pruritus, rash, abrasions, blisters, bruising and skin lesion.; ; Neuro: Negative for headache, lightheadedness and neck stiffness. Negative for weakness, altered level of consciousness , altered mental status, extremity weakness, paresthesias, involuntary movement, seizure and syncope.      Allergies  Review of patient's allergies indicates no known allergies.  Home Medications   Current Outpatient Rx  Name  Route  Sig  Dispense  Refill  . dextrose 5 % SOLN 1,000 mL with fluorouracil 5 GM/100ML SOLN   Intravenous   Inject into the vein. Every 14 days         . glyBURIDE-metformin (GLUCOVANCE) 5-500 MG per tablet   Oral   Take 1 tablet by mouth 2 (two) times daily with a meal.         . IRINOTECAN HCL IV   Intravenous   Inject into the vein. Every 14 days         . leucovorin (WELLCOVORIN) 10 MG/ML chemo injection   Intravenous   Inject into the vein once. Every 14 days         .  levothyroxine (SYNTHROID, LEVOTHROID) 100 MCG tablet   Oral   Take 100 mcg by mouth daily.         Marland Kitchen loperamide (IMODIUM A-D) 2 MG tablet      Take 2 tab at onset of diarrhea, then 1 tab q2h until 12 hr have passed without a BM.  May take 2 tab q4h qhs. If diarrhea recurs repeat.   60 tablet   2   . LORazepam (ATIVAN) 0.5 MG tablet   Oral   Take 1 tablet (0.5 mg total) by mouth every 6 (six) hours as needed (Nausea or vomiting).   30 tablet   1   . losartan (COZAAR) 50 MG tablet   Oral   Take 50 mg by mouth daily.         . ondansetron (ZOFRAN) 8 MG tablet   Oral   Take 8 mg by mouth. Starting the day after chemo, take 1 tablet in the am and 1 tablet in the pm for 2 days. Then may take 1 tablet two times a day IF needed for nausea/vomiting.           BP 126/93  Pulse 106   Temp(Src) 97.5 F (36.4 C) (Oral)  Resp 20  Ht 5\' 5"  (1.651 m)  Wt 150 lb (68.04 kg)  BMI 24.96 kg/m2  SpO2 100%  Physical Exam 2230: Physical examination:  Nursing notes reviewed; Vital signs and O2 SAT reviewed;  Constitutional: Well developed, Well nourished, In no acute distress; Head:  Normocephalic, atraumatic; Eyes: EOMI, PERRL, No scleral icterus; ENMT: Mouth and pharynx normal, Mucous membranes dry; Neck: Supple, Full range of motion, No lymphadenopathy; Cardiovascular: Regular rate and rhythm, No murmur, rub, or gallop; Respiratory: Breath sounds clear & equal bilaterally, No rales, rhonchi, wheezes.  Speaking full sentences with ease, Normal respiratory effort/excursion; Chest: Nontender, Movement normal; Abdomen: Soft, Nontender, Nondistended, Normal bowel sounds. Rectal exam performed w/permission of pt and ED RN chaperone present. Soft/liquid brown stool in adult diaper, heme positive.;;; Genitourinary: No CVA tenderness; Extremities: Pulses normal, No tenderness, No edema, No calf edema or asymmetry.; Neuro: AA&Ox3, Major CN grossly intact.  Speech clear. No gross focal motor or sensory deficits in extremities.; Skin: Color normal, Warm, Dry.   ED Course  Procedures    MDM  MDM Reviewed: previous chart, nursing note and vitals Reviewed previous: labs and ECG Interpretation: labs, ECG and x-ray    Date: 04/13/2012  Rate: 75  Rhythm: normal sinus rhythm  QRS Axis: normal  Intervals: normal  ST/T Wave abnormalities: normal  Conduction Disutrbances:none  Narrative Interpretation:   Old EKG Reviewed: unchanged; no significant changes from previous EKG dated 07/27/2010.   Results for orders placed during the hospital encounter of 04/12/12  CBC WITH DIFFERENTIAL      Result Value Range   WBC 20.6 (*) 4.0 - 10.5 K/uL   RBC 3.84 (*) 3.87 - 5.11 MIL/uL   Hemoglobin 12.6  12.0 - 15.0 g/dL   HCT 16.1  09.6 - 04.5 %   MCV 94.3  78.0 - 100.0 fL   MCH 32.8  26.0 - 34.0 pg    MCHC 34.8  30.0 - 36.0 g/dL   RDW 40.9  81.1 - 91.4 %   Platelets 193  150 - 400 K/uL   Neutrophils Relative 95 (*) 43 - 77 %   Lymphocytes Relative 5 (*) 12 - 46 %   Monocytes Relative 0 (*) 3 - 12 %   Eosinophils Relative 0  0 -  5 %   Basophils Relative 0  0 - 1 %   Band Neutrophils 0  0 - 10 %   Metamyelocytes Relative 0     Myelocytes 0     Promyelocytes Absolute 0     Blasts 0     nRBC 0  0 /100 WBC   Neutro Abs 19.6 (*) 1.7 - 7.7 K/uL   Lymphs Abs 1.0  0.7 - 4.0 K/uL   Monocytes Absolute 0.0 (*) 0.1 - 1.0 K/uL   Eosinophils Absolute 0.0  0.0 - 0.7 K/uL   Basophils Absolute 0.0  0.0 - 0.1 K/uL  COMPREHENSIVE METABOLIC PANEL      Result Value Range   Sodium 131 (*) 135 - 145 mEq/L   Potassium 4.4  3.5 - 5.1 mEq/L   Chloride 98  96 - 112 mEq/L   CO2 17 (*) 19 - 32 mEq/L   Glucose, Bld 287 (*) 70 - 99 mg/dL   BUN 23  6 - 23 mg/dL   Creatinine, Ser 1.61  0.50 - 1.10 mg/dL   Calcium 8.3 (*) 8.4 - 10.5 mg/dL   Total Protein 5.0 (*) 6.0 - 8.3 g/dL   Albumin 2.7 (*) 3.5 - 5.2 g/dL   AST 18  0 - 37 U/L   ALT 22  0 - 35 U/L   Alkaline Phosphatase 98  39 - 117 U/L   Total Bilirubin 0.7  0.3 - 1.2 mg/dL   GFR calc non Af Amer 85 (*) >90 mL/min   GFR calc Af Amer >90  >90 mL/min  LIPASE, BLOOD      Result Value Range   Lipase 21  11 - 59 U/L  LACTIC ACID, PLASMA      Result Value Range   Lactic Acid, Venous 1.8  0.5 - 2.2 mmol/L  TROPONIN I      Result Value Range   Troponin I <0.30  <0.30 ng/mL    Dg Abd Acute W/chest 04/13/2012  *RADIOLOGY REPORT*  Clinical Data: Metastatic colon carcinoma.  Nausea vomiting. Diarrhea.  The  ACUTE ABDOMEN SERIES (ABDOMEN 2 VIEW & CHEST 1 VIEW)  Comparison: None.  Findings: The bowel gas pattern is normal.  No evidence of free air.  No radiopaque calculi identified.  Heart size is within normal limits.  Mild bibasilar scarring seen. Lungs otherwise clear.  Left-sided power port is seen in appropriate position.  IMPRESSION: Normal bowel gas  pattern.  No acute findings.   Original Report Authenticated By: Myles Rosenthal, M.D.      0030:  Pt has had multiple diarrheal stools while in the ED; cdiff and stool culture pending. VSS, not orthostatic.  WBC elevated, but afebrile and had neulasta injection 2 days ago.  Dx and testing d/w pt and family.  Questions answered.  Verb understanding, agreeable to admit.  T/C to [facility]. Orvan Falconer, case discussed, including:  HPI, pertinent PM/SHx, VS/PE, dx testing, ED course and treatment:  Agreeable to admit, requests to write temporary orders, obtain tele bed.         Laray Anger, DO 04/14/12 1359

## 2012-04-12 NOTE — ED Notes (Signed)
Pt was slightly dizzy from lying to setting.Pt went from setting to standing very dizzy unable to get standing bp.Nadra Hritz

## 2012-04-13 ENCOUNTER — Encounter (HOSPITAL_COMMUNITY): Payer: Self-pay | Admitting: *Deleted

## 2012-04-13 DIAGNOSIS — E079 Disorder of thyroid, unspecified: Secondary | ICD-10-CM | POA: Diagnosis present

## 2012-04-13 DIAGNOSIS — I10 Essential (primary) hypertension: Secondary | ICD-10-CM

## 2012-04-13 DIAGNOSIS — E119 Type 2 diabetes mellitus without complications: Secondary | ICD-10-CM

## 2012-04-13 DIAGNOSIS — IMO0001 Reserved for inherently not codable concepts without codable children: Secondary | ICD-10-CM

## 2012-04-13 DIAGNOSIS — C189 Malignant neoplasm of colon, unspecified: Secondary | ICD-10-CM

## 2012-04-13 DIAGNOSIS — R197 Diarrhea, unspecified: Principal | ICD-10-CM

## 2012-04-13 DIAGNOSIS — R112 Nausea with vomiting, unspecified: Secondary | ICD-10-CM

## 2012-04-13 HISTORY — DX: Type 2 diabetes mellitus without complications: E11.9

## 2012-04-13 LAB — URINALYSIS, ROUTINE W REFLEX MICROSCOPIC
Nitrite: NEGATIVE
Specific Gravity, Urine: >1.03 — ABNORMAL HIGH (ref 1.005–1.030)
Urobilinogen, UA: 0.2 mg/dL (ref 0.0–1.0)

## 2012-04-13 LAB — HEMOGLOBIN A1C
Hgb A1c MFr Bld: 10.7 % — ABNORMAL HIGH (ref <5.7)
Mean Plasma Glucose: 260 mg/dL — ABNORMAL HIGH (ref <117)

## 2012-04-13 LAB — GLUCOSE, CAPILLARY
Glucose-Capillary: 195 mg/dL — ABNORMAL HIGH (ref 70–99)
Glucose-Capillary: 111 mg/dL — ABNORMAL HIGH (ref 70–99)

## 2012-04-13 LAB — MAGNESIUM: Magnesium: 2.3 mg/dL (ref 1.5–2.5)

## 2012-04-13 LAB — CLOSTRIDIUM DIFFICILE BY PCR: Toxigenic C. Difficile by PCR: NEGATIVE

## 2012-04-13 LAB — TSH: TSH: 5.472 u[IU]/mL — ABNORMAL HIGH (ref 0.350–4.500)

## 2012-04-13 MED ORDER — LEVOTHYROXINE SODIUM 100 MCG PO TABS: 100.0000 ug | ORAL_TABLET | Freq: Every day | ORAL | Status: DC

## 2012-04-13 MED ORDER — ONDANSETRON HCL 4 MG/2ML IJ SOLN
4.0000 mg | INTRAMUSCULAR | Status: DC | PRN
  Administered 2012-04-13 – 2012-04-15 (×4): 4 mg via INTRAVENOUS
  Filled 2012-04-13 (×4): qty 2

## 2012-04-13 MED ORDER — INSULIN ASPART 100 UNIT/ML ~~LOC~~ SOLN: 0.0000 [IU] | Freq: Every day | SUBCUTANEOUS | Status: DC

## 2012-04-13 MED ORDER — BOOST / RESOURCE BREEZE PO LIQD
1.0000 | Freq: Three times a day (TID) | ORAL | Status: DC
  Administered 2012-04-13 – 2012-04-15 (×6): 1 via ORAL

## 2012-04-13 MED ORDER — OCTREOTIDE ACETATE 100 MCG/ML IJ SOLN
INTRAMUSCULAR | Status: AC
  Filled 2012-04-13: qty 1

## 2012-04-13 MED ORDER — ACETAMINOPHEN 325 MG PO TABS: 650.0000 mg | ORAL_TABLET | Freq: Four times a day (QID) | ORAL | Status: DC | PRN

## 2012-04-13 MED ORDER — LORAZEPAM 0.5 MG PO TABS: 0.5000 mg | ORAL_TABLET | Freq: Four times a day (QID) | ORAL | Status: DC | PRN

## 2012-04-13 MED ORDER — FLORANEX PO PACK
1.0000 g | PACK | Freq: Three times a day (TID) | ORAL | Status: DC
  Administered 2012-04-14 – 2012-04-16 (×6): 1 g via ORAL
  Filled 2012-04-13 (×11): qty 1

## 2012-04-13 MED ORDER — HYDROMORPHONE HCL PF 1 MG/ML IJ SOLN: 0.5000 mg | INTRAMUSCULAR | Status: DC | PRN

## 2012-04-13 MED ORDER — HYOSCYAMINE SULFATE 0.125 MG SL SUBL: 0.1250 mg | SUBLINGUAL_TABLET | SUBLINGUAL | Status: DC | PRN

## 2012-04-13 MED ORDER — INSULIN ASPART 100 UNIT/ML ~~LOC~~ SOLN
0.0000 [IU] | Freq: Three times a day (TID) | SUBCUTANEOUS | Status: DC
  Administered 2012-04-13: 2 [IU] via SUBCUTANEOUS
  Administered 2012-04-13: 5 [IU] via SUBCUTANEOUS
  Administered 2012-04-13: 7 [IU] via SUBCUTANEOUS
  Administered 2012-04-14 (×2): 2 [IU] via SUBCUTANEOUS
  Administered 2012-04-14: 3 [IU] via SUBCUTANEOUS
  Administered 2012-04-15: 2 [IU] via SUBCUTANEOUS
  Administered 2012-04-15: 1 [IU] via SUBCUTANEOUS
  Administered 2012-04-15 – 2012-04-16 (×2): 2 [IU] via SUBCUTANEOUS

## 2012-04-13 MED ORDER — OCTREOTIDE ACETATE 100 MCG/ML IJ SOLN
100.0000 ug | Freq: Three times a day (TID) | INTRAMUSCULAR | Status: DC
  Administered 2012-04-13 – 2012-04-15 (×6): 100 ug via INTRAVENOUS
  Filled 2012-04-13 (×21): qty 1

## 2012-04-13 MED ORDER — POTASSIUM CHLORIDE IN NACL 20-0.9 MEQ/L-% IV SOLN
INTRAVENOUS | Status: DC
  Administered 2012-04-13: 05:00:00 via INTRAVENOUS
  Administered 2012-04-14: 100 mL/h via INTRAVENOUS
  Administered 2012-04-14 – 2012-04-15 (×3): via INTRAVENOUS

## 2012-04-13 MED ORDER — LOPERAMIDE HCL 2 MG PO CAPS
2.0000 mg | ORAL_CAPSULE | ORAL | Status: DC | PRN
  Administered 2012-04-13 (×2): 2 mg via ORAL
  Filled 2012-04-13 (×2): qty 1

## 2012-04-13 MED ORDER — ONDANSETRON HCL 4 MG PO TABS
8.0000 mg | ORAL_TABLET | Freq: Two times a day (BID) | ORAL | Status: DC | PRN
Start: 2012-04-13 — End: 2012-04-13

## 2012-04-13 MED ORDER — SODIUM CHLORIDE 0.9 % IJ SOLN
3.0000 mL | Freq: Two times a day (BID) | INTRAMUSCULAR | Status: DC
  Administered 2012-04-13 – 2012-04-15 (×3): 3 mL via INTRAVENOUS

## 2012-04-13 MED ORDER — LOPERAMIDE HCL 2 MG PO CAPS
2.0000 mg | ORAL_CAPSULE | ORAL | Status: DC | PRN
  Filled 2012-04-13: qty 1

## 2012-04-13 MED ORDER — LOPERAMIDE HCL 2 MG PO CAPS: 2.0000 mg | ORAL_CAPSULE | ORAL | Status: DC | PRN

## 2012-04-13 MED ORDER — CLOTRIMAZOLE 1 % EX CREA
TOPICAL_CREAM | Freq: Three times a day (TID) | CUTANEOUS | Status: DC
  Administered 2012-04-14: 1 via TOPICAL
  Administered 2012-04-14 – 2012-04-15 (×5): via TOPICAL
  Filled 2012-04-13: qty 15

## 2012-04-13 MED ORDER — LOSARTAN POTASSIUM 50 MG PO TABS: 50.0000 mg | ORAL_TABLET | Freq: Every day | ORAL | Status: DC

## 2012-04-13 MED ORDER — LOPERAMIDE HCL 2 MG PO CAPS
4.0000 mg | ORAL_CAPSULE | ORAL | Status: DC
  Administered 2012-04-13 – 2012-04-16 (×15): 4 mg via ORAL
  Filled 2012-04-13 (×3): qty 2
  Filled 2012-04-13: qty 1
  Filled 2012-04-13 (×3): qty 2
  Filled 2012-04-13 (×2): qty 1
  Filled 2012-04-13 (×7): qty 2

## 2012-04-13 MED ORDER — LORAZEPAM 0.5 MG PO TABS
0.5000 mg | ORAL_TABLET | Freq: Four times a day (QID) | ORAL | Status: DC | PRN
  Administered 2012-04-13: 0.5 mg via ORAL
  Filled 2012-04-13: qty 1

## 2012-04-13 MED ORDER — LOPERAMIDE HCL 2 MG PO CAPS
4.0000 mg | ORAL_CAPSULE | Freq: Once | ORAL | Status: AC
  Administered 2012-04-13: 4 mg via ORAL
  Filled 2012-04-13: qty 2

## 2012-04-13 MED ORDER — MORPHINE SULFATE (CONCENTRATE) 10 MG /0.5 ML PO SOLN
10.0000 mg | ORAL | Status: DC
  Administered 2012-04-13 – 2012-04-16 (×15): 10 mg via ORAL
  Filled 2012-04-13 (×15): qty 0.5

## 2012-04-13 MED ORDER — BARRIER CREAM NON-SPECIFIED
1.0000 "application " | TOPICAL_CREAM | TOPICAL | Status: DC | PRN
  Filled 2012-04-13: qty 1

## 2012-04-13 MED ORDER — OXYCODONE HCL 5 MG PO TABS: 5.0000 mg | ORAL_TABLET | ORAL | Status: DC | PRN

## 2012-04-13 MED ORDER — HYDROCORTISONE 2.5 % RE CREA
TOPICAL_CREAM | RECTAL | Status: AC
  Filled 2012-04-13: qty 28.35

## 2012-04-13 MED ORDER — PRAMOXINE-ZINC OXIDE IN MO 1-12.5 % RE OINT
TOPICAL_OINTMENT | RECTAL | Status: AC
  Filled 2012-04-13: qty 28.3

## 2012-04-13 MED ORDER — HYDROCORTISONE 2.5 % RE CREA
TOPICAL_CREAM | Freq: Four times a day (QID) | RECTAL | Status: DC
  Administered 2012-04-14 (×2): 1 via RECTAL
  Administered 2012-04-14 – 2012-04-15 (×5): via RECTAL
  Filled 2012-04-13: qty 28.35

## 2012-04-13 MED ORDER — SODIUM CHLORIDE 0.9 % IV SOLN: INTRAVENOUS | Status: DC

## 2012-04-13 MED ORDER — ENOXAPARIN SODIUM 40 MG/0.4ML ~~LOC~~ SOLN
40.0000 mg | SUBCUTANEOUS | Status: DC
  Administered 2012-04-13 – 2012-04-15 (×3): 40 mg via SUBCUTANEOUS
  Filled 2012-04-13 (×3): qty 0.4

## 2012-04-13 MED ORDER — GI COCKTAIL ~~LOC~~
30.0000 mL | Freq: Once | ORAL | Status: AC
  Administered 2012-04-13: 30 mL via ORAL
  Filled 2012-04-13: qty 30

## 2012-04-13 MED ORDER — ONDANSETRON HCL 4 MG/2ML IJ SOLN: 4.0000 mg | Freq: Three times a day (TID) | INTRAMUSCULAR | Status: DC | PRN

## 2012-04-13 MED ORDER — LOPERAMIDE HCL 2 MG PO TABS: 2.0000 mg | ORAL_TABLET | ORAL | Status: DC | PRN

## 2012-04-13 MED ORDER — CLOTRIMAZOLE 1 % EX CREA
TOPICAL_CREAM | CUTANEOUS | Status: AC
  Filled 2012-04-13: qty 15

## 2012-04-13 NOTE — Progress Notes (Signed)
Patient admitted after midnight. Nausea is slightly improved, but not eating much. Has required rectal tube. Very weak.  Crista Curb, M.D.

## 2012-04-13 NOTE — H&P (Signed)
[facility] History and Physical  KAMIE KORBER  JXB:[id number]  DOB: Jan 26, 1940   DOA: 04/13/2012   PCP:   [name], MD   Chief Complaint:  Diarrhea x 2 days  HPI: [name] is an 72 y.o. female.  Pleasant elderly Caucasian lady, medical problems as listed below, receiving chemo Tx for Ca Colon (see med list). Last cycle 2 days ago and, as was predicted , has been having steady diarrhea. So numerous sheis unable to count. Has been using imodium at home without effect.Diarhea this time is much worse than last time. No blood, no fever. Eventually came to Ed ans has spent virtually the entire ED course sitting on the commode.  Patient is curently up in bed, on the ward, and has been immediately incontinent on the sheets; she is very weak and  Rewiew of Systems:  Denies any other problems on complete review Denies nausea or vomiting at present  All systems negative except as marked bold or noted in the HPI;  Constitutional:    malaise, fever and chills. ;  Eyes:   eye pain, redness and discharge. ;  ENMT:   ear pain, hoarseness, nasal congestion, sinus pressure and sore throat. ;  Cardiovascular:    chest pain, palpitations, diaphoresis, dyspnea and peripheral edema.  Respiratory:   cough, hemoptysis, wheezing and stridor. ;  Gastrointestinal:   constipation, abdominal pain, melena, blood in stool, hematemesis, jaundice and rectal bleeding. unusual weight loss..   Genitourinary:    frequency, dysuria, incontinence,flank pain and hematuria; Musculoskeletal:   back pain and neck pain.  swelling and trauma.;  Skin: .  pruritus, rash, abrasions, bruising and skin lesion.; ulcerations Neuro:    headache, lightheadedness and neck stiffness.  weakness, altered level of consciousness, altered mental status, extremity weakness, burning feet, involuntary movement, seizure and syncope.  Psych:    anxiety, depression, insomnia, tearfulness, panic attacks, hallucinations,  paranoia, suicidal or homicidal ideation   Past Medical History  Diagnosis Date  . Colon cancer     liver ca  . HTN (hypertension)   . Thyroid condition   . Diabetes mellitus   . Shingles   . Acute UTI   . Nausea 02/06/2012    Past Surgical History  Procedure Laterality Date  . Colectomy      and bil. oopherectomy  . Appendectomy    . Blt    . Resection liver partial / total w/ radiofrequency ablation    . Port-a-cath removal    . Portacath placement  09/2010  . Abdominal hysterectomy      Medications:  HOME MEDS: Prior to Admission medications   Medication Sig Start Date End Date Taking? Authorizing Provider  dextrose 5 % SOLN 1,000 mL with fluorouracil 5 GM/100ML SOLN Inject into the vein. Every 14 days   Yes Historical Provider, MD  glyBURIDE-metformin (GLUCOVANCE) 5-500 MG per tablet Take 1 tablet by mouth 2 (two) times daily with a meal.   Yes Historical Provider, MD  IRINOTECAN HCL IV Inject into the vein. Every 14 days   Yes Historical Provider, MD  leucovorin (WELLCOVORIN) 10 MG/ML chemo injection Inject into the vein once. Every 14 days   Yes Historical Provider, MD  levothyroxine (SYNTHROID, LEVOTHROID) 100 MCG tablet Take 100 mcg by mouth daily.   Yes Historical Provider, MD  loperamide (IMODIUM A-D) 2 MG tablet Take 2 tab at onset of diarrhea, then 1 tab q2h until 12 hr have passed without a BM.  May take 2 tab  q4h qhs. If diarrhea recurs repeat. 01/01/12  Yes Randall An, MD  LORazepam (ATIVAN) 0.5 MG tablet Take 1 tablet (0.5 mg total) by mouth every 6 (six) hours as needed (Nausea or vomiting). 02/20/12 08/18/12 Yes Maurine Minister Kefalas, PA-C  losartan (COZAAR) 50 MG tablet Take 50 mg by mouth daily.   Yes Historical Provider, MD  ondansetron (ZOFRAN) 8 MG tablet Take 8 mg by mouth. Starting the day after chemo, take 1 tablet in the am and 1 tablet in the pm for 2 days. Then may take 1 tablet two times a day IF needed for nausea/vomiting.   Yes Historical Provider,  MD     Allergies:  No Known Allergies  Social History:   reports that she has never smoked. She has never used smokeless tobacco. She reports that she does not drink alcohol. Her drug history is not on file.  Family History: Family History  Problem Relation Age of Onset  . Diabetes Mother   . Hypertension Mother   . Hypertension Father   . Cancer Son      Physical Exam: Filed Vitals:   04/12/12 2356 04/12/12 2357 04/13/12 0203 04/13/12 0305  BP: 122/83 126/93 130/77 137/86  Pulse: 88 106 85 84  Temp:    97.6 F (36.4 C)  TempSrc:    Oral  Resp:   18 18  Height:    5\' 5"  (1.651 m)  Weight:    71.8 kg (158 lb 4.6 oz)  SpO2:   96% 98%   Blood pressure 137/86, pulse 84, temperature 97.6 F (36.4 C), temperature source Oral, resp. rate 18, height 5\' 5"  (1.651 m), weight 71.8 kg (158 lb 4.6 oz), SpO2 98.00%.  GEN:  Pleasant overweight elderly  lying bed in distressed by ongoing diarrhea; cooperative with exam PSYCH:  alert and oriented x4;  affect is appropriate. HEENT: Mucous membranes pink, dry, and anicteric; PERRLA; EOM intact; no cervical lymphadenopathy nor thyromegaly or carotid bruit; no JVD; Breasts:: Not examined CHEST WALL: No tenderness CHEST: Normal respiration, clear to auscultation bilaterally HEART: Regular rate and rhythm; no murmurs rubs or gallops BACK:mild kyphosis no scoliosis; no CVA tenderness ABDOMEN: Obese, soft mild-tenderness; no masses, no organomegaly, increased abdominal bowel sounds;  Rectal Exam: Not done EXTREMITIES: age-appropriate arthropathy of the hands and knees; no edema; no ulcerations. Genitalia: not examined PULSES: 2+ and symmetric SKIN: Normal hydration no rash or ulceration CNS: Cranial nerves 2-12 grossly intact no focal lateralizing neurologic deficit   Labs on Admission:  Basic Metabolic Panel:  Recent Labs Lab 04/08/12 0915 04/12/12 2259  NA 136 131*  K 2.8* 4.4  CL 96 98  CO2 25 17*  GLUCOSE 299* 287*  BUN 7  23  CREATININE 0.82 0.71  CALCIUM 8.9 8.3*   Liver Function Tests:  Recent Labs Lab 04/08/12 0915 04/12/12 2259  AST 20 18  ALT 23 22  ALKPHOS 75 98  BILITOT 0.6 0.7  PROT 6.1 5.0*  ALBUMIN 3.1* 2.7*    Recent Labs Lab 04/12/12 2259  LIPASE 21   No results found for this basename: AMMONIA,  in the last 168 hours CBC:  Recent Labs Lab 04/08/12 0915 04/12/12 2259  WBC 11.2* 20.6*  NEUTROABS 8.6* 19.6*  HGB 13.0 12.6  HCT 38.6 36.2  MCV 96.5 94.3  PLT 300 193   Cardiac Enzymes:  Recent Labs Lab 04/12/12 2259  TROPONINI <0.30   BNP: No components found with this basename: POCBNP,  D-dimer: No components found with  this basename: D-DIMER,  CBG: No results found for this basename: GLUCAP,  in the last 168 hours  Radiological Exams on Admission: Dg Abd Acute W/chest  04/13/2012  *RADIOLOGY REPORT*  Clinical Data: Metastatic colon carcinoma.  Nausea vomiting. Diarrhea.  The  ACUTE ABDOMEN SERIES (ABDOMEN 2 VIEW & CHEST 1 VIEW)  Comparison: None.  Findings: The bowel gas pattern is normal.  No evidence of free air.  No radiopaque calculi identified.  Heart size is within normal limits.  Mild bibasilar scarring seen. Lungs otherwise clear.  Left-sided power port is seen in appropriate position.  IMPRESSION: Normal bowel gas pattern.  No acute findings.   Original Report Authenticated By: Myles Rosenthal, M.D.      Assessment/Plan Present on Admission:  . Intractable diarrhea  Place flexi seal for comfort; regular imodium afte checking c-dif; adequate hydration; narcotics for comfort; prn zofran; clear liquid diet to rest bowel and make it easier for intestinal villii to recover.  . Diabetes type 2, uncontrolled  Hold oral meds and give SSI to asses insulin needs  . Thyroid condition  Hold synthroid to encourage constipation; check TSH  . Colon adenocarcinoma  Onc consult  . HTN (hypertension), benign  Hold ACE/ARB while potential for dehydration and  hypotension from copius diarrhea.    Other plans as per orders  Code Status: FULL CODE  Family Communication: Discussed with Pt at bedside Disposition Plan: home when stable    Wynette Jersey Nocturnist [facility] Pager [phone]  04/13/2012, 4:39 AM

## 2012-04-13 NOTE — Plan of Care (Signed)
Problem: Phase I Progression Outcomes Goal: Voiding-avoid urinary catheter unless indicated Outcome: Not Progressing At this time patient requires her foley due to a angry vaginal and between buttocks. It is beefy red and painful.

## 2012-04-13 NOTE — Progress Notes (Signed)
INITIAL NUTRITION ASSESSMENT  DOCUMENTATION CODES Per approved criteria  -Severe malnutrition in the context of chronic illness   INTERVENTION: Resource Breeze po TID, each supplement provides 250 kcal and 9 grams of protein.  NUTRITION DIAGNOSIS: Inadequate oral food intake related to decreased appetite, limited food acceptance as evidenced by 12% wt loss x 3 months.   Goal: 1) Pt will maintain current weight of 158# 2) Pt will meet >75% of estimated energy needs  Monitor:  Diet advancement, PO intake, weight changes, labs, skin integrity, changes in status  Reason for Assessment: MST=2  72 y.o. female  Admitting Dx: Intractable diarrhea  ASSESSMENT: Pt is a very pleasant lady who just finished chemo last week. She reports decreased appetite for the past 3 months. She complains of limited food acceptance, early satiety, and "food just not tasting right". She reports her diet mainly consists of buttermilk and pineapple, as that is "all I have the taste for". She has tried supplements such as Ensure and Boost in the past, but does not like the taste or smell of them. She has not tried a clear liquid supplement previously, but is willing to try. She reports she is doing "fair" with her clear liquid diet. No PO intake data available at this time.  Weight hx reveals a 22# (12%) wt loss x 3 months, which is clinically significant. Pt husband reports that side effects of chemo have affected her wt status and appetite greatly, noting that she would lose as much as 8# between treatments.  Educated pt and husband on importance of PO intake to support healing process. Educated on high calorie, high protein diet and alternatives for supplements. Discussed a liberalized diet containing pt's favorite foods to maximize PO intake. Also discussed eating small, frequent meals throughout the day to help maximize intake due to early satiety.  Pt meets criteria for severe MALNUTRITION in the context of  chronic illness as evidenced by consuming <75% of estimated energy needs x 1 month, 12% wt loss x 3 months.  Height: Ht Readings from Last 1 Encounters:  04/13/12 5\' 5"  (1.651 m)    Weight: Wt Readings from Last 1 Encounters:  04/13/12 158 lb 4.6 oz (71.8 kg)    Ideal Body Weight: 125#  % Ideal Body Weight: 126%  Wt Readings from Last 10 Encounters:  04/13/12 158 lb 4.6 oz (71.8 kg)  04/08/12 160 lb 9.6 oz (72.848 kg)  03/13/12 175 lb 12.8 oz (79.742 kg)  02/28/12 173 lb 9.6 oz (78.744 kg)  02/20/12 176 lb 6.4 oz (80.015 kg)  02/06/12 178 lb (80.74 kg)  02/06/12 178 lb (80.74 kg)  01/30/12 179 lb 9.6 oz (81.466 kg)  01/16/12 180 lb 6.4 oz (81.829 kg)  12/06/11 178 lb 8 oz (80.967 kg)    Usual Body Weight: 170#  % Usual Body Weight: 93%  BMI:  Body mass index is 26.34 kg/(m^2). Classified as overweight.   Estimated Nutritional Needs: Kcal: 2155-2514 daily Protein: 86-108 grams daily Fluid: 1 ml/kcal  Skin: Dry, but intact  Diet Order: Clear Liquid  EDUCATION NEEDS: -Education needs addressed   Intake/Output Summary (Last 24 hours) at 04/13/12 1150 Last data filed at 04/13/12 0800  Gross per 24 hour  Intake    120 ml  Output      0 ml  Net    120 ml    Last BM: 04/13/12   Labs:   Recent Labs Lab 04/08/12 0915 04/12/12 2259 04/13/12 0800  NA 136 131*  --  K 2.8* 4.4  --   CL 96 98  --   CO2 25 17*  --   BUN 7 23  --   CREATININE 0.82 0.71  --   CALCIUM 8.9 8.3*  --   MG  --   --  2.3  GLUCOSE 299* 287*  --     CBG (last 3)   Recent Labs  04/13/12 0900 04/13/12 1104  GLUCAP 262* 336*    Scheduled Meds: . enoxaparin (LOVENOX) injection  40 mg Subcutaneous Q24H  . insulin aspart  0-5 Units Subcutaneous QHS  . insulin aspart  0-9 Units Subcutaneous TID WC  . sodium chloride  3 mL Intravenous Q12H    Continuous Infusions: . 0.9 % NaCl with KCl 20 mEq / L 100 mL/hr at 04/13/12 1610    Past Medical History  Diagnosis Date  .  Colon cancer     liver ca  . HTN (hypertension)   . Thyroid condition   . Diabetes mellitus   . Shingles   . Acute UTI   . Nausea 02/06/2012    Past Surgical History  Procedure Laterality Date  . Colectomy      and bil. oopherectomy  . Appendectomy    . Blt    . Resection liver partial / total w/ radiofrequency ablation    . Port-a-cath removal    . Portacath placement  09/2010  . Abdominal hysterectomy      Melody Haver, RD, LDN Pager: [phone]

## 2012-04-13 NOTE — Progress Notes (Addendum)
[facility] PM Coverage Progress Note  Called RE: patient request for discontinuation of Flexiseal rectal tube-reports discomfort of tube worse than dirrhea. Patient in with chemotherapy induced diarrhea (CID), had FU and irinotecan on 04/08/12- per nurse constant loose stools, cramping, nausea, poor PO intake.  Grade 3 CID- aggressively manage from palliative standpoint and consider future prophylaxis with additional chemotherapy.  The following orders were placed:   1. Start Octreotide 100mg  TID IV (long acting IM depot used in palliative medicine for CID prophylaxis), low risk of toxicity in this dose, monitor CBG for hypoglycemia, may escalate dose by doubling q24hours if diarrhea continues.  2. Schedule Loperamide 4mg  q4 and q1 2mg  prn for continued loose stools.  3. Tincture of opium/Morphine 10mg  liquid for GI absorbtion q4 hours.  4. Levsin SL 0.125mg  prn for abdominal cramping/monitior for anticholinergic se  5. Probiotic when taking PO- Lactobacillus pref.  6. Removed flexiseal.  7. Barrier cream regimen to include clotrimazole and hydrocortisone for severe irritation.  8. Will also give one dose GI cocktail (for binding).  9. When diet resumes rec avoiding Lactose and sorbitol/sugar alcohols.  Anderson Malta, DO Internal Medicine

## 2012-04-14 LAB — GLUCOSE, CAPILLARY: Glucose-Capillary: 140 mg/dL — ABNORMAL HIGH (ref 70–99)

## 2012-04-14 LAB — URINE CULTURE

## 2012-04-14 NOTE — Progress Notes (Signed)
[facility] PROGRESS NOTE  [name] ZOX:[id number] DOB: November 16, 1940 DOA: 04/12/2012 PCP: [name], MD  Assessment/Plan: Principal Problem:   Intractable diarrhea: Looks to be secondary to recent chemotherapy treatment. Improved. Slowly advance diet. Active Problems:   Colon adenocarcinoma: Patient had PET scan scheduled for tomorrow. We'll call and try to reschedule soon as possible after discharge   Thyroid condition: Stable   Diabetes type 2, uncontrolled: Sliding-scale only   HTN (hypertension), benign: Blood pressure stable   Code Status: Full code Family Communication: Plan discussed with husband at the bedside Disposition Plan: Home possibly tomorrow   Consultants:  None  Procedures:  None  Antibiotics:  None  HPI/Subjective: Patient feeling much better. Has not had any diarrhea now for almost 12 hours. Tolerating some liquids-sips  Objective: Filed Vitals:   04/13/12 2146 04/14/12 0538 04/14/12 1039 04/14/12 1454  BP: 127/80 118/58 113/71 117/76  Pulse: 66 70 78 74  Temp: 97.4 F (36.3 C) 97.8 F (36.6 C) 97.8 F (36.6 C) 98.1 F (36.7 C)  TempSrc: Oral Oral    Resp: 20 20 20 20   Height:      Weight:  75.07 kg (165 lb 8 oz)    SpO2: 97% 98% 96% 94%    Intake/Output Summary (Last 24 hours) at 04/14/12 1545 Last data filed at 04/14/12 0900  Gross per 24 hour  Intake    240 ml  Output   2250 ml  Net  -2010 ml   Filed Weights   04/12/12 2159 04/13/12 0305 04/14/12 0538  Weight: 68.04 kg (150 lb) 71.8 kg (158 lb 4.6 oz) 75.07 kg (165 lb 8 oz)    Exam:   General:  Alert and oriented x3, no acute distress  Cardiovascular: Regular rate and rhythm, S1-S2  Respiratory: Clear to auscultation bilaterally  Abdomen: Soft, nontender, nondistended, positive bowel sounds  Musculoskeletal: No clubbing or cyanosis or edema   Data Reviewed: Basic Metabolic Panel:  Recent Labs Lab 04/08/12 0915 04/12/12 2259 04/13/12 0800   NA 136 131*  --   K 2.8* 4.4  --   CL 96 98  --   CO2 25 17*  --   GLUCOSE 299* 287*  --   BUN 7 23  --   CREATININE 0.82 0.71  --   CALCIUM 8.9 8.3*  --   MG  --   --  2.3   Liver Function Tests:  Recent Labs Lab 04/08/12 0915 04/12/12 2259  AST 20 18  ALT 23 22  ALKPHOS 75 98  BILITOT 0.6 0.7  PROT 6.1 5.0*  ALBUMIN 3.1* 2.7*    Recent Labs Lab 04/12/12 2259  LIPASE 21   CBC:  Recent Labs Lab 04/08/12 0915 04/12/12 2259  WBC 11.2* 20.6*  NEUTROABS 8.6* 19.6*  HGB 13.0 12.6  HCT 38.6 36.2  MCV 96.5 94.3  PLT 300 193   Cardiac Enzymes:  Recent Labs Lab 04/12/12 2259  TROPONINI <0.30   BNP (last 3 results) No results found for this basename: PROBNP,  in the last 8760 hours CBG:  Recent Labs Lab 04/13/12 1104 04/13/12 1615 04/13/12 2151 04/14/12 0758 04/14/12 1121  GLUCAP 336* 195* 111* 212* 253*    Recent Results (from the past 240 hour(s))  URINE CULTURE     Status: None   Collection Time    04/05/12  2:18 PM      Result Value Range Status   Specimen Description URINE, CLEAN CATCH   Final   Special Requests  NONE   Final   Culture  Setup Time 04/06/2012 06:20   Final   Colony Count 70,000 COLONIES/ML   Final   Culture     Final   Value: STAPHYLOCOCCUS AUREUS     Note: RIFAMPIN AND GENTAMICIN SHOULD NOT BE USED AS SINGLE DRUGS FOR TREATMENT OF STAPH INFECTIONS.   Report Status 04/09/2012 FINAL   Final   Organism ID, Bacteria STAPHYLOCOCCUS AUREUS   Final  CLOSTRIDIUM DIFFICILE BY PCR     Status: None   Collection Time    04/12/12 11:56 PM      Result Value Range Status   C difficile by pcr NEGATIVE  NEGATIVE Final  URINE CULTURE     Status: None   Collection Time    04/13/12 12:25 AM      Result Value Range Status   Specimen Description URINE, CATHETERIZED   Final   Special Requests NONE   Final   Culture  Setup Time 04/13/2012 20:52   Final   Colony Count NO GROWTH   Final   Culture NO GROWTH   Final   Report Status  04/14/2012 FINAL   Final     Studies: Dg Abd Acute W/chest  04/13/2012   IMPRESSION: Normal bowel gas pattern.  No acute findings.   Original Report Authenticated By: Myles Rosenthal, M.D.     Scheduled Meds: . clotrimazole   Topical TID  . enoxaparin (LOVENOX) injection  40 mg Subcutaneous Q24H  . feeding supplement  1 Container Oral TID BM  . hydrocortisone   Rectal QID  . insulin aspart  0-5 Units Subcutaneous QHS  . insulin aspart  0-9 Units Subcutaneous TID WC  . lactobacillus  1 g Oral TID WC  . loperamide  4 mg Oral Q4H  . morphine CONCENTRATE  10 mg Oral Q4H  . octreotide  100 mcg Intravenous TID  . sodium chloride  3 mL Intravenous Q12H   Continuous Infusions: . 0.9 % NaCl with KCl 20 mEq / L 100 mL/hr (04/14/12 1158)    Principal Problem:   Intractable diarrhea Active Problems:   Colon adenocarcinoma   Thyroid condition   Diabetes type 2, uncontrolled   HTN (hypertension), benign    Time spent: 20 minutes    Hollice Espy  [facility] Pager [phone]. If 7PM-7AM, please contact night-coverage at [email or web address], password [facility] 04/14/2012, 3:45 PM  LOS: 2 days

## 2012-04-15 ENCOUNTER — Encounter (HOSPITAL_COMMUNITY): Payer: Medicare Other

## 2012-04-15 LAB — GLUCOSE, CAPILLARY
Glucose-Capillary: 122 mg/dL — ABNORMAL HIGH (ref 70–99)
Glucose-Capillary: 166 mg/dL — ABNORMAL HIGH (ref 70–99)

## 2012-04-15 LAB — CBC
HCT: 33.2 % — ABNORMAL LOW (ref 36.0–46.0)
Hemoglobin: 11.1 g/dL — ABNORMAL LOW (ref 12.0–15.0)
MCHC: 33.4 g/dL (ref 30.0–36.0)
RBC: 3.38 MIL/uL — ABNORMAL LOW (ref 3.87–5.11)

## 2012-04-15 LAB — OCCULT BLOOD, POC DEVICE: Fecal Occult Bld: POSITIVE — AB

## 2012-04-15 MED ORDER — FLORANEX PO PACK: 1.0000 g | PACK | Freq: Three times a day (TID) | ORAL | Status: AC

## 2012-04-15 MED ORDER — HYDROCORTISONE 2.5 % RE CREA: TOPICAL_CREAM | Freq: Four times a day (QID) | RECTAL | Status: DC

## 2012-04-15 MED ORDER — HYOSCYAMINE SULFATE 0.125 MG SL SUBL: 0.1250 mg | SUBLINGUAL_TABLET | SUBLINGUAL | Status: DC | PRN

## 2012-04-15 NOTE — Care Management Note (Signed)
    Page 1 of 1   04/15/2012     1:31:26 PM   CARE MANAGEMENT NOTE 04/15/2012  Patient:  [name], [name]   Account Number:  [id number]  Date Initiated:  04/15/2012  Documentation initiated by:  Rosemary Holms  Subjective/Objective Assessment:   Pt admitted from home where she lives with her spouse. N,V,D after Chemo. Per pt, does not anticipate HH needs.     Action/Plan:   Anticipated DC Date:  04/15/2012   Anticipated DC Plan:  HOME/SELF CARE      DC Planning Services  CM consult      Choice offered to / List presented to:             Status of service:  Completed, signed off Medicare Important Message given?  YES (If response is "NO", the following Medicare IM given date fields will be blank) Date Medicare IM given:  04/15/2012 Date Additional Medicare IM given:    Discharge Disposition:  HOME/SELF CARE  Per UR Regulation:    If discussed at Long Length of Stay Meetings, dates discussed:    Comments:  04/15/12 Rosemary Holms RN BSN CM

## 2012-04-15 NOTE — Progress Notes (Signed)
[facility] PROGRESS NOTE  Anita SWARTZENDRUBER ZOX:[id number] DOB: Dec 14, 1940 DOA: 04/12/2012 PCP: [name], MD  Assessment/Plan: Principal Problem:   Intractable diarrhea: Looks to be secondary to recent chemotherapy treatment. Continues to improve. Patient tolerated clear liquids. Very little solid food intake no. Still very weak. Monitor overnight and discharge tomorrow. Active Problems:   Colon adenocarcinoma: Patient had PET scan scheduled for today. This is been rescheduled for Friday.   Thyroid condition: Stable   Diabetes type 2, uncontrolled: Sliding-scale only   HTN (hypertension), benign: Blood pressure stable Neutropenia: Patient asymptomatic. No signs of infection. Spoke oncology and given recent chemotherapy, numbers are to be expected. Patient on Neulasta to limit time being neutropenic  Code Status: Full code Family Communication: Plan discussed with husband at the bedside Disposition Plan: Home tomorrow   Consultants:  None  Procedures:  None  Antibiotics:  None  HPI/Subjective: Patient feeling much better. Tolerating full liquids. No complaints. When having stool, plenty of warning.  Objective: Filed Vitals:   04/14/12 2123 04/15/12 0221 04/15/12 0450 04/15/12 1047  BP: 114/77 122/77 137/74 105/71  Pulse: 65 62 68 78  Temp: 98.2 F (36.8 C) 98.2 F (36.8 C) 98 F (36.7 C) 98.4 F (36.9 C)  TempSrc: Oral Oral Oral   Resp: 20 20 20 20   Height:      Weight:      SpO2: 92% 94% 94% 94%    Intake/Output Summary (Last 24 hours) at 04/15/12 1739 Last data filed at 04/15/12 1510  Gross per 24 hour  Intake   1450 ml  Output    600 ml  Net    850 ml   Filed Weights   04/12/12 2159 04/13/12 0305 04/14/12 0538  Weight: 68.04 kg (150 lb) 71.8 kg (158 lb 4.6 oz) 75.07 kg (165 lb 8 oz)    Exam:   General:  Alert and oriented x3, no acute distress  Cardiovascular: Regular rate and rhythm, S1-S2  Respiratory: Clear to auscultation  bilaterally  Abdomen: Soft, nontender, nondistended, positive bowel sounds  Musculoskeletal: No clubbing or cyanosis or edema   Data Reviewed: Basic Metabolic Panel:  Recent Labs Lab 04/12/12 2259 04/13/12 0800  NA 131*  --   K 4.4  --   CL 98  --   CO2 17*  --   GLUCOSE 287*  --   BUN 23  --   CREATININE 0.71  --   CALCIUM 8.3*  --   MG  --  2.3   Liver Function Tests:  Recent Labs Lab 04/12/12 2259  AST 18  ALT 22  ALKPHOS 98  BILITOT 0.7  PROT 5.0*  ALBUMIN 2.7*    Recent Labs Lab 04/12/12 2259  LIPASE 21   CBC:  Recent Labs Lab 04/12/12 2259 04/15/12 0844  WBC 20.6* 1.9*  NEUTROABS 19.6*  --   HGB 12.6 11.1*  HCT 36.2 33.2*  MCV 94.3 98.2  PLT 193 98*   Cardiac Enzymes:  Recent Labs Lab 04/12/12 2259  TROPONINI <0.30   BNP (last 3 results) No results found for this basename: PROBNP,  in the last 8760 hours CBG:  Recent Labs Lab 04/14/12 1637 04/14/12 2106 04/15/12 0742 04/15/12 1153 04/15/12 1556  GLUCAP 190* 140* 190* 122* 166*    Recent Results (from the past 240 hour(s))  STOOL CULTURE     Status: None   Collection Time    04/12/12 11:56 PM      Result Value Range Status   Specimen  Description STOOL   Final   Special Requests NONE   Final   Culture Culture reincubated for better growth   Final   Report Status PENDING   Incomplete  CLOSTRIDIUM DIFFICILE BY PCR     Status: None   Collection Time    04/12/12 11:56 PM      Result Value Range Status   C difficile by pcr NEGATIVE  NEGATIVE Final  URINE CULTURE     Status: None   Collection Time    04/13/12 12:25 AM      Result Value Range Status   Specimen Description URINE, CATHETERIZED   Final   Special Requests NONE   Final   Culture  Setup Time 04/13/2012 20:52   Final   Colony Count NO GROWTH   Final   Culture NO GROWTH   Final   Report Status 04/14/2012 FINAL   Final     Studies: Dg Abd Acute W/chest  04/13/2012   IMPRESSION: Normal bowel gas pattern.  No  acute findings.   Original Report Authenticated By: Myles Rosenthal, M.D.     Scheduled Meds: . clotrimazole   Topical TID  . enoxaparin (LOVENOX) injection  40 mg Subcutaneous Q24H  . feeding supplement  1 Container Oral TID BM  . hydrocortisone   Rectal QID  . insulin aspart  0-5 Units Subcutaneous QHS  . insulin aspart  0-9 Units Subcutaneous TID WC  . lactobacillus  1 g Oral TID WC  . loperamide  4 mg Oral Q4H  . morphine CONCENTRATE  10 mg Oral Q4H  . octreotide  100 mcg Intravenous TID  . sodium chloride  3 mL Intravenous Q12H   Continuous Infusions: . 0.9 % NaCl with KCl 20 mEq / L 100 mL/hr at 04/15/12 0900    Principal Problem:   Intractable diarrhea Active Problems:   Colon adenocarcinoma   Thyroid condition   Diabetes type 2, uncontrolled   HTN (hypertension), benign    Time spent: 20 minutes    Hollice Espy  [facility] Pager [phone]. If 7PM-7AM, please contact night-coverage at [email or web address], password [facility] 04/15/2012, 5:39 PM  LOS: 3 days

## 2012-04-15 NOTE — Progress Notes (Signed)
UR Chart Review Completed  

## 2012-04-15 NOTE — Progress Notes (Signed)
Inpatient Diabetes Program Recommendations  AACE/ADA: New Consensus Statement on Inpatient Glycemic Control (2013)  Target Ranges:  Prepandial:   less than 140 mg/dL      Peak postprandial:   less than 180 mg/dL (1-2 hours)      Critically ill patients:  140 - 180 mg/dL   Results for CHERRYL, BABIN (MRN [id number]) as of 04/15/2012 08:15  Ref. Range 04/14/2012 07:58 04/14/2012 11:21 04/14/2012 16:37 04/14/2012 21:06  Glucose-Capillary Latest Range: 70-99 mg/dL 409 (H) 811 (H) 914 (H) 140 (H)    Inpatient Diabetes Program Recommendations Insulin - Basal: Please consider ordering low dose basal insulin; recommend Levemir 5 units daily.  Note: Patient has a history of diabetes and takes Glyburide-Metformin 5-500 mg BID at home for diabetes management.  Currently, patient is ordered to receive Novolog 0-9 units AC and Novolog 0-5 units HS for inpatient glycemic control.  Blood glucose over the past 24 hours has ranged from 140-253 mg/dl.  Please consider ordering low dose basal insulin; recommend Levemir 5 units daily.  Will continue to follow.  Thanks, Orlando Penner, RN, BSN, CCRN Diabetes Coordinator Inpatient Diabetes Program [phone]

## 2012-04-16 ENCOUNTER — Ambulatory Visit (HOSPITAL_COMMUNITY): Payer: Medicare Other | Admitting: Oncology

## 2012-04-16 DIAGNOSIS — T50904A Poisoning by unspecified drugs, medicaments and biological substances, undetermined, initial encounter: Secondary | ICD-10-CM

## 2012-04-16 DIAGNOSIS — D72829 Elevated white blood cell count, unspecified: Secondary | ICD-10-CM | POA: Diagnosis present

## 2012-04-16 DIAGNOSIS — D702 Other drug-induced agranulocytosis: Secondary | ICD-10-CM | POA: Diagnosis not present

## 2012-04-16 NOTE — Discharge Summary (Signed)
Physician Discharge Summary  [name]:[id number] DOB: [birth year] DOA: 04/12/2012  PCP: Anita Hector, MD  Admit date: 04/12/2012 Discharge date: 04/16/2012  Time spent: 20 minutes  Recommendations for Outpatient Follow-up:  1. Patient has her PET scan we scheduled for this Friday.  Discharge Diagnoses:  Principal Problem:   Intractable diarrhea Active Problems:   Colon adenocarcinoma   Thyroid condition   Diabetes type 2, uncontrolled   HTN (hypertension), benign   Discharge Condition: Improved, being discharged home  Diet recommendation: Carb modified  Filed Weights   04/12/12 2159 04/13/12 0305 04/14/12 0538  Weight: 68.04 kg (150 lb) 71.8 kg (158 lb 4.6 oz) 75.07 kg (165 lb 8 oz)    History of present illness:  [name] is [name] 72 y.o. female. Pleasant elderly Caucasian lady, medical problems as listed below, receiving chemo Tx for Ca Colon (see med list). Last cycle 2 days ago and, as was predicted , has been having steady diarrhea. So numerous sheis unable to count. Has been using imodium at home without effect.Diarhea this time is much worse than last time. No blood, no fever.  Eventually came to Ed ans has spent virtually the entire ED course sitting on the commode. Admission she was noted to have a white count of 20.   Hospital Course:  Principal Problem:   Intractable diarrhea: Patient's diarrhea was not felt to be infectious, and more related to recent chemotherapy. Following admission, symptoms persisted. Patient was unable to tolerate rectal tube placement. Fortunately, she did respond to treatment of octreotide 100 3 times a day IV, scheduled Imodium plus when necessary, Levsin and probiotic. Within 12 hours of this treatment, patient's diarrhea became much more solid. I was able to be advanced and by 4/15 she was able to be discharged home. Active Problems:   Colon adenocarcinoma: Patient had a PET scan scheduled for 4/14. Is able to be  rescheduled for 4/18.    Thyroid condition   Diabetes type 2, uncontrolled: Kept on sliding scale.   HTN (hypertension), benign: Blood pressure stable.  Neutropenia: By hospital day 3, patient's white count was down to 1.9. Afebrile. Discussed with oncology and felt to be secondary to chemotherapy treatment. Patient given Neulasta to limit. Time that she is neutropenic. Said she's not having any acute fevers, this was felt to be stable. Patient will followup with oncology as outpatient as scheduled.  Leukocytosis: On admission. Not felt to be infectious, and more likely related to Neulasta treatment. Result. Procedures:  None  Consultations:  None  Discharge Exam: Filed Vitals:   04/15/12 1047 04/15/12 2145 04/16/12 0147 04/16/12 0418  BP: 105/71 122/73 112/69 132/81  Pulse: 78 82 73 86  Temp: 98.4 F (36.9 C) 98 F (36.7 C) 98 F (36.7 C) 98.3 F (36.8 C)  TempSrc:  Oral Oral Oral  Resp: 20 17 16 18   Height:      Weight:      SpO2: 94% 95% 90% 99%    General: Alert and oriented x3, no acute distress Cardiovascular: Regular rate and rhythm, S1-S2 Respiratory:  Clear to auscultation bilaterally  Discharge Instructions  Discharge Orders   Future Appointments Provider Department Dept Phone   04/19/2012 9:00 AM [facility] [phone]   Pt should arrive15 minutes prior to scheduled appt time. Please inform patient that exam will take a minimum of 1 1/2 hours. Patient to be NPO 6 hours prior to exam  and should not take any  insulin the day of exam.   04/22/2012 11:30 AM Anita An, MD [facility] [phone]   Future Orders Complete By Expires     Diet Carb Modified  As directed     Increase activity slowly  As directed         Medication List    TAKE these medications       dextrose 5 % SOLN 1,000 mL with fluorouracil 5 GM/100ML SOLN  Inject into the vein. Every 14 days     glyBURIDE-metformin  5-500 MG per tablet  Commonly known as:  GLUCOVANCE  Take 1 tablet by mouth 2 (two) times daily with a meal.     hydrocortisone 2.5 % rectal cream  Commonly known as:  ANUSOL-HC  Place rectally 4 (four) times daily. For next 5 days     hyoscyamine 0.125 MG SL tablet  Commonly known as:  LEVSIN SL  Place 1 tablet (0.125 mg total) under the tongue every 4 (four) hours as needed for cramping (bowel cramping).     IRINOTECAN HCL IV  Inject into the vein. Every 14 days     lactobacillus Pack  Take 1 packet (1 g total) by mouth 3 (three) times daily with meals.     leucovorin 10 MG/ML chemo injection  Commonly known as:  WELLCOVORIN  Inject into the vein once. Every 14 days     levothyroxine 100 MCG tablet  Commonly known as:  SYNTHROID, LEVOTHROID  Take 100 mcg by mouth daily.     loperamide 2 MG tablet  Commonly known as:  IMODIUM A-D  Take 2 tab at onset of diarrhea, then 1 tab q2h until 12 hr have passed without a BM.  May take 2 tab q4h qhs. If diarrhea recurs repeat.     LORazepam 0.5 MG tablet  Commonly known as:  ATIVAN  Take 1 tablet (0.5 mg total) by mouth every 6 (six) hours as needed (Nausea or vomiting).     losartan 50 MG tablet  Commonly known as:  COZAAR  Take 50 mg by mouth daily.     ondansetron 8 MG tablet  Commonly known as:  ZOFRAN  Take 8 mg by mouth. Starting the day after chemo, take 1 tablet in the am and 1 tablet in the pm for 2 days. Then may take 1 tablet two times a day IF needed for nausea/vomiting.           Follow-up Information   Follow up with Anita Hector, MD In 1 month.   Contact information:   [address] [facility] [address] [phone]       Follow up with Anita An, MD In 6 weeks. (As scheduled)    Contact information:   [address] [place] [address] [phone]       Follow up with PET Scan on Friday.       The results of significant diagnostics from this hospitalization (including imaging,  microbiology, ancillary and laboratory) are listed below for reference.    Significant Diagnostic Studies: Dg Abd Acute W/chest  04/13/2012   IMPRESSION: Normal bowel gas pattern.  No acute findings.   Original Report Authenticated By: Myles Rosenthal, M.D.     Microbiology: Recent Results (from the past 240 hour(s))  STOOL CULTURE     Status: None   Collection Time    04/12/12 11:56 PM      Result Value Range Status   Specimen Description STOOL   Final  Special Requests NONE   Final   Culture Culture reincubated for better growth   Final   Report Status PENDING   Incomplete  CLOSTRIDIUM DIFFICILE BY PCR     Status: None   Collection Time    04/12/12 11:56 PM      Result Value Range Status   C difficile by pcr NEGATIVE  NEGATIVE Final  URINE CULTURE     Status: None   Collection Time    04/13/12 12:25 AM      Result Value Range Status   Specimen Description URINE, CATHETERIZED   Final   Special Requests NONE   Final   Culture  Setup Time 04/13/2012 20:52   Final   Colony Count NO GROWTH   Final   Culture NO GROWTH   Final   Report Status 04/14/2012 FINAL   Final     Labs: Basic Metabolic Panel:  Recent Labs Lab 04/12/12 2259 04/13/12 0800  NA 131*  --   K 4.4  --   CL 98  --   CO2 17*  --   GLUCOSE 287*  --   BUN 23  --   CREATININE 0.71  --   CALCIUM 8.3*  --   MG  --  2.3   Liver Function Tests:  Recent Labs Lab 04/12/12 2259  AST 18  ALT 22  ALKPHOS 98  BILITOT 0.7  PROT 5.0*  ALBUMIN 2.7*    Recent Labs Lab 04/12/12 2259  LIPASE 21   CBC:  Recent Labs Lab 04/12/12 2259 04/15/12 0844  WBC 20.6* 1.9*  NEUTROABS 19.6*  --   HGB 12.6 11.1*  HCT 36.2 33.2*  MCV 94.3 98.2  PLT 193 98*   Cardiac Enzymes:  Recent Labs Lab 04/12/12 2259  TROPONINI <0.30   CBG:  Recent Labs Lab 04/15/12 0742 04/15/12 1153 04/15/12 1556 04/15/12 2144 04/16/12 0746  GLUCAP 190* 122* 166* 160* 185*       Signed:  Ollie Delano K  [facility] 04/16/2012, 10:04 AM

## 2012-04-16 NOTE — Progress Notes (Signed)
Discharge instructions given, verbalized understanding, out in stable condition via w/c with staff. 

## 2012-04-17 ENCOUNTER — Observation Stay (HOSPITAL_COMMUNITY): Payer: Medicare Other

## 2012-04-18 LAB — STOOL CULTURE

## 2012-04-19 ENCOUNTER — Encounter (HOSPITAL_COMMUNITY): Payer: Self-pay

## 2012-04-19 ENCOUNTER — Ambulatory Visit (HOSPITAL_COMMUNITY)
Admit: 2012-04-19 | Discharge: 2012-04-19 | Disposition: A | Discharge status: Home / Self Care | Payer: Medicare Other | Attending: Oncology | Admitting: Oncology

## 2012-04-19 DIAGNOSIS — C189 Malignant neoplasm of colon, unspecified: Secondary | ICD-10-CM

## 2012-04-19 LAB — GLUCOSE, CAPILLARY: Glucose-Capillary: 227 mg/dL — ABNORMAL HIGH (ref 70–99)

## 2012-04-19 MED ORDER — FLUDEOXYGLUCOSE F - 18 (FDG) INJECTION
18.9000 | Freq: Once | INTRAVENOUS | Status: AC | PRN
  Administered 2012-04-19: 18.9 via INTRAVENOUS

## 2012-04-22 ENCOUNTER — Encounter (HOSPITAL_BASED_OUTPATIENT_CLINIC_OR_DEPARTMENT_OTHER): Payer: Medicare Other | Admitting: Oncology

## 2012-04-22 VITALS — BP 142/93 | HR 123 | Temp 97.6°F | Resp 18 | Wt 153.9 lb

## 2012-04-22 DIAGNOSIS — C189 Malignant neoplasm of colon, unspecified: Secondary | ICD-10-CM

## 2012-04-22 DIAGNOSIS — R5383 Other fatigue: Secondary | ICD-10-CM

## 2012-04-22 DIAGNOSIS — C19 Malignant neoplasm of rectosigmoid junction: Secondary | ICD-10-CM

## 2012-04-22 DIAGNOSIS — C787 Secondary malignant neoplasm of liver and intrahepatic bile duct: Secondary | ICD-10-CM

## 2012-04-22 DIAGNOSIS — R911 Solitary pulmonary nodule: Secondary | ICD-10-CM

## 2012-04-22 LAB — COMPREHENSIVE METABOLIC PANEL
ALT: 16 U/L (ref 0–35)
AST: 19 U/L (ref 0–37)
Alkaline Phosphatase: 97 U/L (ref 39–117)
CO2: 19 mEq/L (ref 19–32)
Chloride: 92 mEq/L — ABNORMAL LOW (ref 96–112)
GFR calc Af Amer: >90 mL/min (ref >90)
GFR calc non Af Amer: 89 mL/min — ABNORMAL LOW (ref >90)
Glucose, Bld: 320 mg/dL — ABNORMAL HIGH (ref 70–99)
Potassium: 2.7 mEq/L — CL (ref 3.5–5.1)
Sodium: 133 mEq/L — ABNORMAL LOW (ref 135–145)

## 2012-04-22 LAB — CBC WITH DIFFERENTIAL/PLATELET
Band Neutrophils: 3 % (ref 0–10)
Basophils Absolute: 0 10*3/uL (ref 0.0–0.1)
Basophils Relative: 0 % (ref 0–1)
Blasts: 0 %
Lymphocytes Relative: 20 % (ref 12–46)
Lymphs Abs: 1.6 10*3/uL (ref 0.7–4.0)
MCH: 32.2 pg (ref 26.0–34.0)
MCHC: 33.9 g/dL (ref 30.0–36.0)
Myelocytes: 1 %
Neutro Abs: 5.9 10*3/uL (ref 1.7–7.7)
Neutrophils Relative %: 65 % (ref 43–77)
Promyelocytes Absolute: 0 %
RDW: 15.2 % (ref 11.5–15.5)

## 2012-04-22 MED ORDER — HEPARIN SOD (PORK) LOCK FLUSH 100 UNIT/ML IV SOLN
INTRAVENOUS | Status: AC
  Filled 2012-04-22: qty 5

## 2012-04-22 MED ORDER — SODIUM CHLORIDE 0.9 % IJ SOLN
10.0000 mL | INTRAMUSCULAR | Status: DC | PRN
  Administered 2012-04-22: 10 mL via INTRAVENOUS
  Filled 2012-04-22: qty 10

## 2012-04-22 MED ORDER — HEPARIN SOD (PORK) LOCK FLUSH 100 UNIT/ML IV SOLN
500.0000 [IU] | Freq: Once | INTRAVENOUS | Status: AC
  Administered 2012-04-22: 500 [IU] via INTRAVENOUS
  Filled 2012-04-22: qty 5

## 2012-04-22 NOTE — Progress Notes (Signed)
[name] presented for Portacath access and flush. Proper placement of portacath confirmed by CXR. Portacath located left chest wall accessed with  H 20 needle. Sluggish blood return. Repositioned patient. Flushed easily several times but would not give adequate blood flow for lab specimen. Portacath flushed with 20ml NS and 500U/66ml Heparin and needle removed intact. Procedure without incident. Patient tolerated procedure well.  [name] presented for [employer]. Labs per MD order drawn via Peripheral Line 23 gauge needle inserted in left antecubital.  Good blood return present. Procedure without incident.  Needle removed intact. Patient tolerated procedure well.

## 2012-04-22 NOTE — Patient Instructions (Signed)
[facility] Discharge Instructions  RECOMMENDATIONS MADE BY THE CONSULTANT AND ANY TEST RESULTS WILL BE SENT TO YOUR REFERRING PHYSICIAN.  EXAM FINDINGS BY THE PHYSICIAN TODAY AND SIGNS OR SYMPTOMS TO REPORT TO CLINIC OR PRIMARY PHYSICIAN: PET scan was GOOD!!  SPECIAL INSTRUCTIONS/FOLLOW-UP: Return to see [name] in 2 weeks, please see the front desk as you leave for appointment.  Please call [name] if you need [name] sooner.    Thank you for choosing [name] to provide your oncology and hematology care.  To afford each patient quality time with our providers, please arrive at least 15 minutes before your scheduled appointment time.  With your help, our goal is to use those 15 minutes to complete the necessary work-up to ensure our physicians have the information they need to help with your evaluation and healthcare recommendations.    Effective January 1st, 2014, we ask that you re-schedule your appointment with our physicians should you arrive 10 or more minutes late for your appointment.  We strive to give you quality time with our providers, and arriving late affects you and other patients whose appointments are after yours.    Again, thank you for choosing [facility].  Our hope is that these requests will decrease the amount of time that you wait before being seen by our physicians.       _____________________________________________________________  Should you have questions after your visit to [facility], please contact our office at [phone] between the hours of 8:30 a.m. and 5:00 p.m.  Voicemails left after 4:30 p.m. will not be returned until the following business day.  For prescription refill requests, have your pharmacy contact our office with your prescription refill request.

## 2012-04-22 NOTE — Progress Notes (Signed)
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 She is status post RFA ablation initially of single liver lesion in August 2012 followed by 6 cycles of FOLFOX/bevacizumab. That was completed on 04/03/2011. She then had a rising CEA level with recurrent disease in the liver and probably in the lung with small non-hypermetabolic pulmonary nodules.  now having had 3 full cycles of Folfiri/bevacizumab her PET scan shows no activity in the liver nor in the lungs but several small nodules are clearly smaller. What I told her was that her PET scan is negative for hypermetabolic activity anywhere but again we have never biopsied one of the lung nodules.  She spent last week in the hospital with nausea diarrhea dehydration and severe weakness. She remains very very weak I believe is still dehydrated. Her heart rate is around 120 still. That is atypical for her. Her weight is down from 178 pounds in February 2014 253 pounds today.  The diarrhea has stopped. She denies shortness of breath, nausea, vomiting etc.  She had on exam a week appearance. She has no adenopathy. Lungs are clear. Heart shows a regular rhythm but heart rate is 120 sitting. Abdomen is soft and nontender without hepatosplenomegaly or masses or tenderness or ascites. She is no leg edema or arm edema. Port-A-Cath is intact in the left upper chest.   She wants a break in therapy and clearly needs a break in therapy for 10-14 days. She is not sure she wants to restart the Folfiri/avastin.. If she does not want to restart in 2 weeks that is fine. We can give her break and follow her along since this is palliative in nature at this point in time. We do not realistically have a chance to cure this lady at this juncture. So therefore we will see her in 2 weeks she will decide what she wants to do at that time.

## 2012-04-22 NOTE — Progress Notes (Signed)
Patient's husband called and told about potassium level.  Husband given instructions for patient to take Potassium tabs 6 tablets a day per [place], [place].  He verbalized understanding.   CRITICAL VALUE ALERT  Critical value received:  Potassium-2.7  Date of notification:  04/22/2012  Time of notification:  1515  Critical value read back:yes  Nurse who received alert:  Flonnie Overman, RN   MD notified (1st page):  Dellis Anes, PA  Time of first page:  [phone]

## 2012-04-25 ENCOUNTER — Other Ambulatory Visit (HOSPITAL_COMMUNITY): Payer: Self-pay | Admitting: Oncology

## 2012-04-25 DIAGNOSIS — N390 Urinary tract infection, site not specified: Secondary | ICD-10-CM

## 2012-04-25 MED ORDER — SULFAMETHOXAZOLE-TRIMETHOPRIM 800-160 MG PO TABS: 1.0000 | ORAL_TABLET | Freq: Two times a day (BID) | ORAL | Status: AC

## 2012-05-07 ENCOUNTER — Encounter (HOSPITAL_COMMUNITY): Payer: Medicare Other | Attending: Oncology | Admitting: Oncology

## 2012-05-07 ENCOUNTER — Encounter (HOSPITAL_COMMUNITY): Payer: Self-pay | Admitting: Oncology

## 2012-05-07 VITALS — BP 116/80 | HR 97 | Temp 97.5°F | Resp 18 | Wt 152.1 lb

## 2012-05-07 DIAGNOSIS — R918 Other nonspecific abnormal finding of lung field: Secondary | ICD-10-CM

## 2012-05-07 DIAGNOSIS — C189 Malignant neoplasm of colon, unspecified: Secondary | ICD-10-CM | POA: Insufficient documentation

## 2012-05-07 DIAGNOSIS — C787 Secondary malignant neoplasm of liver and intrahepatic bile duct: Secondary | ICD-10-CM

## 2012-05-07 DIAGNOSIS — C19 Malignant neoplasm of rectosigmoid junction: Secondary | ICD-10-CM

## 2012-05-07 NOTE — Progress Notes (Signed)
Anita Hector, MD [address] [place] [address]  Colon adenocarcinoma  CURRENT THERAPY: S/P 6 treatments of FOLFIRI + Avastin from 01/16/2012- 04/08/2012  INTERVAL HISTORY: [name] 72 y.o. 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 She is status post RFA ablation initially of single liver lesion in August 2012 followed by 6 cycles of FOLFOX/bevacizumab. That was completed on 04/03/2011. She then had a rising CEA level with recurrent disease in the liver and probably in the lung with small non-hypermetabolic pulmonary nodules. Now S/P 3 full cycles of Folfiri/bevacizumab and her PET scan shows no activity in the liver nor in the lungs but several small nodules are clearly smaller.   [name] is slowly improving.    She reports that her appetite is slowly improving.  She reports her taste for foods remains decreased, but she admits that her taste is slowly improving.  I suspect that her taste will recover in 4-6 weeks, hopefully back to 100%.   She reports that she is constipated.  Last week she had episodes of diarrhea which unfortunately landed her in the hospital where she was treated symptomatically.  She reports that he last BM was on Tuesday, 1 week ago.  She was educated on a bowel regimen.  She was encouraged to try MiraLax to help with her bowels.  She reports that she does not tolerate MOM well which causes nausea.  I recommended starting with 1/2  Capful today and a full capful tomorrow if no BM.  She will contact the clinic tomorrow or Thursday if no BM occurrence.   She reports that overall, she is improving slowly.  She is in a wheelchair today.   Together, we have made the decision to hold therapy for 8 weeks and re-evaluate the patient at that time.  She is agreeable to this plan.  Oncologically, she otherwise denies any complaints and ROS questioning is  negative.   Past Medical History  Diagnosis Date  . Colon cancer     liver ca  . HTN (hypertension)   . Thyroid condition   . Diabetes mellitus   . Shingles   . Acute UTI   . Nausea 02/06/2012    has Colon adenocarcinoma; Nausea; Thyroid condition; Colon cancer; Intractable diarrhea; Diabetes type 2, uncontrolled; HTN (hypertension), benign; and Neutropenia, drug-induced on her problem list.     has No Known Allergies.  [name] had no medications administered during this visit.  Past Surgical History  Procedure Laterality Date  . Colectomy      and bil. oopherectomy  . Appendectomy    . Blt    . Resection liver partial / total w/ radiofrequency ablation    . Port-a-cath removal    . Portacath placement  09/2010  . Abdominal hysterectomy      Denies any headaches, dizziness, double vision, fevers, chills, night sweats, nausea, vomiting, diarrhea, constipation, chest pain, heart palpitations, shortness of breath, blood in stool, black tarry stool, urinary pain, urinary burning, urinary frequency, hematuria.   PHYSICAL EXAMINATION  ECOG PERFORMANCE STATUS: 3 - Symptomatic, >50% confined to bed  Filed Vitals:   05/07/12 1500  BP: 116/80  Pulse: 97  Temp: 97.5 F (36.4 C)  Resp: 18    GENERAL:alert, no distress, well nourished, well developed, comfortable, cooperative, smiling and chronically ill appearing and fatigued in appearance.  SKIN: skin color, texture, turgor are normal, no rashes or  significant lesions HEAD: Normocephalic, No masses, lesions, tenderness or abnormalities EYES: normal, Conjunctiva are pink and non-injected EARS: External ears normal OROPHARYNX:mucous membranes are moist  NECK: supple, no adenopathy, thyroid normal size, non-tender, without nodularity, no stridor, non-tender, trachea midline LYMPH:  no palpable lymphadenopathy BREAST:not examined LUNGS: clear to auscultation  HEART: regular rate & rhythm, no murmurs, no gallops, S1 normal and  S2 normal ABDOMEN:abdomen soft, non-tender, normal bowel sounds BACK: Back symmetric, no curvature., No CVA tenderness EXTREMITIES:less then 2 second capillary refill, no joint deformities, effusion, or inflammation, no edema, no skin discoloration, no clubbing, no cyanosis  NEURO: alert & oriented x 3 with fluent speech, no focal motor/sensory deficits    LABORATORY DATA: CBC    Component Value Date/Time   WBC 8.1 04/22/2012 1247   RBC 3.95 04/22/2012 1247   HGB 12.7 04/22/2012 1247   HCT 37.5 04/22/2012 1247   PLT 315 04/22/2012 1247   MCV 94.9 04/22/2012 1247   MCH 32.2 04/22/2012 1247   MCHC 33.9 04/22/2012 1247   RDW 15.2 04/22/2012 1247   LYMPHSABS 1.6 04/22/2012 1247   MONOABS 0.6 04/22/2012 1247   EOSABS 0.0 04/22/2012 1247   BASOSABS 0.0 04/22/2012 1247      Chemistry      Component Value Date/Time   NA 133* 04/22/2012 1247   K 2.7* 04/22/2012 1247   CL 92* 04/22/2012 1247   CO2 19 04/22/2012 1247   BUN 8 04/22/2012 1247   CREATININE 0.63 04/22/2012 1247      Component Value Date/Time   CALCIUM 8.5 04/22/2012 1247   ALKPHOS 97 04/22/2012 1247   AST 19 04/22/2012 1247   ALT 16 04/22/2012 1247   BILITOT 0.4 04/22/2012 1247     Lab Results  Component Value Date   CEA 5.0 04/22/2012      ASSESSMENT:  1. 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 She is status post RFA ablation initially of single liver lesion in August 2012 followed by 6 cycles of FOLFOX/bevacizumab. That was completed on 04/03/2011. She then had a rising CEA level with recurrent disease in the liver and probably in the lung with small non-hypermetabolic pulmonary nodules. Now S/P 3 full cycles of Folfiri/bevacizumab and her PET scan shows no activity in the liver nor in the lungs but several small nodules are clearly smaller. 2. Fatigue, improving 3. Decreased appetite, improving 4. Decreased taste for foods, improving  Patient Active Problem List    Diagnosis Date Noted  . Neutropenia, drug-induced 04/16/2012  . Intractable diarrhea 04/13/2012  . Diabetes type 2, uncontrolled 04/13/2012  . HTN (hypertension), benign 04/13/2012  . Thyroid condition   . Nausea 02/06/2012  . Colon adenocarcinoma 08/23/2010    Class: Diagnosis of    PLAN:  1. Patient education regarding bowel regimen.  Recommend MiraLax 1/2 cap today and 1 cap tomorrow is no BM.  She will call the clinic tomorrow or Thursday if no BM. 2. Hold chemotherapy for 8 weeks. 3. Labs in 8 weeks: CBC diff, CMET, CEA 4. She has an upcoming appt with PCP. 5. Return in 8 weeks for follow-up.   All questions were answered. The patient knows to call the clinic with any problems, questions or concerns. We can certainly see the patient much sooner if necessary.  The patient and plan discussed with Glenford Peers, MD and he is in agreement with the aforementioned.   KEFALAS,THOMAS

## 2012-05-07 NOTE — Patient Instructions (Addendum)
[facility] Discharge Instructions  RECOMMENDATIONS MADE BY THE CONSULTANT AND ANY TEST RESULTS WILL BE SENT TO YOUR REFERRING PHYSICIAN.  We will give you an 8 week break from chemotherapy. We will see you back and do lab work and see how you are feeling and make plans for you then. Report any issues/concerns to clinic as needed.  Thank you for choosing [name] to provide your oncology and hematology care.  To afford each patient quality time with our providers, please arrive at least 15 minutes before your scheduled appointment time.  With your help, our goal is to use those 15 minutes to complete the necessary work-up to ensure our physicians have the information they need to help with your evaluation and healthcare recommendations.    Effective January 1st, 2014, we ask that you re-schedule your appointment with our physicians should you arrive 10 or more minutes late for your appointment.  We strive to give you quality time with our providers, and arriving late affects you and other patients whose appointments are after yours.    Again, thank you for choosing [facility]  Our hope is that these requests will decrease the amount of time that you wait before being seen by our physicians.       _____________________________________________________________  Should you have questions after your visit to [facility], please contact our office at [phone] between the hours of 8:30 a.m. and 5:00 p.m.  Voicemails left after 4:30 p.m. will not be returned until the following business day.  For prescription refill requests, have your pharmacy contact our office with your prescription refill request.

## 2012-05-10 ENCOUNTER — Encounter (HOSPITAL_COMMUNITY): Payer: Self-pay | Admitting: Dietician

## 2012-05-10 NOTE — Progress Notes (Signed)
Nutrition Note  Pt identified with weight loss on [facility] Nutrition Screen.   Wt Readings from Last 10 Encounters:  05/07/12 152 lb 1.6 oz (68.992 kg)  04/22/12 153 lb 14.4 oz (69.809 kg)  04/14/12 165 lb 8 oz (75.07 kg)  04/08/12 160 lb 9.6 oz (72.848 kg)  03/13/12 175 lb 12.8 oz (79.742 kg)  02/28/12 173 lb 9.6 oz (78.744 kg)  02/20/12 176 lb 6.4 oz (80.015 kg)  02/06/12 178 lb (80.74 kg)  02/06/12 178 lb (80.74 kg)  01/30/12 179 lb 9.6 oz (81.466 kg)   Chart reviewed. Pt familiar to this RD due to previous hospitalization about 1 month ago, where she met criteria for severe malnutrition in the context of chronic illness due to wt loss and poor po intake (see RD note on 04/12/12 for further details). Pt with colon CA with mets to the liver. Recent PET scan showed no activity in lungs or or liver.  Wt hx reveals 26# (14%) wt loss x 3 months and 8# (5%) wt loss x 1 month, both of which are clinically significant. Both decreased appetite and limited food acceptance continue, with some improvement per MD notes. Pt continues with severe malnutrition in the context of chronic illness given significant wt loss at 3 months (14%) and 1 month (5%) as well as <75% of estimated energy needs consumed x 1 month.  Noted that pt is current receiving palliative chemotherapy, but has elected to put chemotherapy on hold for 8 weeks and reevaluate then.  Will continue to follow.   Melody Haver, RD, LDN Pager: [phone]

## 2012-06-03 ENCOUNTER — Encounter (HOSPITAL_COMMUNITY): Payer: Medicare Other | Attending: Oncology

## 2012-06-03 DIAGNOSIS — Z95828 Presence of other vascular implants and grafts: Secondary | ICD-10-CM

## 2012-06-03 DIAGNOSIS — C787 Secondary malignant neoplasm of liver and intrahepatic bile duct: Secondary | ICD-10-CM

## 2012-06-03 DIAGNOSIS — Z452 Encounter for adjustment and management of vascular access device: Secondary | ICD-10-CM

## 2012-06-03 DIAGNOSIS — Z9889 Other specified postprocedural states: Secondary | ICD-10-CM | POA: Insufficient documentation

## 2012-06-03 DIAGNOSIS — C19 Malignant neoplasm of rectosigmoid junction: Secondary | ICD-10-CM

## 2012-06-03 MED ORDER — HEPARIN SOD (PORK) LOCK FLUSH 100 UNIT/ML IV SOLN
500.0000 [IU] | Freq: Once | INTRAVENOUS | Status: AC
  Administered 2012-06-03: 500 [IU] via INTRAVENOUS
  Filled 2012-06-03: qty 5

## 2012-06-03 MED ORDER — HEPARIN SOD (PORK) LOCK FLUSH 100 UNIT/ML IV SOLN
INTRAVENOUS | Status: AC
  Filled 2012-06-03: qty 5

## 2012-06-03 MED ORDER — SODIUM CHLORIDE 0.9 % IJ SOLN
10.0000 mL | INTRAMUSCULAR | Status: DC | PRN
  Administered 2012-06-03: 10 mL via INTRAVENOUS
  Filled 2012-06-03: qty 10

## 2012-07-02 ENCOUNTER — Ambulatory Visit (HOSPITAL_COMMUNITY): Payer: Medicare Other

## 2012-07-04 ENCOUNTER — Encounter (HOSPITAL_COMMUNITY): Payer: Medicare Other | Attending: Internal Medicine

## 2012-07-04 ENCOUNTER — Encounter (HOSPITAL_COMMUNITY): Payer: Self-pay

## 2012-07-04 VITALS — BP 158/89 | HR 71 | Temp 97.8°F | Resp 16 | Wt 163.3 lb

## 2012-07-04 DIAGNOSIS — C189 Malignant neoplasm of colon, unspecified: Secondary | ICD-10-CM

## 2012-07-04 LAB — COMPREHENSIVE METABOLIC PANEL
Alkaline Phosphatase: 46 U/L (ref 39–117)
BUN: 7 mg/dL (ref 6–23)
Calcium: 9.6 mg/dL (ref 8.4–10.5)
Creatinine, Ser: 0.63 mg/dL (ref 0.50–1.10)
GFR calc Af Amer: >90 mL/min (ref >90)
Glucose, Bld: 131 mg/dL — ABNORMAL HIGH (ref 70–99)
Total Protein: 6.9 g/dL (ref 6.0–8.3)

## 2012-07-04 LAB — CBC WITH DIFFERENTIAL/PLATELET
Basophils Relative: 0 % (ref 0–1)
Eosinophils Absolute: 0.1 10*3/uL (ref 0.0–0.7)
Eosinophils Relative: 2 % (ref 0–5)
Hemoglobin: 11.8 g/dL — ABNORMAL LOW (ref 12.0–15.0)
Lymphs Abs: 2.3 10*3/uL (ref 0.7–4.0)
MCH: 32 pg (ref 26.0–34.0)
MCHC: 32.4 g/dL (ref 30.0–36.0)
MCV: 98.6 fL (ref 78.0–100.0)
Monocytes Relative: 8 % (ref 3–12)
RBC: 3.69 MIL/uL — ABNORMAL LOW (ref 3.87–5.11)

## 2012-07-04 MED ORDER — HEPARIN SOD (PORK) LOCK FLUSH 100 UNIT/ML IV SOLN
INTRAVENOUS | Status: AC
  Filled 2012-07-04: qty 5

## 2012-07-04 MED ORDER — SODIUM CHLORIDE 0.9 % IJ SOLN
10.0000 mL | INTRAMUSCULAR | Status: DC | PRN
  Administered 2012-07-04: 10 mL via INTRAVENOUS
  Filled 2012-07-04: qty 10

## 2012-07-04 MED ORDER — HEPARIN SOD (PORK) LOCK FLUSH 100 UNIT/ML IV SOLN
500.0000 [IU] | Freq: Once | INTRAVENOUS | Status: AC
  Administered 2012-07-04: 500 [IU] via INTRAVENOUS
  Filled 2012-07-04: qty 5

## 2012-07-04 NOTE — Progress Notes (Signed)
Anita Hector, MD [address] [place] [address]  No diagnosis found.  CURRENT THERAPY: S/P 6 treatments of FOLFIRI + Avastin from 01/16/2012- 04/08/2012  INTERVAL HISTORY: [name] 72 y.o. 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 She is status post RFA ablation initially of single liver lesion in August 2012 followed by 6 cycles of FOLFOX/bevacizumab. That was completed on 04/03/2011. She then had a rising CEA level with recurrent disease in the liver and probably in the lung with small non-hypermetabolic pulmonary nodules. Now S/P 3 full cycles of Folfiri/bevacizumab and her PET scan shows no activity in the liver nor in the lungs but several small nodules are clearly smaller.   Patient returns for continued oncology followup per day, she just received chemotherapy from April 27 of April 29 and has been on a 2 month break since the chemotherapy made her very weak and she was hospitalized for a prolonged period of time. States that she continues to improve well. Eating better. Denies any diarrhea or sores in mouth. No difficulty swallowing. Denies any new cough, dyspnea or chest pain. No hemoptysis or blood in the stools. No abdominal pain. Bowel movements are regular at this time.  She otherwise denies any complaints and ROS questioning is negative.   Past Medical History  Diagnosis Date  . Colon cancer     liver ca  . HTN (hypertension)   . Thyroid condition   . Diabetes mellitus   . Shingles   . Acute UTI   . Nausea 02/06/2012    has Colon adenocarcinoma; Nausea; Thyroid condition; Intractable diarrhea; Diabetes type 2, uncontrolled; HTN (hypertension), benign; and Neutropenia, drug-induced on her problem list.     has No Known Allergies.  Ms. Barman had no medications administered during this visit.  Past Surgical History  Procedure Laterality Date  . Colectomy       and bil. oopherectomy  . Appendectomy    . Blt    . Resection liver partial / total w/ radiofrequency ablation    . Port-a-cath removal      has new port now  . Portacath placement  09/2010  . Oophorectomy Bilateral 2007    ROS: Denies any headaches, dizziness, double vision, fevers, chills, night sweats, nausea, vomiting, diarrhea, constipation, chest pain, heart palpitations, shortness of breath, blood in stool, black tarry stool, urinary pain, urinary burning, urinary frequency, hematuria, imbalance or falls.   PHYSICAL EXAMINATION  ECOG PERFORMANCE STATUS: 2 - Symptomatic, <50% confined to bed  Filed Vitals:   07/04/12 0947  BP: 158/89  Pulse: 71  Temp: 97.8 F (36.6 C)  Resp: 16    GENERAL patient is :alert, no distress, well nourished, oriented. No icterus.  SKIN: skin color, texture, turgor are normal, no rashes  HEAD: Normocephalic, No masses, lesions, tenderness  EYES: normal, Conjunctiva are pink and non-injected EARS: External ears normal OROPHARYNX:mucous membranes are moist  NECK: supple, no adenopathy,   LUNGS: clear to auscultation  HEART: S1S2, regular rate & rhythm, no murmurs  ABDOMEN: soft, non-tender, no hepatomegaly, normal bowel sounds EXTREMITIES: trace pedal edema, no cyanosis  NEURO: alert & oriented x 3 with fluent speech, nonfocal,  cranial nerves intact     LABORATORY DATA: CBC    Component Value Date/Time   WBC 8.1 04/22/2012 1247   RBC 3.95 04/22/2012 1247   HGB 12.7 04/22/2012 1247   HCT 37.5 04/22/2012 1247  PLT 315 04/22/2012 1247   MCV 94.9 04/22/2012 1247   MCH 32.2 04/22/2012 1247   MCHC 33.9 04/22/2012 1247   RDW 15.2 04/22/2012 1247   LYMPHSABS 1.6 04/22/2012 1247   MONOABS 0.6 04/22/2012 1247   EOSABS 0.0 04/22/2012 1247   BASOSABS 0.0 04/22/2012 1247      Chemistry      Component Value Date/Time   NA 133* 04/22/2012 1247   K 2.7* 04/22/2012 1247   CL 92* 04/22/2012 1247   CO2 19 04/22/2012 1247   BUN 8 04/22/2012 1247    CREATININE 0.63 04/22/2012 1247      Component Value Date/Time   CALCIUM 8.5 04/22/2012 1247   ALKPHOS 97 04/22/2012 1247   AST 19 04/22/2012 1247   ALT 16 04/22/2012 1247   BILITOT 0.4 04/22/2012 1247     Lab Results  Component Value Date   CEA 5.0 04/22/2012      ASSESSMENT/PLAN:  Metastatic recurrent adenocarcinoma (KRAS MUTATION DETECTED) of the colon to liver and probably lung. She is status post RFA ablation initially of single liver lesion in August 2012 followed by 6 cycles of FOLFOX/bevacizumab. That was completed on 04/03/2011. She then had a rising CEA level with recurrent disease in the liver and probably in the lung with small non-hypermetabolic pulmonary nodules. Now S/P 3 full cycles of Folfiri/bevacizumab and her PET scan shows no activity in the liver nor in the lungs but several small nodules were clearly smaller. Patient is clinically doing much better, eating well and maintaining weight. Performance status is also, currently ECOG 2. Had lengthy discussion with patient and explained that given metastatic disease she will most likely/progress in the future and discussed further options including resuming previous chemotherapy versus considering significant bevacizumab maintenance versus continuing observation/monitoring. Patient states that she had become extremely sick a few months ago and prefers to hold off treatment completely as long as possible, and states that she would consider going back on treatment if CEA starts rising or if scans show progression of disease. She will get labs drawn today including CBC, metabolic panel, CEA and a port flush. Will schedule her for followup in 6 weeks with repeat labs including CEA level, a port flush and see MD for continued surveillance and further treatment planning.   Patient Active Problem List   Diagnosis Date Noted  . Neutropenia, drug-induced 04/16/2012  . Intractable diarrhea 04/13/2012  . Diabetes type 2, uncontrolled 04/13/2012   . HTN (hypertension), benign 04/13/2012  . Thyroid condition   . Nausea 02/06/2012  . Colon adenocarcinoma 08/23/2010    Class: Diagnosis of    In between visits, patient was advised to call or come to ER in case of any new symptoms, unintentional weight loss, pain issues or acute sickness. Patient and family present are agreeable to this plan.  Rashema Seawright

## 2012-07-04 NOTE — Patient Instructions (Signed)
[facility] Discharge Instructions  RECOMMENDATIONS MADE BY THE CONSULTANT AND ANY TEST RESULTS WILL BE SENT TO YOUR REFERRING PHYSICIAN.  EXAM FINDINGS BY THE PHYSICIAN TODAY AND SIGNS OR SYMPTOMS TO REPORT TO CLINIC OR PRIMARY PHYSICIAN: Exam findings as discussed by Dr. Sherrlyn Hock.  SPECIAL INSTRUCTIONS/FOLLOW-UP: 1.  We drew labs today and and performed your port flush - we will repeat them again in 6 weeks to monitor the trend of your cancer marker. 2.  Please keep your MD appt with [place] in 6 weeks to review labs and discuss future care.  Thank you for choosing [facility] to provide your oncology and hematology care.  To afford each patient quality time with our providers, please arrive at least 15 minutes before your scheduled appointment time.  With your help, our goal is to use those 15 minutes to complete the necessary work-up to ensure our physicians have the information they need to help with your evaluation and healthcare recommendations.    Effective January 1st, 2014, we ask that you re-schedule your appointment with our physicians should you arrive 10 or more minutes late for your appointment.  We strive to give you quality time with our providers, and arriving late affects you and other patients whose appointments are after yours.    Again, thank you for choosing [facility].  Our hope is that these requests will decrease the amount of time that you wait before being seen by our physicians.       _____________________________________________________________  Should you have questions after your visit to [facility], please contact our office at [phone] between the hours of 8:30 a.m. and 5:00 p.m.  Voicemails left after 4:30 p.m. will not be returned until the following business day.  For prescription refill requests, have your pharmacy contact our office with your prescription refill request.

## 2012-07-04 NOTE — Progress Notes (Signed)
Harris N Claxton had Portacath access, lab draw, and flush.  Proper placement of portacath confirmed by CXR.  Portacath located left chest wall accessed with  H 20 needle.  Good blood return present. Portacath flushed with 20ml NS and 500U/62ml Heparin and needle removed intact.  Procedure tolerated well and without incident.

## 2012-07-05 LAB — CEA: CEA: 4.5 ng/mL (ref 0.0–5.0)

## 2012-07-15 ENCOUNTER — Other Ambulatory Visit (HOSPITAL_COMMUNITY): Payer: MEDICARE

## 2012-08-15 ENCOUNTER — Encounter (HOSPITAL_COMMUNITY): Payer: Medicare Other | Attending: Internal Medicine

## 2012-08-15 ENCOUNTER — Encounter (HOSPITAL_COMMUNITY): Payer: Self-pay

## 2012-08-15 DIAGNOSIS — C189 Malignant neoplasm of colon, unspecified: Secondary | ICD-10-CM | POA: Insufficient documentation

## 2012-08-15 DIAGNOSIS — C19 Malignant neoplasm of rectosigmoid junction: Secondary | ICD-10-CM

## 2012-08-15 DIAGNOSIS — Z9889 Other specified postprocedural states: Secondary | ICD-10-CM | POA: Insufficient documentation

## 2012-08-15 DIAGNOSIS — Z452 Encounter for adjustment and management of vascular access device: Secondary | ICD-10-CM

## 2012-08-15 DIAGNOSIS — Z95828 Presence of other vascular implants and grafts: Secondary | ICD-10-CM | POA: Insufficient documentation

## 2012-08-15 HISTORY — DX: Presence of other vascular implants and grafts: Z95.828

## 2012-08-15 LAB — CBC WITH DIFFERENTIAL/PLATELET
Eosinophils Absolute: 0.1 10*3/uL (ref 0.0–0.7)
Hemoglobin: 12.3 g/dL (ref 12.0–15.0)
Lymphs Abs: 2.4 10*3/uL (ref 0.7–4.0)
MCH: 30.8 pg (ref 26.0–34.0)
Monocytes Relative: 8 % (ref 3–12)
Neutrophils Relative %: 62 % (ref 43–77)
RBC: 3.99 MIL/uL (ref 3.87–5.11)

## 2012-08-15 LAB — COMPREHENSIVE METABOLIC PANEL
Alkaline Phosphatase: 57 U/L (ref 39–117)
BUN: 8 mg/dL (ref 6–23)
Chloride: 100 mEq/L (ref 96–112)
GFR calc Af Amer: >90 mL/min (ref >90)
GFR calc non Af Amer: 88 mL/min — ABNORMAL LOW (ref >90)
Glucose, Bld: 126 mg/dL — ABNORMAL HIGH (ref 70–99)
Potassium: 3.8 mEq/L (ref 3.5–5.1)
Total Bilirubin: 0.4 mg/dL (ref 0.3–1.2)

## 2012-08-15 MED ORDER — HEPARIN SOD (PORK) LOCK FLUSH 100 UNIT/ML IV SOLN
INTRAVENOUS | Status: AC
  Filled 2012-08-15: qty 5

## 2012-08-15 MED ORDER — HEPARIN SOD (PORK) LOCK FLUSH 100 UNIT/ML IV SOLN
500.0000 [IU] | Freq: Once | INTRAVENOUS | Status: AC
  Administered 2012-08-15: 500 [IU] via INTRAVENOUS
  Filled 2012-08-15: qty 5

## 2012-08-15 MED ORDER — SODIUM CHLORIDE 0.9 % IJ SOLN
10.0000 mL | INTRAMUSCULAR | Status: DC | PRN
  Administered 2012-08-15: 10 mL via INTRAVENOUS
  Filled 2012-08-15: qty 10

## 2012-08-15 NOTE — Progress Notes (Signed)
Port accessed and flushed per clinic protocol.  Tolerated well.

## 2012-08-16 ENCOUNTER — Ambulatory Visit (HOSPITAL_COMMUNITY): Payer: Medicare Other

## 2012-08-16 LAB — CEA: CEA: 20 ng/mL — ABNORMAL HIGH (ref 0.0–5.0)

## 2012-08-30 ENCOUNTER — Encounter (HOSPITAL_COMMUNITY): Payer: Self-pay | Admitting: Oncology

## 2012-08-30 ENCOUNTER — Encounter (HOSPITAL_BASED_OUTPATIENT_CLINIC_OR_DEPARTMENT_OTHER): Payer: Medicare Other | Admitting: Oncology

## 2012-08-30 VITALS — BP 133/73 | HR 71 | Temp 98.1°F | Resp 16 | Wt 167.0 lb

## 2012-08-30 DIAGNOSIS — C189 Malignant neoplasm of colon, unspecified: Secondary | ICD-10-CM

## 2012-08-30 DIAGNOSIS — R918 Other nonspecific abnormal finding of lung field: Secondary | ICD-10-CM

## 2012-08-30 DIAGNOSIS — C787 Secondary malignant neoplasm of liver and intrahepatic bile duct: Secondary | ICD-10-CM

## 2012-08-30 DIAGNOSIS — C19 Malignant neoplasm of rectosigmoid junction: Secondary | ICD-10-CM

## 2012-08-30 NOTE — Patient Instructions (Addendum)
[facility] Discharge Instructions  RECOMMENDATIONS MADE BY THE CONSULTANT AND ANY TEST RESULTS WILL BE SENT TO YOUR REFERRING PHYSICIAN.  EXAM FINDINGS BY THE PHYSICIAN TODAY AND SIGNS OR SYMPTOMS TO REPORT TO CLINIC OR PRIMARY PHYSICIAN: Discussion by Dellis Anes, PA-C. We will just watch you for now.  Will check your labs and see you back in 4 weeks.  MEDICATIONS PRESCRIBED:  none  INSTRUCTIONS GIVEN AND DISCUSSED: Report pain or other problems.  SPECIAL INSTRUCTIONS/FOLLOW-UP: See Schedule.  Thank you for choosing [facility] to provide your oncology and hematology care.  To afford each patient quality time with our providers, please arrive at least 15 minutes before your scheduled appointment time.  With your help, our goal is to use those 15 minutes to complete the necessary work-up to ensure our physicians have the information they need to help with your evaluation and healthcare recommendations.    Effective January 1st, 2014, we ask that you re-schedule your appointment with our physicians should you arrive 10 or more minutes late for your appointment.  We strive to give you quality time with our providers, and arriving late affects you and other patients whose appointments are after yours.    Again, thank you for choosing [facility].  Our hope is that these requests will decrease the amount of time that you wait before being seen by our physicians.       _____________________________________________________________  Should you have questions after your visit to [facility], please contact our office at [phone] between the hours of 8:30 a.m. and 5:00 p.m.  Voicemails left after 4:30 p.m. will not be returned until the following business day.  For prescription refill requests, have your pharmacy contact our office with your prescription refill request.

## 2012-08-30 NOTE — Progress Notes (Signed)
[name], MD [address] [place] [address]  Colon adenocarcinoma - Plan: levothyroxine (SYNTHROID, LEVOTHROID) 75 MCG tablet, CBC with Differential, Comprehensive metabolic panel, CEA  CURRENT THERAPY: Chemotherapy holiday/Surveillance  INTERVAL HISTORY: ADALYNA GODBEE 72 y.o. 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 She is status post RFA ablation initially of single liver lesion in August 2012 followed by 6 cycles of FOLFOX/bevacizumab. That was completed on 04/03/2011. She then had a rising CEA level with recurrent disease in the liver and probably in the lung with small non-hypermetabolic pulmonary nodules. Therefore, she was started on  FOLFIRI + Avastin from 01/16/2012- 04/08/2012 at which time her quality of life was declining and she requested a break from therapy.  Her CEA most recently went from 4.5 on 07/04/2012 to 20.0 on 08/15/2012.  When the result from her blood was reported, I personally discussed this result with the patient.  She reported at that time that she was feeling well and her quality of life was significantly improving.  We discussed options including continued observance or restaging scans.  The big issue is that she has to determine what she would liek to do with regards to treatment.  In other words, there is no need to scan her sooner than scheduled if she is not interested in undergoing chemotherapy if she has progression of disease.  Treatment is solely palliative and due to toxicities of treatment, it is questionable how well she will feel on treatment. She reported to me that she will consider her options over the next 10-14 days and we will reconvene as scheduled (today) to discuss options.   Today's goal of visit was to discuss the aforementioned results and goals of care.  She understands that her cancer is incurable, but treatable.  She  reports that she feels wonderful and is pleased that her quality of life has improved significantly.  She is completely asymptomatic from an oncology standpoint.  Her hair is growing back nicely.  She is ambulating well.  Her skin color is back to normal.  She is seen smiling and is pleasant as always today.  Our options are as follows:  1. Continue surveillance/observation  2. Prepare for restaging scans and if progression is noted, restart chemotherapy.   She has chosen option 1, which is absolutely reasonable.  She is not positive that she wishes to restart chemotherapy even if there is progression of disease.  Additionally, her most recent CEA increase is 1 time lab value without the establishment of a trend.  She is educated that we rarely make oncology decisions based off of one lab result.   She mentions the role of maintenance therapy.  This is a reasonable option if her CEA returns back to normal with next lab test, but if her CEA continues to climb and imaging studies reveal progressive disease, then a maintenance program is not indicated and high dose cytotoxic agents will be needed for treatment.  She was educated on the role of maintenance program.  She was educated that Avastin maintenance provides a median, progression free survival of 1.4 months.  Additionally, ~1/100 person will die from complications or side effects of Avastin.  Although Avastin is typically very well tolerated, it still needs to be respected with regards to administration and potential toxicities.   At the conclusion of our visit today, we have decided on the following future plan:  Will perform a port flush (as scheduled) in 4 weeks with a recheck on her CEA, in addition to other labs.  If the CEA is back down, will continue observation/surveillance/drug holiday.  If this is the case, we can re-visit the maintenance option, but I do not think maintenance therapy is worthwhile for Monice, but I will do whatever she  wishes.  If marker is increased >20, will need to re-visit goals of care and consider treatment options. She remains asymptomatic.   Past Medical History  Diagnosis Date  . Colon cancer     liver ca  . HTN (hypertension)   . Thyroid condition   . Diabetes mellitus   . Shingles   . Acute UTI   . Nausea 02/06/2012  . Port catheter in place 08/15/2012    has Colon adenocarcinoma; Nausea; Thyroid condition; Intractable diarrhea; Diabetes type 2, uncontrolled; HTN (hypertension), benign; Neutropenia, drug-induced; and Port catheter in place on her problem list.     has No Known Allergies.  Ms. Vannostrand had no medications administered during this visit.  Past Surgical History  Procedure Laterality Date  . Colectomy      and bil. oopherectomy  . Appendectomy    . Blt    . Resection liver partial / total w/ radiofrequency ablation    . Port-a-cath removal      has new port now  . Portacath placement  09/2010  . Oophorectomy Bilateral 2007    Denies any headaches, dizziness, double vision, fevers, chills, night sweats, nausea, vomiting, diarrhea, constipation, chest pain, heart palpitations, shortness of breath, blood in stool, black tarry stool, urinary pain, urinary burning, urinary frequency, hematuria.   PHYSICAL EXAMINATION  ECOG PERFORMANCE STATUS: 0 - Asymptomatic  Filed Vitals:   08/30/12 0900  BP: 133/73  Pulse: 71  Temp: 98.1 F (36.7 C)  Resp: 16    GENERAL:alert, no distress, well nourished, well developed, comfortable, cooperative and smiling SKIN: skin color, texture, turgor are normal, no rashes or significant lesions HEAD: Normocephalic, No masses, lesions, tenderness or abnormalities EYES: normal, PERRLA, EOMI, Conjunctiva are pink and non-injected EARS: External ears normal OROPHARYNX:mucous membranes are moist  NECK: supple, trachea midline LYMPH:  not examined BREAST:not examined LUNGS: not examined HEART: not examined ABDOMEN:not examined BACK: not  examined EXTREMITIES:no skin discoloration, no clubbing, no cyanosis  NEURO: alert & oriented x 3 with fluent speech, no focal motor/sensory deficits, gait normal   LABORATORY DATA: CBC    Component Value Date/Time   WBC 8.4 08/15/2012 0959   RBC 3.99 08/15/2012 0959   HGB 12.3 08/15/2012 0959   HCT 37.9 08/15/2012 0959   PLT 256 08/15/2012 0959   MCV 95.0 08/15/2012 0959   MCH 30.8 08/15/2012 0959   MCHC 32.5 08/15/2012 0959   RDW 13.2 08/15/2012 0959   LYMPHSABS 2.4 08/15/2012 0959   MONOABS 0.7 08/15/2012 0959   EOSABS 0.1 08/15/2012 0959   BASOSABS 0.0 08/15/2012 0959      Chemistry      Component Value Date/Time   NA 138 08/15/2012 0959   K 3.8 08/15/2012 0959   CL 100 08/15/2012 0959   CO2 28 08/15/2012 0959   BUN 8 08/15/2012 0959   CREATININE 0.64 08/15/2012 0959      Component Value Date/Time   CALCIUM 9.5 08/15/2012 0959   ALKPHOS 57 08/15/2012 0959   AST 14 08/15/2012 0959   ALT 12 08/15/2012 0959   BILITOT 0.4 08/15/2012 0959     Lab Results  Component  Value Date   CEA 20.0* 08/15/2012   Results for TAMILYN, LUPIEN (MRN [id number]) as of 08/30/2012 08:54  Ref. Range 07/04/2012 10:31  CEA Latest Range: 0.0-5.0 ng/mL 4.5      ASSESSMENT:  1. 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 She is status post RFA ablation initially of single liver lesion in August 2012 followed by 6 cycles of FOLFOX/bevacizumab. That was completed on 04/03/2011. She then had a rising CEA level with recurrent disease in the liver and probably in the lung with small non-hypermetabolic pulmonary nodules. Therefore, she was started on  FOLFIRI + Avastin from 01/16/2012- 04/08/2012 at which time her quality of life was declining and she requested a break from therapy.  Her CEA most recently went from 4.5 on 07/04/2012 to 20.0 on 08/15/2012.  Patient Active Problem List   Diagnosis Date Noted  . Port catheter in place 08/15/2012  . Neutropenia, drug-induced  04/16/2012  . Intractable diarrhea 04/13/2012  . Diabetes type 2, uncontrolled 04/13/2012  . HTN (hypertension), benign 04/13/2012  . Thyroid condition   . Nausea 02/06/2012  . Colon adenocarcinoma 08/23/2010    Class: Diagnosis of      PLAN:  1. I personally reviewed and went over laboratory results with the patient. 2. Labs in 4 weeks: CBC diff, CMET, CEA 3. Long discussion regarding options at this time.  4. Return in 4 weeks for follow-up.   THERAPY PLAN:  Will monitor labs with port flush.  She will be due in 4 weeks.  Will reconvene in 4 weeks following lab results.  At the conclusion of our visit today, we have decided on the following future plan:  Will perform a port flush (as scheduled) in 4 weeks with a recheck on her CEA, in addition to other labs.  If the CEA is back down, will continue observation/surveillance/drug holiday.  If this is the case, we can re-visit the maintenance therapy option, but I do not think maintenance therapy is worthwhile for Diannie, but I will do whatever she wishes.  If marker is increased >20, will need to re-visit goals of care and consider treatment options. She remains asymptomatic.  It would be reasonable to continue with surveillance/observation even if marker continues to climb as she is asymptomatic and quality of life is solid.  All questions were answered. The patient knows to call the clinic with any problems, questions or concerns. We can certainly see the patient much sooner if necessary.  Patient and plan discussed with Dr. Erline Hau and he is in agreement with the aforementioned.   KEFALAS,THOMAS

## 2012-09-26 ENCOUNTER — Other Ambulatory Visit (HOSPITAL_COMMUNITY): Payer: Medicare Other

## 2012-10-01 ENCOUNTER — Encounter (HOSPITAL_COMMUNITY): Payer: Medicare Other | Attending: Internal Medicine

## 2012-10-01 DIAGNOSIS — C189 Malignant neoplasm of colon, unspecified: Secondary | ICD-10-CM | POA: Insufficient documentation

## 2012-10-01 DIAGNOSIS — Z9889 Other specified postprocedural states: Secondary | ICD-10-CM | POA: Insufficient documentation

## 2012-10-01 DIAGNOSIS — Z452 Encounter for adjustment and management of vascular access device: Secondary | ICD-10-CM

## 2012-10-01 DIAGNOSIS — Z95828 Presence of other vascular implants and grafts: Secondary | ICD-10-CM

## 2012-10-01 DIAGNOSIS — C19 Malignant neoplasm of rectosigmoid junction: Secondary | ICD-10-CM

## 2012-10-01 LAB — CBC WITH DIFFERENTIAL/PLATELET
Basophils Absolute: 0 10*3/uL (ref 0.0–0.1)
Basophils Relative: 0 % (ref 0–1)
HCT: 36.5 % (ref 36.0–46.0)
Lymphocytes Relative: 33 % (ref 12–46)
Neutro Abs: 4.1 10*3/uL (ref 1.7–7.7)
Neutrophils Relative %: 58 % (ref 43–77)
Platelets: 278 10*3/uL (ref 150–400)
RDW: 13.6 % (ref 11.5–15.5)
WBC: 7 10*3/uL (ref 4.0–10.5)

## 2012-10-01 LAB — COMPREHENSIVE METABOLIC PANEL
ALT: 10 U/L (ref 0–35)
AST: 12 U/L (ref 0–37)
Albumin: 3.6 g/dL (ref 3.5–5.2)
CO2: 27 mEq/L (ref 19–32)
Chloride: 99 mEq/L (ref 96–112)
GFR calc non Af Amer: 88 mL/min — ABNORMAL LOW (ref >90)
Potassium: 3.7 mEq/L (ref 3.5–5.1)
Sodium: 137 mEq/L (ref 135–145)
Total Bilirubin: 0.4 mg/dL (ref 0.3–1.2)

## 2012-10-01 MED ORDER — SODIUM CHLORIDE 0.9 % IJ SOLN
10.0000 mL | INTRAMUSCULAR | Status: DC | PRN
  Administered 2012-10-01: 10 mL via INTRAVENOUS

## 2012-10-01 MED ORDER — HEPARIN SOD (PORK) LOCK FLUSH 100 UNIT/ML IV SOLN
500.0000 [IU] | Freq: Once | INTRAVENOUS | Status: AC
  Administered 2012-10-01: 500 [IU] via INTRAVENOUS
  Filled 2012-10-01: qty 5

## 2012-10-01 NOTE — Progress Notes (Signed)
Dionne N Licausi presented for Portacath access and flush. Proper placement of portacath confirmed by CXR. Portacath located left chest wall accessed with  H 20 needle. Good blood return present. Portacath flushed with 20ml NS and 500U/5ml Heparin and needle removed intact. Procedure without incident. Patient tolerated procedure well.   

## 2012-10-02 ENCOUNTER — Encounter (HOSPITAL_COMMUNITY): Payer: Medicare Other | Attending: Internal Medicine | Admitting: Oncology

## 2012-10-02 VITALS — BP 150/85 | HR 83 | Temp 97.8°F | Resp 16 | Wt 171.0 lb

## 2012-10-02 DIAGNOSIS — C787 Secondary malignant neoplasm of liver and intrahepatic bile duct: Secondary | ICD-10-CM

## 2012-10-02 DIAGNOSIS — C189 Malignant neoplasm of colon, unspecified: Secondary | ICD-10-CM

## 2012-10-02 DIAGNOSIS — R911 Solitary pulmonary nodule: Secondary | ICD-10-CM

## 2012-10-02 DIAGNOSIS — C19 Malignant neoplasm of rectosigmoid junction: Secondary | ICD-10-CM

## 2012-10-02 NOTE — Patient Instructions (Addendum)
[facility] Discharge Instructions  RECOMMENDATIONS MADE BY THE CONSULTANT AND ANY TEST RESULTS WILL BE SENT TO YOUR REFERRING PHYSICIAN.  EXAM FINDINGS BY THE PHYSICIAN TODAY AND SIGNS OR SYMPTOMS TO REPORT TO CLINIC OR PRIMARY PHYSICIAN: Exam and findings as discussed by Dellis Anes, PA- C.  No changes your CEA is stable.  Will continue checking labs with your port flushes every 6 weeks.  Report changes in bowel habits, blood in your bowel movement or other problems.  MEDICATIONS PRESCRIBED:  Benadryl 25 mg at bedtime and if you awaken before 3 am you can take an additional benadryl. If this doesn't work let us know.  INSTRUCTIONS/FOLLOW-UP: Labs with port flushes every 6 weeks and follow-up in 3 months.  Thank you for choosing [facility] to provide your oncology and hematology care.  To afford each patient quality time with our providers, please arrive at least 15 minutes before your scheduled appointment time.  With your help, our goal is to use those 15 minutes to complete the necessary work-up to ensure our physicians have the information they need to help with your evaluation and healthcare recommendations.    Effective January 1st, 2014, we ask that you re-schedule your appointment with our physicians should you arrive 10 or more minutes late for your appointment.  We strive to give you quality time with our providers, and arriving late affects you and other patients whose appointments are after yours.    Again, thank you for choosing [facility].  Our hope is that these requests will decrease the amount of time that you wait before being seen by our physicians.       _____________________________________________________________  Should you have questions after your visit to [facility], please contact our office at [phone] between the hours of 8:30 a.m. and 5:00 p.m.  Voicemails left after 4:30 p.m. will not be  returned until the following business day.  For prescription refill requests, have your pharmacy contact our office with your prescription refill request.

## 2012-10-02 NOTE — Progress Notes (Signed)
[name], MD [address] [place] [address]  Colon adenocarcinoma  CURRENT THERAPY:Chemotherapy holiday/Surveillance  INTERVAL HISTORY: [name] 72 y.o. 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 She is status post RFA ablation initially of single liver lesion in August 2012 followed by 6 cycles of FOLFOX/bevacizumab. That was completed on 04/03/2011. She then had a rising CEA level with recurrent disease in the liver and probably in the lung with small non-hypermetabolic pulmonary nodules. Therefore, she was started on FOLFIRI + Avastin from 01/16/2012- 04/08/2012 at which time her quality of life was declining and she requested a break from therapy. Her CEA recently went from 4.5 on 07/04/2012 to 20.0 on 08/15/2012, but most recently stabilized at 16.2 on 10/01/2012.  Her only complaint is difficulty sleeping throughout the night.  She falls asleep around 10-11 PM but then awakes around 1-2 AM.  She admits that she feels rested, but would like to sleep throughout the night.  As a result, we discussed sleeping medications.  I focused on OTC medications and recommended Benadryl 25-50 mg at HS initially since she already has this medication at home.  She may take an additional 25 mg of Benadryl if she wakes up early in the AM if she took only 25 mg at HS initially. She will report back to [place] by Friday or Monday if this approach fails to help her sleep throughout the night.  If it does fail, I would consider Restoril at HS.   I personally reviewed and went over laboratory results with the patient. Her CEA is stable at 16.2, compared to 20 4 weeks ago.  Her other lab work is unremarkable with a CBC WNL and a very stable metabolic panel without any obvious abnormalities.    She reports that she feels great.  Clinically, this is one the best times I've seen her look.  Her energy  is strong and appetite is good.   We again broached the subject of maintenance chemotherapy and myself and the patient feel that this is not a good option for her with only marginal benefit in overall survival.   We will continue with drug Holiday.  We will perform labs with her port flushes every 6 weeks and see her back in 12 weeks.  We can always change this plan moving forward.   Oncologically, she denies any complaints and ROS questioning is negative.     Past Medical History  Diagnosis Date  . Colon cancer     liver ca  . HTN (hypertension)   . Thyroid condition   . Diabetes mellitus   . Shingles   . Acute UTI   . Nausea 02/06/2012  . Port catheter in place 08/15/2012    has Colon adenocarcinoma; Nausea; Thyroid condition; Intractable diarrhea; Diabetes type 2, uncontrolled; HTN (hypertension), benign; Neutropenia, drug-induced; and Port catheter in place on her problem list.     has No Known Allergies.  [name] does not currently have medications on file.  Past Surgical History  Procedure Laterality Date  . Colectomy      and bil. oopherectomy  . Appendectomy    . Blt    . Resection liver partial / total w/ radiofrequency ablation    . Port-a-cath removal      has new port now  . Portacath placement  09/2010  . Oophorectomy Bilateral 2007    Denies  any headaches, dizziness, double vision, fevers, chills, night sweats, nausea, vomiting, diarrhea, constipation, chest pain, heart palpitations, shortness of breath, blood in stool, black tarry stool, urinary pain, urinary burning, urinary frequency, hematuria.   PHYSICAL EXAMINATION  ECOG PERFORMANCE STATUS: 0 - Asymptomatic  Filed Vitals:   10/02/12 1057  BP: 150/85  Pulse: 83  Temp: 97.8 F (36.6 C)  Resp: 16    GENERAL:alert, no distress, well nourished, well developed, comfortable, cooperative and smiling SKIN: skin color, texture, turgor are normal, no rashes or significant lesions HEAD: Normocephalic, No  masses, lesions, tenderness or abnormalities EYES: normal, PERRLA, EOMI, Conjunctiva are pink and non-injected EARS: External ears normal OROPHARYNX:mucous membranes are moist  NECK: supple, no adenopathy, thyroid normal size, non-tender, without nodularity, no stridor, non-tender, trachea midline LYMPH:  not examined BREAST:not examined LUNGS: not examined HEART: not examined ABDOMEN:normal bowel sounds BACK: Back symmetric, no curvature. EXTREMITIES:no skin discoloration, no clubbing, no cyanosis  NEURO: alert & oriented x 3 with fluent speech, no focal motor/sensory deficits, gait normal   LABORATORY DATA: CBC    Component Value Date/Time   WBC 7.0 10/01/2012 1320   RBC 3.91 10/01/2012 1320   HGB 12.1 10/01/2012 1320   HCT 36.5 10/01/2012 1320   PLT 278 10/01/2012 1320   MCV 93.4 10/01/2012 1320   MCH 30.9 10/01/2012 1320   MCHC 33.2 10/01/2012 1320   RDW 13.6 10/01/2012 1320   LYMPHSABS 2.3 10/01/2012 1320   MONOABS 0.5 10/01/2012 1320   EOSABS 0.1 10/01/2012 1320   BASOSABS 0.0 10/01/2012 1320      Chemistry      Component Value Date/Time   NA 137 10/01/2012 1320   K 3.7 10/01/2012 1320   CL 99 10/01/2012 1320   CO2 27 10/01/2012 1320   BUN 11 10/01/2012 1320   CREATININE 0.64 10/01/2012 1320      Component Value Date/Time   CALCIUM 9.6 10/01/2012 1320   ALKPHOS 57 10/01/2012 1320   AST 12 10/01/2012 1320   ALT 10 10/01/2012 1320   BILITOT 0.4 10/01/2012 1320     Lab Results  Component Value Date   CEA 16.2* 10/01/2012      ASSESSMENT:  1. 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 She is status post RFA ablation initially of single liver lesion in August 2012 followed by 6 cycles of FOLFOX/bevacizumab. That was completed on 04/03/2011. She then had a rising CEA level with recurrent disease in the liver and probably in the lung with small non-hypermetabolic pulmonary nodules. Therefore, she was started on FOLFIRI + Avastin  from 01/16/2012- 04/08/2012 at which time her quality of life was declining and she requested a break from therapy. Her CEA recently went from 4.5 on 07/04/2012 to 20.0 on 08/15/2012, but most recently stabilized at 16.2 on 10/01/2012.  Will continue drug holiday at this time. 2. Difficulty sleeping.  Patient Active Problem List   Diagnosis Date Noted  . Port catheter in place 08/15/2012  . Neutropenia, drug-induced 04/16/2012  . Intractable diarrhea 04/13/2012  . Diabetes type 2, uncontrolled 04/13/2012  . HTN (hypertension), benign 04/13/2012  . Thyroid condition   . Nausea 02/06/2012  . Colon adenocarcinoma 08/23/2010    Class: Diagnosis of    PLAN:  1. I personally reviewed and went over laboratory results with the patient. 2. Labs in 6 weeks and 12 weeks: CBC diff, CMET, CEA 3. Recommend Benadryl 25-50 mg at HS 4. If #3 is ineffective, I would  consider Restoril therapy for insomnia. 5. Continue drug Holiday. 6. Return in 12 weeks for follow-up.   THERAPY PLAN:  I again broached the subject of maintenance therapy for her metastatic colon cancer and myself and the patient feel that this is not a good option for her with only marginal benefit in overall survival.  Thus, we will continue with drug holiday and continue to follow her clinically and with serial CEAs.  If stable, would recommend continued drug holiday.  If CEA increases, will need to discuss options with patient which includes restaging scans if she is interested in further treatment.    All questions were answered. The patient knows to call the clinic with any problems, questions or concerns. We can certainly see the patient much sooner if necessary.  Patient and plan discussed with Dr. Alla German and he is in agreement with the aforementioned.   More than 50% of the time spent with the patient was utilized for counseling and coordination of care.   Eldor Conaway

## 2012-11-12 ENCOUNTER — Encounter (HOSPITAL_COMMUNITY): Payer: Medicare Other | Attending: Internal Medicine

## 2012-11-12 DIAGNOSIS — Z9889 Other specified postprocedural states: Secondary | ICD-10-CM | POA: Insufficient documentation

## 2012-11-12 DIAGNOSIS — Z452 Encounter for adjustment and management of vascular access device: Secondary | ICD-10-CM

## 2012-11-12 DIAGNOSIS — Z95828 Presence of other vascular implants and grafts: Secondary | ICD-10-CM

## 2012-11-12 DIAGNOSIS — C189 Malignant neoplasm of colon, unspecified: Secondary | ICD-10-CM | POA: Insufficient documentation

## 2012-11-12 LAB — CBC WITH DIFFERENTIAL/PLATELET
Eosinophils Absolute: 0.1 10*3/uL (ref 0.0–0.7)
Eosinophils Relative: 2 % (ref 0–5)
HCT: 37.2 % (ref 36.0–46.0)
Hemoglobin: 12.4 g/dL (ref 12.0–15.0)
Lymphocytes Relative: 30 % (ref 12–46)
Lymphs Abs: 2 10*3/uL (ref 0.7–4.0)
MCH: 31.3 pg (ref 26.0–34.0)
MCHC: 33.3 g/dL (ref 30.0–36.0)
MCV: 93.9 fL (ref 78.0–100.0)
Monocytes Absolute: 0.5 10*3/uL (ref 0.1–1.0)
Platelets: 256 10*3/uL (ref 150–400)
RDW: 13.6 % (ref 11.5–15.5)
WBC: 6.6 10*3/uL (ref 4.0–10.5)

## 2012-11-12 LAB — COMPREHENSIVE METABOLIC PANEL
ALT: 12 U/L (ref 0–35)
Calcium: 9.5 mg/dL (ref 8.4–10.5)
GFR calc Af Amer: >90 mL/min (ref >90)
Glucose, Bld: 169 mg/dL — ABNORMAL HIGH (ref 70–99)
Sodium: 138 mEq/L (ref 135–145)
Total Protein: 7 g/dL (ref 6.0–8.3)

## 2012-11-12 MED ORDER — HEPARIN SOD (PORK) LOCK FLUSH 100 UNIT/ML IV SOLN
500.0000 [IU] | Freq: Once | INTRAVENOUS | Status: AC
  Administered 2012-11-12: 500 [IU] via INTRAVENOUS
  Filled 2012-11-12: qty 5

## 2012-11-12 MED ORDER — SODIUM CHLORIDE 0.9 % IJ SOLN
10.0000 mL | INTRAMUSCULAR | Status: DC | PRN
  Administered 2012-11-12: 10 mL via INTRAVENOUS

## 2012-11-12 NOTE — Progress Notes (Signed)
[name] presented for Portacath access and flush. Proper placement of portacath confirmed by CXR. Portacath located left chest wall accessed with  H 20 needle. Good blood return present. Portacath flushed with 20ml NS and 500U/5ml Heparin and needle removed intact. Procedure without incident. Patient tolerated procedure well.   

## 2012-11-13 ENCOUNTER — Encounter (HOSPITAL_COMMUNITY): Payer: Medicare Other

## 2012-11-13 LAB — CEA: CEA: 15.7 ng/mL — ABNORMAL HIGH (ref 0.0–5.0)

## 2012-12-24 ENCOUNTER — Encounter (HOSPITAL_COMMUNITY): Payer: Medicare Other

## 2012-12-24 ENCOUNTER — Encounter (HOSPITAL_COMMUNITY): Payer: Medicare Other | Attending: Internal Medicine

## 2012-12-24 DIAGNOSIS — C19 Malignant neoplasm of rectosigmoid junction: Secondary | ICD-10-CM

## 2012-12-24 DIAGNOSIS — Z9889 Other specified postprocedural states: Secondary | ICD-10-CM | POA: Insufficient documentation

## 2012-12-24 DIAGNOSIS — C189 Malignant neoplasm of colon, unspecified: Secondary | ICD-10-CM | POA: Diagnosis present

## 2012-12-24 DIAGNOSIS — Z452 Encounter for adjustment and management of vascular access device: Secondary | ICD-10-CM

## 2012-12-24 DIAGNOSIS — Z95828 Presence of other vascular implants and grafts: Secondary | ICD-10-CM

## 2012-12-24 LAB — CBC WITH DIFFERENTIAL/PLATELET
Basophils Relative: 1 % (ref 0–1)
Eosinophils Relative: 2 % (ref 0–5)
HCT: 37.6 % (ref 36.0–46.0)
Hemoglobin: 12.3 g/dL (ref 12.0–15.0)
Lymphocytes Relative: 29 % (ref 12–46)
Lymphs Abs: 2 10*3/uL (ref 0.7–4.0)
MCH: 31.1 pg (ref 26.0–34.0)
MCHC: 32.7 g/dL (ref 30.0–36.0)
MCV: 94.9 fL (ref 78.0–100.0)
Monocytes Relative: 6 % (ref 3–12)
Neutro Abs: 4.2 10*3/uL (ref 1.7–7.7)
Neutrophils Relative %: 62 % (ref 43–77)
RBC: 3.96 MIL/uL (ref 3.87–5.11)

## 2012-12-24 LAB — COMPREHENSIVE METABOLIC PANEL
Alkaline Phosphatase: 56 U/L (ref 39–117)
BUN: 14 mg/dL (ref 6–23)
CO2: 27 mEq/L (ref 19–32)
Chloride: 98 mEq/L (ref 96–112)
Creatinine, Ser: 0.75 mg/dL (ref 0.50–1.10)
GFR calc Af Amer: >90 mL/min (ref >90)
Glucose, Bld: 161 mg/dL — ABNORMAL HIGH (ref 70–99)
Potassium: 3.9 mEq/L (ref 3.5–5.1)
Total Bilirubin: 0.3 mg/dL (ref 0.3–1.2)

## 2012-12-24 MED ORDER — HEPARIN SOD (PORK) LOCK FLUSH 100 UNIT/ML IV SOLN
500.0000 [IU] | Freq: Once | INTRAVENOUS | Status: AC
  Administered 2012-12-24: 500 [IU] via INTRAVENOUS

## 2012-12-24 MED ORDER — HEPARIN SOD (PORK) LOCK FLUSH 100 UNIT/ML IV SOLN
INTRAVENOUS | Status: AC
  Filled 2012-12-24: qty 5

## 2012-12-24 MED ORDER — SODIUM CHLORIDE 0.9 % IJ SOLN
10.0000 mL | INTRAMUSCULAR | Status: DC | PRN
  Administered 2012-12-24: 10 mL via INTRAVENOUS

## 2012-12-24 NOTE — Progress Notes (Signed)
[name] presented for Portacath access and flush. Proper placement of portacath confirmed by CXR. Portacath located left chest wall accessed with  H 20 needle. Good blood return present. Portacath flushed with 20ml NS and 500U/5ml Heparin and needle removed intact. Procedure without incident. Patient tolerated procedure well.   

## 2012-12-25 LAB — CEA: CEA: 17.9 ng/mL — ABNORMAL HIGH (ref 0.0–5.0)

## 2013-01-05 NOTE — Progress Notes (Signed)
[name], MD [address] [place] [address]  Colon adenocarcinoma - Plan: CBC with Differential, Comprehensive metabolic panel, CEA  Insomnia - Plan: LORazepam (ATIVAN) 0.5 MG tablet  CURRENT THERAPY: Chemotherapy holiday/Surveillance   INTERVAL HISTORY: [name] 73 y.o. 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 She is status post RFA ablation initially of single liver lesion in August 2012 followed by 6 cycles of FOLFOX/bevacizumab. That was completed on 04/03/2011. She then had a rising CEA level with recurrent disease in the liver and probably in the lung with small non-hypermetabolic pulmonary nodules. Therefore, she was started on FOLFIRI + Avastin from 01/16/2012- 04/08/2012 at which time her quality of life was declining and she requested a break from therapy. Her CEA recently went from 4.5 on 07/04/2012 to 20.0 on 08/15/2012, but most recently stabilized at 17.9 on 12/24/2012.  I personally reviewed and went over laboratory results with the patient.  Her labs are very stable with a CBC WNL and complete metabolic panel only impressive for hyperglycemia at 161 (but these labs were not fasting labs and therefore, glucose level of 161 is unimpressive).  CEA is stable at 17.9.   We again broached the subject of maintenance chemotherapy and myself and the patient feel that this is not a good option for her with only marginal benefit in overall survival of only about 1.3 months.    She reports that she is still having issues with insomnia.  She has tried OTC Benadryl without benefit.  Therefore, we will try Ativan 0.5- 1.0 mg at HS as she has taken this medication in the past for nausea control and was effective at making her drowsy.  A Rx was written and provided to her.   We will continue with drug Holiday. We will perform labs with her port flushes every 6 weeks  and see her back in 12 weeks. We can always change this plan moving forward.  Oncologically, she denies any complaints. She feels wonderful and her quality of life is great.  Oncologic ROS questioning is negative.   Past Medical History  Diagnosis Date  . Colon cancer     liver ca  . HTN (hypertension)   . Thyroid condition   . Diabetes mellitus   . Shingles   . Acute UTI   . Nausea 02/06/2012  . Port catheter in place 08/15/2012    has Colon adenocarcinoma; Thyroid condition; Diabetes type 2, uncontrolled; HTN (hypertension), benign; and Port catheter in place on her problem list.     has No Known Allergies.  Ms. Gellner does not currently have medications on file.  Past Surgical History  Procedure Laterality Date  . Colectomy      and bil. oopherectomy  . Appendectomy    . Blt    . Resection liver partial / total w/ radiofrequency ablation    . Port-a-cath removal      has new port now  . Portacath placement  09/2010  . Oophorectomy Bilateral 2007    Denies any headaches, dizziness, double vision, fevers, chills, night sweats, nausea, vomiting, diarrhea, constipation, chest pain, heart palpitations, shortness of breath, blood in stool, black tarry stool, urinary pain, urinary burning, urinary frequency, hematuria.   PHYSICAL EXAMINATION  ECOG PERFORMANCE STATUS: 0 - Asymptomatic  Filed Vitals:   01/06/13 0908  BP: 157/81  Pulse: 61  Temp: 97.6 F (36.4 C)  Resp: 18    GENERAL:alert, no distress, well nourished, well developed, comfortable, cooperative and smiling SKIN: skin color, texture, turgor are normal, no rashes or significant lesions HEAD: Normocephalic, No masses, lesions, tenderness or abnormalities EYES: normal, PERRLA, EOMI, Conjunctiva are pink and non-injected EARS: External ears normal OROPHARYNX:mucous membranes are moist  NECK: supple, no adenopathy, trachea midline LYMPH:  no palpable lymphadenopathy BREAST:not examined LUNGS: clear to  auscultation  HEART: regular rate & rhythm, no murmurs, no gallops, S1 normal and S2 normal ABDOMEN:abdomen soft, non-tender, normal bowel sounds and liver edge palpated 1-2 CM below the costophrenic margin at mid-clavicular line on deep inspiration, non tender. BACK: Back symmetric, no curvature., left parathoracic mass noted without erythema, heat or pain.  It is soft and mobile measuring about 1 CM EXTREMITIES:less then 2 second capillary refill, no joint deformities, effusion, or inflammation, no edema, no skin discoloration, no clubbing, no cyanosis  NEURO: alert & oriented x 3 with fluent speech, no focal motor/sensory deficits, gait normal    LABORATORY DATA: CBC    Component Value Date/Time   WBC 6.8 12/24/2012 1046   RBC 3.96 12/24/2012 1046   HGB 12.3 12/24/2012 1046   HCT 37.6 12/24/2012 1046   PLT 276 12/24/2012 1046   MCV 94.9 12/24/2012 1046   MCH 31.1 12/24/2012 1046   MCHC 32.7 12/24/2012 1046   RDW 13.2 12/24/2012 1046   LYMPHSABS 2.0 12/24/2012 1046   MONOABS 0.4 12/24/2012 1046   EOSABS 0.2 12/24/2012 1046   BASOSABS 0.0 12/24/2012 1046      Chemistry      Component Value Date/Time   NA 136 12/24/2012 1046   K 3.9 12/24/2012 1046   CL 98 12/24/2012 1046   CO2 27 12/24/2012 1046   BUN 14 12/24/2012 1046   CREATININE 0.75 12/24/2012 1046      Component Value Date/Time   CALCIUM 9.2 12/24/2012 1046   ALKPHOS 56 12/24/2012 1046   AST 13 12/24/2012 1046   ALT 10 12/24/2012 1046   BILITOT 0.3 12/24/2012 1046     Lab Results  Component Value Date   CEA 17.9* 12/24/2012   Results for IYONA, PEHRSON (MRN [id number]) as of 01/05/2013 15:48  Ref. Range 08/15/2012 09:59 10/01/2012 13:20 11/12/2012 11:26 12/24/2012 10:46  CEA Latest Range: 0.0-5.0 ng/mL 20.0 (H) 16.2 (H) 15.7 (H) 17.9 (H)      ASSESSMENT:  1. 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 She is status post RFA ablation initially of single  liver lesion in August 2012 followed by 6 cycles of FOLFOX/bevacizumab. That was completed on 04/03/2011. She then had a rising CEA level with recurrent disease in the liver and probably in the lung with small non-hypermetabolic pulmonary nodules. Therefore, she was started on FOLFIRI + Avastin from 01/16/2012- 04/08/2012 at which time her quality of life was declining and she requested a break from therapy. Her CEA recently went from 4.5 on 07/04/2012 to 20.0 on 08/15/2012, but most recently stabilized at 17.9 on 12/24/2012. Will continue drug holiday at this time.  2. Difficulty sleeping. 3. Liver palpated, see physical exam for details 4. 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  1 CM in diameter.  Patient Active Problem List   Diagnosis Date Noted  . Port catheter in place 08/15/2012  . Diabetes type 2, uncontrolled 04/13/2012  . HTN (hypertension), benign 04/13/2012  . Thyroid condition   . Colon adenocarcinoma 08/23/2010    Class: Diagnosis  of      PLAN:  1. I personally reviewed and went over laboratory results with the patient.  2. Labs in 6 weeks and 12 weeks with port flushes: CBC diff, CMET, CEA  3. Rx for Ativan 0.5 - 1.0 mg at HS #60 with 2 refills.  4. Patient education regarding Ativan, the risks, benefits, and alternatives. 5. Continue drug Holiday.  6. Problem list updated 7. Return in 12 weeks for follow-up.    THERAPY PLAN:  I again broached the subject of maintenance therapy for her metastatic colon cancer and myself and the patient feel that this is not a good option for her with only marginal benefit in overall survival. Thus, we will continue with drug holiday and continue to follow her clinically and with serial CEAs. If stable, would recommend continued drug holiday. If CEA increases or she becomes symptomatic, we will need to discuss options with patient which includes restaging scans if she is interested in further treatment (but she is not  interested in treatment at this time).   All questions were answered. The patient knows to call the clinic with any problems, questions or concerns. We can certainly see the patient much sooner if necessary.  Patient and plan discussed with Dr. Farrel Gobble and he is in agreement with the aforementioned.    KEFALAS,THOMAS

## 2013-01-06 ENCOUNTER — Encounter (HOSPITAL_COMMUNITY): Payer: Medicare Other | Attending: Internal Medicine | Admitting: Oncology

## 2013-01-06 VITALS — BP 157/81 | HR 61 | Temp 97.6°F | Resp 18 | Wt 178.6 lb

## 2013-01-06 DIAGNOSIS — C19 Malignant neoplasm of rectosigmoid junction: Secondary | ICD-10-CM

## 2013-01-06 DIAGNOSIS — C189 Malignant neoplasm of colon, unspecified: Secondary | ICD-10-CM

## 2013-01-06 DIAGNOSIS — G4700 Insomnia, unspecified: Secondary | ICD-10-CM | POA: Insufficient documentation

## 2013-01-06 MED ORDER — LORAZEPAM 0.5 MG PO TABS: 1.0000 mg | ORAL_TABLET | Freq: Every day | ORAL | Status: DC

## 2013-01-06 NOTE — Patient Instructions (Signed)
.  [facility] Discharge Instructions  RECOMMENDATIONS MADE BY THE CONSULTANT AND ANY TEST RESULTS WILL BE SENT TO YOUR REFERRING PHYSICIAN.  EXAM FINDINGS BY THE PHYSICIAN TODAY AND SIGNS OR SYMPTOMS TO REPORT TO CLINIC OR PRIMARY PHYSICIAN: Exam and findings as discussed by T. Sheldon Silvan PA.Marland Kitchen  MEDICATIONS PRESCRIBED:  Ativan for sleep  INSTRUCTIONS/FOLLOW-UP: 3 months after port flush  Thank you for choosing [facility] to provide your oncology and hematology care.  To afford each patient quality time with our providers, please arrive at least 15 minutes before your scheduled appointment time.  With your help, our goal is to use those 15 minutes to complete the necessary work-up to ensure our physicians have the information they need to help with your evaluation and healthcare recommendations.    Effective January 1st, 2014, we ask that you re-schedule your appointment with our physicians should you arrive 10 or more minutes late for your appointment.  We strive to give you quality time with our providers, and arriving late affects you and other patients whose appointments are after yours.    Again, thank you for choosing [facility].  Our hope is that these requests will decrease the amount of time that you wait before being seen by our physicians.       _____________________________________________________________  Should you have questions after your visit to [facility], please contact our office at [phone] between the hours of 8:30 a.m. and 5:00 p.m.  Voicemails left after 4:30 p.m. will not be returned until the following business day.  For prescription refill requests, have your pharmacy contact our office with your prescription refill request.

## 2013-01-10 HISTORY — PX: OTHER SURGICAL HISTORY: SHX169

## 2013-01-24 HISTORY — PX: OTHER SURGICAL HISTORY: SHX169

## 2013-02-04 ENCOUNTER — Encounter (HOSPITAL_COMMUNITY): Payer: Medicare Other | Attending: Internal Medicine

## 2013-02-04 DIAGNOSIS — C189 Malignant neoplasm of colon, unspecified: Secondary | ICD-10-CM

## 2013-02-04 DIAGNOSIS — C19 Malignant neoplasm of rectosigmoid junction: Secondary | ICD-10-CM

## 2013-02-04 LAB — CBC WITH DIFFERENTIAL/PLATELET
Basophils Absolute: 0 10*3/uL (ref 0.0–0.1)
Basophils Relative: 1 % (ref 0–1)
EOS PCT: 2 % (ref 0–5)
Eosinophils Absolute: 0.2 10*3/uL (ref 0.0–0.7)
HCT: 39.4 % (ref 36.0–46.0)
Hemoglobin: 13 g/dL (ref 12.0–15.0)
LYMPHS PCT: 26 % (ref 12–46)
Lymphs Abs: 2 10*3/uL (ref 0.7–4.0)
MCH: 31.5 pg (ref 26.0–34.0)
MCHC: 33 g/dL (ref 30.0–36.0)
MCV: 95.4 fL (ref 78.0–100.0)
Monocytes Absolute: 0.5 10*3/uL (ref 0.1–1.0)
Monocytes Relative: 7 % (ref 3–12)
NEUTROS ABS: 4.9 10*3/uL (ref 1.7–7.7)
Neutrophils Relative %: 64 % (ref 43–77)
PLATELETS: 300 10*3/uL (ref 150–400)
RBC: 4.13 MIL/uL (ref 3.87–5.11)
RDW: 13.4 % (ref 11.5–15.5)
WBC: 7.6 10*3/uL (ref 4.0–10.5)

## 2013-02-04 LAB — COMPREHENSIVE METABOLIC PANEL
ALK PHOS: 64 U/L (ref 39–117)
ALT: 12 U/L (ref 0–35)
AST: 14 U/L (ref 0–37)
Albumin: 3.8 g/dL (ref 3.5–5.2)
BILIRUBIN TOTAL: 0.4 mg/dL (ref 0.3–1.2)
BUN: 10 mg/dL (ref 6–23)
CHLORIDE: 99 meq/L (ref 96–112)
CO2: 25 meq/L (ref 19–32)
Calcium: 9.4 mg/dL (ref 8.4–10.5)
Creatinine, Ser: 0.68 mg/dL (ref 0.50–1.10)
GFR calc non Af Amer: 86 mL/min — ABNORMAL LOW (ref >90)
GLUCOSE: 127 mg/dL — AB (ref 70–99)
Potassium: 4.1 mEq/L (ref 3.7–5.3)
SODIUM: 140 meq/L (ref 137–147)
Total Protein: 7.3 g/dL (ref 6.0–8.3)

## 2013-02-04 MED ORDER — SODIUM CHLORIDE 0.9 % IJ SOLN
10.0000 mL | Freq: Once | INTRAMUSCULAR | Status: AC
  Administered 2013-02-04: 10 mL via INTRAVENOUS

## 2013-02-04 MED ORDER — HEPARIN SOD (PORK) LOCK FLUSH 100 UNIT/ML IV SOLN
500.0000 [IU] | Freq: Once | INTRAVENOUS | Status: AC
  Administered 2013-02-04: 500 [IU] via INTRAVENOUS

## 2013-02-04 MED ORDER — HEPARIN SOD (PORK) LOCK FLUSH 100 UNIT/ML IV SOLN
INTRAVENOUS | Status: AC
  Filled 2013-02-04: qty 5

## 2013-02-04 NOTE — Progress Notes (Signed)
[name] presented for Portacath access and flush.   Portacath located left chest wall accessed with  H 20 needle.  Good blood return present. Portacath flushed with 49ml NS and 500U/34ml Heparin and needle removed intact.  Procedure tolerated well and without incident.

## 2013-02-05 LAB — CEA: CEA: 29 ng/mL — AB (ref 0.0–5.0)

## 2013-03-18 ENCOUNTER — Encounter (HOSPITAL_COMMUNITY): Payer: Medicare Other | Attending: Internal Medicine

## 2013-03-18 DIAGNOSIS — C189 Malignant neoplasm of colon, unspecified: Secondary | ICD-10-CM | POA: Insufficient documentation

## 2013-03-18 DIAGNOSIS — Z452 Encounter for adjustment and management of vascular access device: Secondary | ICD-10-CM

## 2013-03-18 LAB — COMPREHENSIVE METABOLIC PANEL
ALBUMIN: 3.7 g/dL (ref 3.5–5.2)
ALT: 13 U/L (ref 0–35)
AST: 16 U/L (ref 0–37)
Alkaline Phosphatase: 57 U/L (ref 39–117)
BUN: 10 mg/dL (ref 6–23)
CALCIUM: 9.3 mg/dL (ref 8.4–10.5)
CO2: 27 mEq/L (ref 19–32)
CREATININE: 0.66 mg/dL (ref 0.50–1.10)
Chloride: 101 mEq/L (ref 96–112)
GFR calc Af Amer: >90 mL/min (ref >90)
GFR calc non Af Amer: 87 mL/min — ABNORMAL LOW (ref >90)
Glucose, Bld: 109 mg/dL — ABNORMAL HIGH (ref 70–99)
Potassium: 4 mEq/L (ref 3.7–5.3)
Sodium: 141 mEq/L (ref 137–147)
TOTAL PROTEIN: 7 g/dL (ref 6.0–8.3)
Total Bilirubin: 0.3 mg/dL (ref 0.3–1.2)

## 2013-03-18 LAB — CBC WITH DIFFERENTIAL/PLATELET
BASOS ABS: 0 10*3/uL (ref 0.0–0.1)
BASOS PCT: 1 % (ref 0–1)
EOS PCT: 2 % (ref 0–5)
Eosinophils Absolute: 0.2 10*3/uL (ref 0.0–0.7)
HEMATOCRIT: 37.6 % (ref 36.0–46.0)
Hemoglobin: 12.4 g/dL (ref 12.0–15.0)
LYMPHS PCT: 28 % (ref 12–46)
Lymphs Abs: 2.1 10*3/uL (ref 0.7–4.0)
MCH: 31.8 pg (ref 26.0–34.0)
MCHC: 33 g/dL (ref 30.0–36.0)
MCV: 96.4 fL (ref 78.0–100.0)
MONO ABS: 0.6 10*3/uL (ref 0.1–1.0)
Monocytes Relative: 8 % (ref 3–12)
Neutro Abs: 4.7 10*3/uL (ref 1.7–7.7)
Neutrophils Relative %: 62 % (ref 43–77)
PLATELETS: 291 10*3/uL (ref 150–400)
RBC: 3.9 MIL/uL (ref 3.87–5.11)
RDW: 13.4 % (ref 11.5–15.5)
WBC: 7.6 10*3/uL (ref 4.0–10.5)

## 2013-03-18 MED ORDER — HEPARIN SOD (PORK) LOCK FLUSH 100 UNIT/ML IV SOLN
500.0000 [IU] | Freq: Once | INTRAVENOUS | Status: DC
  Filled 2013-03-18: qty 5

## 2013-03-18 MED ORDER — SODIUM CHLORIDE 0.9 % IJ SOLN
10.0000 mL | INTRAMUSCULAR | Status: DC | PRN
  Administered 2013-03-18: 10 mL via INTRAVENOUS

## 2013-03-18 NOTE — Progress Notes (Signed)
[name] presented for Portacath access and flush. Portacath located rt chest wall accessed with  H 20 needle. Good blood return present. Portacath flushed with 71ml NS and 500U/39ml Heparin and needle removed intact. Procedure without incident. Patient tolerated procedure well.

## 2013-03-19 LAB — CEA: CEA: 38 ng/mL — ABNORMAL HIGH (ref 0.0–5.0)

## 2013-03-28 NOTE — Progress Notes (Signed)
[name], MD [place] [address]  Colon adenocarcinoma  CURRENT THERAPY:Chemotherapy holiday/Surveillance  INTERVAL HISTORY: [name] 73 y.o. 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 She is status post RFA ablation initially of single liver lesion in August 2012 followed by 6 cycles of FOLFOX/bevacizumab. That was completed on 04/03/2011. She then had a rising CEA level with recurrent disease in the liver and probably in the lung with small non-hypermetabolic pulmonary nodules. Therefore, she was started on FOLFIRI + Avastin from 01/16/2012- 04/08/2012 at which time her quality of life was declining and she requested a break from therapy. We have been monitoring her CEA with every port flush.    I personally reviewed and went over laboratory results with the patient.  The results are noted within this dictation.  Most recent CEA was 38.  Otherwise her lab are wonderful as dictated below.  I provided her education regarding CEA.  CEA's do not correlate with tumor burden and therefore are not predictive of amount of disease/tumor.  She is pleased with the education provided to her today regarding this.   She continues to feel great.  Her ADL's are not interfered by any signs of symptoms associated with her cancer.   She is happy to feel well.  We spent time discussing her options and concluded the following treatment plan:  1. Port flushes every 8 weeks  2. Labs every 16 weeks  3. Return in 16 weeks for follow-up  She will call us at any time if she changes her mind.  She mentions that she may be interested in a PET scan this fall which is she may be interested in chemotherapy, this would be indicated for staging purposes.    She asks about the role of PAP smears, mammograms, and colonoscopies moving forward.   I do not think there is a need for  colonoscopy given her Stage IV disease of colon.  I provided her information regarding signs and symptoms that may warrant a colonoscopy in fear of obstruction.  Mammograms can be done annually if she desires.  I do not think there is a role for PAP smears at this time.   Oncologically, she denies any complaints and ROS questioning is negative.   Past Medical History  Diagnosis Date  . Colon cancer     liver ca  . HTN (hypertension)   . Thyroid condition   . Diabetes mellitus   . Shingles   . Acute UTI   . Nausea 02/06/2012  . Port catheter in place 08/15/2012    has Colon adenocarcinoma; Thyroid condition; Diabetes type 2, uncontrolled; HTN (hypertension), benign; and Port catheter in place on her problem list.     has No Known Allergies.  Ms. Pankonin does not currently have medications on file.  Past Surgical History  Procedure Laterality Date  . Colectomy      and bil. oopherectomy  . Appendectomy    . Blt    . Resection liver partial / total w/ radiofrequency ablation    . Port-a-cath removal      has new port now  . Portacath placement  09/2010  . Oophorectomy Bilateral 2007  . Cataract surgery Right 01/10/13  . Cataract surgery Left 01/24/13    Denies any headaches, dizziness, double vision, fevers, chills, night sweats, nausea, vomiting, diarrhea, constipation, chest pain, heart palpitations, shortness of breath,  blood in stool, black tarry stool, urinary pain, urinary burning, urinary frequency, hematuria.   PHYSICAL EXAMINATION  ECOG PERFORMANCE STATUS: 0 - Asymptomatic  Filed Vitals:   03/31/13 0953  BP: 130/76  Pulse: 65  Temp: 97.7 F (36.5 C)  Resp: 16    GENERAL:alert, no distress, well nourished, well developed, comfortable, cooperative and smiling SKIN: skin color, texture, turgor are normal, no rashes or significant lesions HEAD: Normocephalic, No masses, lesions, tenderness or abnormalities EYES: normal, PERRLA, EOMI, Conjunctiva are pink and  non-injected EARS: External ears normal OROPHARYNX:mucous membranes are moist  NECK: supple, no adenopathy, thyroid normal size, non-tender, without nodularity, no stridor, non-tender, trachea midline LYMPH:  no palpable lymphadenopathy BREAST:not examined LUNGS: clear to auscultation and percussion HEART: regular rate & rhythm, no murmurs, no gallops, S1 normal and S2 normal ABDOMEN:abdomen soft, non-tender, normal bowel sounds and liver edge palpated 2 cm below the costophrenic margin at mid-clavicular line without tenderness BACK: Back symmetric, no curvature., left parathoracic mass noted without erythema, heat or pain. It is soft and mobile measuring about 1 CM EXTREMITIES:less then 2 second capillary refill, no joint deformities, effusion, or inflammation, no edema, no skin discoloration, no clubbing, no cyanosis  NEURO: alert & oriented x 3 with fluent speech, no focal motor/sensory deficits, gait normal   LABORATORY DATA: CBC    Component Value Date/Time   WBC 7.6 03/18/2013 1330   RBC 3.90 03/18/2013 1330   HGB 12.4 03/18/2013 1330   HCT 37.6 03/18/2013 1330   PLT 291 03/18/2013 1330   MCV 96.4 03/18/2013 1330   MCH 31.8 03/18/2013 1330   MCHC 33.0 03/18/2013 1330   RDW 13.4 03/18/2013 1330   LYMPHSABS 2.1 03/18/2013 1330   MONOABS 0.6 03/18/2013 1330   EOSABS 0.2 03/18/2013 1330   BASOSABS 0.0 03/18/2013 1330      Chemistry      Component Value Date/Time   NA 141 03/18/2013 1330   K 4.0 03/18/2013 1330   CL 101 03/18/2013 1330   CO2 27 03/18/2013 1330   BUN 10 03/18/2013 1330   CREATININE 0.66 03/18/2013 1330      Component Value Date/Time   CALCIUM 9.3 03/18/2013 1330   ALKPHOS 57 03/18/2013 1330   AST 16 03/18/2013 1330   ALT 13 03/18/2013 1330   BILITOT 0.3 03/18/2013 1330     Lab Results  Component Value Date   CEA 38.0* 03/18/2013     ASSESSMENT:  1. 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 She is status post  RFA ablation initially of single liver lesion in August 2012 followed by 6 cycles of FOLFOX/bevacizumab. That was completed on 04/03/2011. She then had a rising CEA level with recurrent disease in the liver and probably in the lung with small non-hypermetabolic pulmonary nodules. Therefore, she was started on FOLFIRI + Avastin from 01/16/2012- 04/08/2012 at which time her quality of life was declining and she requested a break from therapy. Her CEA recently went from 4.5 on 07/04/2012 to 20.0 on 08/15/2012, but most recently stabilized at 17.9 on 12/24/2012. Will continue drug holiday at this time.  2. Difficulty sleeping.  3. Liver palpated, see physical exam for details  4. 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 1 CM in diameter.  Patient Active Problem List   Diagnosis Date Noted  . Port catheter in place 08/15/2012  . Diabetes type 2, uncontrolled 04/13/2012  . HTN (hypertension), benign 04/13/2012  . Thyroid condition   .  Colon adenocarcinoma 08/23/2010    Class: Diagnosis of     PLAN:  1. I personally reviewed and went over laboratory results with the patient.  The results are noted within this dictation. 2. Port flushes every 8 weeks 3. Labs every 16 weeks: CBC diff, CMET, CEA 4. If she were to change her mind about her treatment plan, she will contact us and we can change/adapt her treatment plan to her liking.  We will continue with chemotherapy-holiday at her request. 5. Return in 16 weeks for follow-up.   THERAPY PLAN:  I again broached the subject of maintenance therapy for her metastatic colon cancer and the patient feels that this is not a good option for her with only marginal benefit in overall survival. Thus, we will continue with drug holiday and continue to follow her clinically and with serial CEAs. If stable, asymptomatic, I recommend continued drug-holiday, but if she desires or become symptomatic, we can restage her and consider treatment options.    All questions were answered. The patient knows to call the clinic with any problems, questions or concerns. We can certainly see the patient much sooner if necessary.  Patient and plan discussed with Dr. Farrel Gobble and he is in agreement with the aforementioned.   Million Maharaj 03/31/2013

## 2013-03-31 ENCOUNTER — Encounter (HOSPITAL_COMMUNITY): Payer: Self-pay | Admitting: Oncology

## 2013-03-31 ENCOUNTER — Encounter (HOSPITAL_BASED_OUTPATIENT_CLINIC_OR_DEPARTMENT_OTHER): Payer: Medicare Other | Admitting: Oncology

## 2013-03-31 VITALS — BP 130/76 | HR 65 | Temp 97.7°F | Resp 16 | Wt 181.8 lb

## 2013-03-31 DIAGNOSIS — C787 Secondary malignant neoplasm of liver and intrahepatic bile duct: Secondary | ICD-10-CM

## 2013-03-31 DIAGNOSIS — G4700 Insomnia, unspecified: Secondary | ICD-10-CM

## 2013-03-31 DIAGNOSIS — C19 Malignant neoplasm of rectosigmoid junction: Secondary | ICD-10-CM

## 2013-03-31 DIAGNOSIS — C189 Malignant neoplasm of colon, unspecified: Secondary | ICD-10-CM

## 2013-03-31 NOTE — Patient Instructions (Addendum)
[facility] Discharge Instructions  RECOMMENDATIONS MADE BY THE CONSULTANT AND ANY TEST RESULTS WILL BE SENT TO YOUR REFERRING PHYSICIAN.  EXAM FINDINGS BY THE PHYSICIAN TODAY AND SIGNS OR SYMPTOMS TO REPORT TO CLINIC OR PRIMARY PHYSICIAN: Exam and findings as discussed by Robynn Pane, PA- C.  You are doing well, despite your marker being higher.  Report unexplained weight loss, changes in bowel habits, blood in your stool etc.  MEDICATIONS PRESCRIBED:  none  INSTRUCTIONS/FOLLOW-UP: Port flushes every 8 weeks labs every 16 weeks and office visit in 4 months.  Thank you for choosing [facility] to provide your oncology and hematology care.  To afford each patient quality time with our providers, please arrive at least 15 minutes before your scheduled appointment time.  With your help, our goal is to use those 15 minutes to complete the necessary work-up to ensure our physicians have the information they need to help with your evaluation and healthcare recommendations.    Effective January 1st, 2014, we ask that you re-schedule your appointment with our physicians should you arrive 10 or more minutes late for your appointment.  We strive to give you quality time with our providers, and arriving late affects you and other patients whose appointments are after yours.    Again, thank you for choosing [facility].  Our hope is that these requests will decrease the amount of time that you wait before being seen by our physicians.       _____________________________________________________________  Should you have questions after your visit to [facility], please contact our office at [phone] between the hours of 8:30 a.m. and 5:00 p.m.  Voicemails left after 4:30 p.m. will not be returned until the following business day.  For prescription refill requests, have your pharmacy contact our office with your prescription refill request.

## 2013-04-29 ENCOUNTER — Other Ambulatory Visit (HOSPITAL_COMMUNITY): Payer: Medicare Other

## 2013-05-13 ENCOUNTER — Encounter (HOSPITAL_COMMUNITY): Payer: Medicare Other | Attending: Internal Medicine

## 2013-05-13 DIAGNOSIS — Z95828 Presence of other vascular implants and grafts: Secondary | ICD-10-CM

## 2013-05-13 DIAGNOSIS — C189 Malignant neoplasm of colon, unspecified: Secondary | ICD-10-CM

## 2013-05-13 DIAGNOSIS — Z452 Encounter for adjustment and management of vascular access device: Secondary | ICD-10-CM

## 2013-05-13 DIAGNOSIS — Z9889 Other specified postprocedural states: Secondary | ICD-10-CM | POA: Insufficient documentation

## 2013-05-13 MED ORDER — HEPARIN SOD (PORK) LOCK FLUSH 100 UNIT/ML IV SOLN
INTRAVENOUS | Status: AC
  Filled 2013-05-13: qty 5

## 2013-05-13 MED ORDER — HEPARIN SOD (PORK) LOCK FLUSH 100 UNIT/ML IV SOLN
500.0000 [IU] | Freq: Once | INTRAVENOUS | Status: AC
  Administered 2013-05-13: 500 [IU] via INTRAVENOUS

## 2013-05-13 MED ORDER — SODIUM CHLORIDE 0.9 % IJ SOLN
10.0000 mL | INTRAMUSCULAR | Status: DC | PRN
  Administered 2013-05-13: 10 mL via INTRAVENOUS

## 2013-05-13 NOTE — Progress Notes (Signed)
[name] presented for Portacath access and flush.  Proper placement of portacath confirmed by CXR.  Portacath located left chest wall accessed with  H 20 needle.  Good blood return present. Portacath flushed with 9ml NS and 500U/101ml Heparin and needle removed intact.  Procedure tolerated well and without incident.

## 2013-06-10 ENCOUNTER — Other Ambulatory Visit (HOSPITAL_COMMUNITY): Payer: Medicare Other

## 2013-07-07 NOTE — Progress Notes (Signed)
[name], MD [address] [place] [address]  Colon adenocarcinoma - Plan: Schedule Portacath Flush Appointment, heparin lock flush 100 unit/mL, sodium chloride 0.9 % injection 10 mL, CBC with Differential, Comprehensive metabolic panel, CEA  CURRENT THERAPY:Chemotherapy holiday/Surveillance  INTERVAL HISTORY: [name] 73 y.o. 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 She is status post RFA ablation initially of single liver lesion in August 2012 followed by 6 cycles of FOLFOX/bevacizumab. That was completed on 04/03/2011. She then had a rising CEA level with recurrent disease in the liver and probably in the lung with small non-hypermetabolic pulmonary nodules. Therefore, she was started on FOLFIRI + Avastin from 01/16/2012- 04/08/2012 at which time her quality of life was declining and she requested a break from therapy.   I personally reviewed and went over laboratory results with the patient.  The results are noted within this dictation.  Her CEA continues to rise most recently.  We will update labs today.  We again broached the subject of maintenance chemotherapy.  Avastin IV therapy alone provides only marginal benefit in overall survival of only about 1.3 months with Avastin alone.  This would require appointments to the [facility] for infusions and she is not interested in this interfering with her QOL for only marginal benefit.  She is not interested in Xeloda therapy.  Another option is Stivarga and today I provided her data regarding that it can provide a statistically significant survival advantage of 2 months.  Again this is marginal and with the possibility of significant side effects.  We would start with a lower dose of 80 mg and adjust according to her tolerability.  We will continue with drug Holiday per the patient's request. We will  perform labs with her port flushes every 8 weeks and see her back in 16 weeks. We can always change this plan moving forward.  She denies any complaints and her QOL is great.  She is not interested ina anything interfering with her QOL at this time.   Oncologically, she denies any complaints and ROS questioning is negative.   Past Medical History  Diagnosis Date  . Colon cancer     liver ca  . HTN (hypertension)   . Thyroid condition   . Diabetes mellitus   . Shingles   . Acute UTI   . Nausea 02/06/2012  . Port catheter in place 08/15/2012    has Colon adenocarcinoma; Thyroid condition; Diabetes type 2, uncontrolled; HTN (hypertension), benign; and Port catheter in place on her problem list.     has No Known Allergies.  We administered heparin lock flush and sodium chloride.  Past Surgical History  Procedure Laterality Date  . Colectomy      and bil. oopherectomy  . Appendectomy    . Blt    . Resection liver partial / total w/ radiofrequency ablation    . Port-a-cath removal      has new port now  . Portacath placement  09/2010  . Oophorectomy Bilateral 2007  . Cataract surgery Right 01/10/13  . Cataract surgery Left 01/24/13    Denies any headaches, dizziness, double vision, fevers, chills, night sweats, nausea, vomiting, diarrhea, constipation, chest pain, heart palpitations, shortness of breath, blood in stool, black tarry stool, urinary pain, urinary burning, urinary frequency, hematuria.   PHYSICAL EXAMINATION  ECOG PERFORMANCE STATUS: 0 - Asymptomatic  Filed Vitals:  07/08/13 1000  BP: 156/84  Pulse: 84  Temp: 98 F (36.7 C)  Resp: 18    GENERAL:alert, no distress, well nourished, well developed, comfortable, cooperative and smiling SKIN: skin color, texture, turgor are normal, no rashes or significant lesions HEAD: Normocephalic, No masses, lesions, tenderness or abnormalities EYES: normal, PERRLA, EOMI, Conjunctiva are pink and non-injected EARS: External  ears normal OROPHARYNX:mucous membranes are moist  NECK: supple, trachea midline LYMPH:  not examined BREAST:not examined LUNGS: not examined HEART: not examined ABDOMEN:not examined BACK: Back symmetric, no curvature. EXTREMITIES:no clubbing, no cyanosis  NEURO: alert & oriented x 3 with fluent speech, no focal motor/sensory deficits, gait normal    LABORATORY DATA: CBC    Component Value Date/Time   WBC 7.0 07/08/2013 1030   RBC 3.97 07/08/2013 1030   HGB 12.5 07/08/2013 1030   HCT 37.9 07/08/2013 1030   PLT 291 07/08/2013 1030   MCV 95.5 07/08/2013 1030   MCH 31.5 07/08/2013 1030   MCHC 33.0 07/08/2013 1030   RDW 13.1 07/08/2013 1030   LYMPHSABS 1.7 07/08/2013 1030   MONOABS 0.5 07/08/2013 1030   EOSABS 0.2 07/08/2013 1030   BASOSABS 0.0 07/08/2013 1030      Chemistry      Component Value Date/Time   NA 141 07/08/2013 1030   K 4.0 07/08/2013 1030   CL 104 07/08/2013 1030   CO2 26 07/08/2013 1030   BUN 10 07/08/2013 1030   CREATININE 0.74 07/08/2013 1030      Component Value Date/Time   CALCIUM 9.2 07/08/2013 1030   ALKPHOS 63 07/08/2013 1030   AST 15 07/08/2013 1030   ALT 14 07/08/2013 1030   BILITOT 0.4 07/08/2013 1030     Lab Results  Component Value Date   CEA 38.0* 03/18/2013      ASSESSMENT:  1. 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 She is status post RFA ablation initially of single liver lesion in August 2012 followed by 6 cycles of FOLFOX/bevacizumab. That was completed on 04/03/2011. She then had a rising CEA level with recurrent disease in the liver and probably in the lung with small non-hypermetabolic pulmonary nodules. Therefore, she was started on FOLFIRI + Avastin from 01/16/2012- 04/08/2012 at which time her quality of life was declining and she requested a break from therapy.  Will continue drug holiday at this time.  2. Difficulty sleeping.  3. Liver palpated, see physical exam for details  4. 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 1 CM in diameter.  Patient Active Problem List   Diagnosis Date Noted  . Port catheter in place 08/15/2012  . Diabetes type 2, uncontrolled 04/13/2012  . HTN (hypertension), benign 04/13/2012  . Thyroid condition   . Colon adenocarcinoma 08/23/2010    Class: Diagnosis of     PLAN:  1. I personally reviewed and went over laboratory results with the patient.  2. Labs in 8 weeks with port flushes: CBC diff, CMET, CEA  3. Port flushes every 8 weeks 4. Discussion regarding Stivarga therapy as an option given her rising CEA.  She declines which is appropriate given her excellent QOL. 5. Continue drug Holiday.  6. Port flush and labs today: CBC diff, CMET, CEA 7. Return in 16 weeks for follow-up.     THERAPY PLAN:  I again broached the subject of maintenance therapy for her metastatic colon cancer.  We discussed Avastin alone, Avastin + Xeloda, and Stivarga.  She  is not interested in Xeloda tgherapy as she has had a poor experience with that in the past.  Therefore, Avastin alone provides 1.3 months of progression free survival.  Stivarga, provides a 2 month overall survival advantage, but does carry a host of side effects.  If she chose Stivarga, we would start at a reduced dose of 80 mg days 1-21 every 28 days.  At this time, she is not interested in therapy in fear of QOL interference.  Therefore, we will continue with drug holiday.  All questions were answered. The patient knows to call the clinic with any problems, questions or concerns. We can certainly see the patient much sooner if necessary.  Patient and plan discussed with Dr. Farrel Gobble and he is in agreement with the aforementioned.   More than 50% of the time spent with the patient was utilized for counseling and coordination of care.   KEFALAS,THOMAS 07/08/2013

## 2013-07-08 ENCOUNTER — Encounter (HOSPITAL_COMMUNITY): Payer: Medicare Other | Attending: Internal Medicine

## 2013-07-08 ENCOUNTER — Encounter (HOSPITAL_COMMUNITY): Payer: Self-pay | Admitting: Oncology

## 2013-07-08 ENCOUNTER — Encounter (HOSPITAL_BASED_OUTPATIENT_CLINIC_OR_DEPARTMENT_OTHER): Payer: Medicare Other | Admitting: Oncology

## 2013-07-08 VITALS — BP 156/84 | HR 84 | Temp 98.0°F | Resp 18 | Wt 179.7 lb

## 2013-07-08 DIAGNOSIS — C189 Malignant neoplasm of colon, unspecified: Secondary | ICD-10-CM | POA: Diagnosis present

## 2013-07-08 DIAGNOSIS — R918 Other nonspecific abnormal finding of lung field: Secondary | ICD-10-CM

## 2013-07-08 DIAGNOSIS — C19 Malignant neoplasm of rectosigmoid junction: Secondary | ICD-10-CM

## 2013-07-08 DIAGNOSIS — C787 Secondary malignant neoplasm of liver and intrahepatic bile duct: Secondary | ICD-10-CM

## 2013-07-08 DIAGNOSIS — Z452 Encounter for adjustment and management of vascular access device: Secondary | ICD-10-CM

## 2013-07-08 LAB — CBC WITH DIFFERENTIAL/PLATELET
Basophils Absolute: 0 10*3/uL (ref 0.0–0.1)
Basophils Relative: 1 % (ref 0–1)
EOS ABS: 0.2 10*3/uL (ref 0.0–0.7)
EOS PCT: 3 % (ref 0–5)
HCT: 37.9 % (ref 36.0–46.0)
HEMOGLOBIN: 12.5 g/dL (ref 12.0–15.0)
Lymphocytes Relative: 24 % (ref 12–46)
Lymphs Abs: 1.7 10*3/uL (ref 0.7–4.0)
MCH: 31.5 pg (ref 26.0–34.0)
MCHC: 33 g/dL (ref 30.0–36.0)
MCV: 95.5 fL (ref 78.0–100.0)
MONOS PCT: 7 % (ref 3–12)
Monocytes Absolute: 0.5 10*3/uL (ref 0.1–1.0)
Neutro Abs: 4.6 10*3/uL (ref 1.7–7.7)
Neutrophils Relative %: 65 % (ref 43–77)
Platelets: 291 10*3/uL (ref 150–400)
RBC: 3.97 MIL/uL (ref 3.87–5.11)
RDW: 13.1 % (ref 11.5–15.5)
WBC: 7 10*3/uL (ref 4.0–10.5)

## 2013-07-08 LAB — COMPREHENSIVE METABOLIC PANEL
ALK PHOS: 63 U/L (ref 39–117)
ALT: 14 U/L (ref 0–35)
AST: 15 U/L (ref 0–37)
Albumin: 3.6 g/dL (ref 3.5–5.2)
Anion gap: 11 (ref 5–15)
BUN: 10 mg/dL (ref 6–23)
CALCIUM: 9.2 mg/dL (ref 8.4–10.5)
CO2: 26 mEq/L (ref 19–32)
Chloride: 104 mEq/L (ref 96–112)
Creatinine, Ser: 0.74 mg/dL (ref 0.50–1.10)
GFR calc non Af Amer: 84 mL/min — ABNORMAL LOW (ref >90)
GLUCOSE: 136 mg/dL — AB (ref 70–99)
Potassium: 4 mEq/L (ref 3.7–5.3)
Sodium: 141 mEq/L (ref 137–147)
TOTAL PROTEIN: 6.9 g/dL (ref 6.0–8.3)
Total Bilirubin: 0.4 mg/dL (ref 0.3–1.2)

## 2013-07-08 MED ORDER — HEPARIN SOD (PORK) LOCK FLUSH 100 UNIT/ML IV SOLN
500.0000 [IU] | Freq: Once | INTRAVENOUS | Status: AC
  Administered 2013-07-08: 500 [IU] via INTRAVENOUS
  Filled 2013-07-08: qty 5

## 2013-07-08 MED ORDER — SODIUM CHLORIDE 0.9 % IJ SOLN
10.0000 mL | INTRAMUSCULAR | Status: DC | PRN
  Administered 2013-07-08: 10 mL via INTRAVENOUS

## 2013-07-08 NOTE — Patient Instructions (Signed)
[facility] Discharge Instructions  RECOMMENDATIONS MADE BY THE CONSULTANT AND ANY TEST RESULTS WILL BE SENT TO YOUR REFERRING PHYSICIAN.  We will see you every 8 weeks for port flushes. You will see Tom in 4 months for a port flush and repeat labs.   Thank you for choosing [facility] to provide your oncology and hematology care.  To afford each patient quality time with our providers, please arrive at least 15 minutes before your scheduled appointment time.  With your help, our goal is to use those 15 minutes to complete the necessary work-up to ensure our physicians have the information they need to help with your evaluation and healthcare recommendations.    Effective January 1st, 2014, we ask that you re-schedule your appointment with our physicians should you arrive 10 or more minutes late for your appointment.  We strive to give you quality time with our providers, and arriving late affects you and other patients whose appointments are after yours.    Again, thank you for choosing [facility].  Our hope is that these requests will decrease the amount of time that you wait before being seen by our physicians.       _____________________________________________________________  Should you have questions after your visit to [facility], please contact our office at [phone] between the hours of 8:30 a.m. and 4:30 p.m.  Voicemails left after 4:30 p.m. will not be returned until the following business day.  For prescription refill requests, have your pharmacy contact our office with your prescription refill request.    _______________________________________________________________  We hope that we have given you very good care.  You may receive a patient satisfaction survey in the mail, please complete it and return it as soon as possible.  We value your  feedback!  _______________________________________________________________  Have you asked about our STAR program?  STAR stands for Survivorship Training and Rehabilitation, and this is a nationally recognized cancer care program that focuses on survivorship and rehabilitation.  Cancer and cancer treatments may cause problems, such as, pain, making you feel tired and keeping you from doing the things that you need or want to do. Cancer rehabilitation can help. Our goal is to reduce these troubling effects and help you have the best quality of life possible.  You may receive a survey from a nurse that asks questions about your current state of health.  Based on the survey results, all eligible patients will be referred to the [facility] program for an evaluation so we can better serve you!  A frequently asked questions sheet is available upon request.

## 2013-07-08 NOTE — Progress Notes (Signed)
[name] presented for Portacath access and flush. Portacath located left chest wall accessed with  H 20 needle.  Good blood return present. Portacath flushed with 17ml NS and 500U/37ml Heparin and needle removed intact.  Procedure tolerated well and without incident.

## 2013-07-09 ENCOUNTER — Encounter (HOSPITAL_COMMUNITY): Payer: Self-pay

## 2013-07-09 LAB — CEA: CEA: 114.1 ng/mL — ABNORMAL HIGH (ref 0.0–5.0)

## 2013-07-21 ENCOUNTER — Ambulatory Visit (HOSPITAL_COMMUNITY): Payer: Medicare Other | Admitting: Oncology

## 2013-07-22 ENCOUNTER — Other Ambulatory Visit (HOSPITAL_COMMUNITY): Payer: Medicare Other

## 2013-07-23 ENCOUNTER — Ambulatory Visit (HOSPITAL_COMMUNITY): Payer: BC Managed Care – HMO | Admitting: Oncology

## 2013-09-02 ENCOUNTER — Encounter (HOSPITAL_COMMUNITY): Payer: Medicare Other | Attending: Internal Medicine

## 2013-09-02 ENCOUNTER — Other Ambulatory Visit (HOSPITAL_COMMUNITY): Payer: Medicare Other

## 2013-09-02 DIAGNOSIS — C19 Malignant neoplasm of rectosigmoid junction: Secondary | ICD-10-CM

## 2013-09-02 DIAGNOSIS — C189 Malignant neoplasm of colon, unspecified: Secondary | ICD-10-CM | POA: Insufficient documentation

## 2013-09-02 DIAGNOSIS — Z452 Encounter for adjustment and management of vascular access device: Secondary | ICD-10-CM

## 2013-09-02 DIAGNOSIS — Z95828 Presence of other vascular implants and grafts: Secondary | ICD-10-CM

## 2013-09-02 MED ORDER — HEPARIN SOD (PORK) LOCK FLUSH 100 UNIT/ML IV SOLN
500.0000 [IU] | Freq: Once | INTRAVENOUS | Status: AC
  Administered 2013-09-02: 500 [IU] via INTRAVENOUS
  Filled 2013-09-02: qty 5

## 2013-09-02 MED ORDER — SODIUM CHLORIDE 0.9 % IJ SOLN
10.0000 mL | Freq: Once | INTRAMUSCULAR | Status: AC
  Administered 2013-09-02: 10 mL via INTRAVENOUS

## 2013-09-02 NOTE — Progress Notes (Signed)
[name] presented for Portacath access and flush. Proper placement of portacath confirmed by CXR. Portacath located left chest wall accessed with  H 20 needle. Good blood return present. Portacath flushed with 11ml NS and 500U/88ml Heparin and needle removed intact. Procedure without incident. Patient tolerated procedure well.

## 2013-10-14 ENCOUNTER — Other Ambulatory Visit (HOSPITAL_COMMUNITY): Payer: Medicare Other

## 2013-10-27 NOTE — Progress Notes (Signed)
[name], MD [address] [place] [address]  Colon adenocarcinoma - Plan: CBC with Differential, Comprehensive metabolic panel, CEA, CT Abdomen Pelvis W Contrast, CT Chest W Contrast, CT Head W Wo Contrast, CANCELED: CT Head W Contrast, CANCELED: CT Head W Wo Contrast  Diabetes type 2, uncontrolled - Plan: Hemoglobin A1c, Hemoglobin A1c, Microalbumin, urine, 24 hour, Microalbumin, urine, 24 hour  Insomnia - Plan: LORazepam (ATIVAN) 0.5 MG tablet  Port catheter in place - Plan: sodium chloride 0.9 % injection 10 mL, heparin lock flush 100 unit/mL, sodium chloride 0.9 % injection 10 mL  Muscle spasms of neck - Plan: cyclobenzaprine (FLEXERIL) 10 MG tablet  Worsening headaches - Plan: CANCELED: CT Head W Wo Contrast  Preventative health care - Plan: Microalbumin, urine, Microalbumin, urine  Nausea without vomiting  Neoplastic malignant related fatigue  RUQ abdominal pain  CURRENT THERAPY: Chemotherapy holiday/Surveillance  INTERVAL HISTORY: IVELIZ GARAY 73 y.o. 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 She is status post RFA ablation initially of single liver lesion in August 2012 followed by 6 cycles of FOLFOX/bevacizumab. That was completed on 04/03/2011. She then had a rising CEA level with recurrent disease in the liver and probably in the lung with small non-hypermetabolic pulmonary nodules. Therefore, she was started on FOLFIRI + Avastin from 01/16/2012- 04/08/2012 at which time her quality of life was declining and she requested a break from therapy.   I personally reviewed and went over laboratory results with the patient.  The results are noted within this dictation.  CEA continues to climb and in July, CEA was 114.1  We again broached the subject of maintenance chemotherapy. Avastin IV therapy alone provides only marginal benefit in overall survival of  only about 1.3 months with Avastin alone. This would require appointments to the [facility] for infusions and she is not interested in this interfering with her QOL for only marginal benefit. She is not interested in Xeloda therapy. Another option is Stivarga which can provide a statistically significant survival advantage of 2 months. Again this is marginal and with the possibility of significant side effects. We would start with a lower dose of 80 mg and adjust according to her tolerability.  Miu has a few issues that she is interested in me evaluating/addressing: 1. Will you order a Hgb A1c for me?  Since we are flushing port, we will order and fax results to Dr. Edrick Oh. 2. Can I have a refill on Ativan?  She reports that she has not taken in some time, but it helps her with sleep and anxiety.  This is certainly a reasonable treatment option for her. 3. RUQ pain that comes and goes, may last minutes or may last days.  She does have her gallbladder.  She denies any association with food consumption.  She denies any fevers or chills.  Palpation does not change the pain.  Positional changes does not affect tenderness.   4. Neck pain, particularly on left, and back of head headaches.  On exam her trapezius muscles are both tight and hard to palpation.  She also has a left sternocleidomastoid tenderness.  She denies any trauma, but her husband reports that she sleeps in a recliner with her head in an awkward position.   5. LE muscle cramps bilaterally 6. Slight headaches 7. Increased tiredness 8. Increased nausea without vomiting. 9. Can you order a albuminuria  test because my insurance is asking about it and I can be awarded $75 for completing this test?  I will order.  We had a long discussion regarding the role of imaging tests.  We discussed CT imaging versus PET imaging.  She has a number of complaints above that worry me for progression of disease that may be affecting her QOL since  she typically never complains of any issues.  Her CEA has demonstrated increase for some time, but she wanted to remain of a drug holiday to restore her QOL.  Now would be a good time to restage if she desires as this may allow her to make a better decision regarding whether she wants future treatment or not.  CT imaging would be the first best step and she is agreeable to this plan.  On exam, I note some muscle tightness of neck and upper back.  I will address this with muscle relaxant.  Other than the aforementioned, she denies any other complaints.  Past Medical History  Diagnosis Date  . Colon cancer     liver ca  . HTN (hypertension)   . Thyroid condition   . Diabetes mellitus   . Shingles   . Acute UTI   . Nausea 02/06/2012  . Port catheter in place 08/15/2012    has Colon adenocarcinoma; Thyroid condition; Diabetes type 2, uncontrolled; HTN (hypertension), benign; and Port catheter in place on her problem list.     has No Known Allergies.  We administered sodium chloride, heparin lock flush, and sodium chloride.  Past Surgical History  Procedure Laterality Date  . Colectomy      and bil. oopherectomy  . Appendectomy    . Blt    . Resection liver partial / total w/ radiofrequency ablation    . Port-a-cath removal      has new port now  . Portacath placement  09/2010  . Oophorectomy Bilateral 2007  . Cataract surgery Right 01/10/13  . Cataract surgery Left 01/24/13    Denies any headaches, dizziness, double vision, fevers, chills, night sweats, nausea, vomiting, diarrhea, constipation, chest pain, heart palpitations, shortness of breath, blood in stool, black tarry stool, urinary pain, urinary burning, urinary frequency, hematuria.   PHYSICAL EXAMINATION  ECOG PERFORMANCE STATUS: 1 - Symptomatic but completely ambulatory  Filed Vitals:   10/29/13 1000  BP: 187/78  Pulse: 61  Temp: 98.4 F (36.9 C)  Resp: 18    GENERAL:alert, no distress, well nourished, well  developed, comfortable, cooperative and smiling SKIN: skin color, texture, turgor are normal, no rashes or significant lesions HEAD: Normocephalic, No masses, lesions, tenderness or abnormalities EYES: normal, PERRLA, EOMI, Conjunctiva are pink and non-injected EARS: External ears normal OROPHARYNX:lips, buccal mucosa, and tongue normal and mucous membranes are moist  NECK: supple, no adenopathy, thyroid normal size, non-tender, without nodularity, no stridor, non-tender, trachea midline, left sternocleidomastoid muscle tightness LYMPH:  no palpable lymphadenopathy, no hepatosplenomegaly BREAST:not examined LUNGS: clear to auscultation and percussion HEART: regular rate & rhythm, no murmurs, no gallops, S1 normal and S2 normal ABDOMEN:abdomen soft, non-tender, obese, normal bowel sounds, no masses or organomegaly and no hepatosplenomegaly BACK: Back symmetric, no curvature., No CVA tenderness EXTREMITIES:less then 2 second capillary refill, no joint deformities, effusion, or inflammation, no edema, no skin discoloration, no clubbing, no cyanosis  NEURO: alert & oriented x 3 with fluent speech, no focal motor/sensory deficits, gait normal    LABORATORY DATA: CBC    Component Value Date/Time   WBC 8.4 10/29/2013  1012   RBC 4.11 10/29/2013 1012   HGB 12.7 10/29/2013 1012   HCT 38.5 10/29/2013 1012   PLT 284 10/29/2013 1012   MCV 93.7 10/29/2013 1012   MCH 30.9 10/29/2013 1012   MCHC 33.0 10/29/2013 1012   RDW 13.4 10/29/2013 1012   LYMPHSABS 1.7 10/29/2013 1012   MONOABS 0.6 10/29/2013 1012   EOSABS 0.4 10/29/2013 1012   BASOSABS 0.1 10/29/2013 1012      Chemistry      Component Value Date/Time   NA 138 10/29/2013 1012   K 4.0 10/29/2013 1012   CL 99 10/29/2013 1012   CO2 27 10/29/2013 1012   BUN 9 10/29/2013 1012   CREATININE 0.65 10/29/2013 1012      Component Value Date/Time   CALCIUM 9.3 10/29/2013 1012   ALKPHOS 72 10/29/2013 1012   AST 18 10/29/2013 1012   ALT 15  10/29/2013 1012   BILITOT 0.4 10/29/2013 1012     Lab Results  Component Value Date   CEA 114.1* 07/08/2013     ASSESSMENT:  1. 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 She is status post RFA ablation initially of single liver lesion in August 2012 followed by 6 cycles of FOLFOX/bevacizumab. That was completed on 04/03/2011. She then had a rising CEA level with recurrent disease in the liver and probably in the lung with small non-hypermetabolic pulmonary nodules. Therefore, she was started on FOLFIRI + Avastin from 01/16/2012- 04/08/2012 at which time her quality of life was declining and she requested a break from therapy. Will continue drug holiday at this time.  2. Difficulty sleeping.  3. Liver not palpable today  4. 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 1 CM in diameter. 5. Neck and back of head pain 6. Headaches 7. Nausea without vomiting 8. Increased tiredness 9. RUQ tenderness 10. Muscle tenderness/tightness of neck and upper back.  Patient Active Problem List   Diagnosis Date Noted  . Port catheter in place 08/15/2012  . Diabetes type 2, uncontrolled 04/13/2012  . HTN (hypertension), benign 04/13/2012  . Thyroid condition   . Colon adenocarcinoma 08/23/2010    Class: Diagnosis of     PLAN:  1. I personally reviewed and went over laboratory results with the patient.  2. CT Head with and without contrast and CT CAP with contrast for restaging. 3. Labs today: CBC diff, CMET 4. Additional labs today: Hgb A1c 5. Additional urine test: random urine albumin 6. Rx for Ativan 7. Rx for Flexeril 10 mg every 8 hours PRN 8. Return in 1 week for follow-up to review labs and CT imaging tests and discuss future treatment options.    THERAPY PLAN:  With her recent complaints, she is agreeable to pursue CT imaging and restaging to see the status of her disease.  Her CEA continues to climb and is most  recently in triple digits.  I am concerned for progression of disease that is affecting her QOL.  The and patient understands that this was a possibility when she continued her drug holiday.  She is interested in evaluating her disease burden and discussing possible interventions that may be offered.   All questions were answered. The patient knows to call the clinic with any problems, questions or concerns. We can certainly see the patient much sooner if necessary.  Patient and plan discussed with Dr. Farrel Gobble and he is in agreement with the aforementioned.   KEFALAS,THOMAS 10/29/2013

## 2013-10-28 ENCOUNTER — Other Ambulatory Visit (HOSPITAL_COMMUNITY): Payer: Medicare Other

## 2013-10-29 ENCOUNTER — Encounter (HOSPITAL_COMMUNITY): Payer: Medicare Other | Attending: Internal Medicine | Admitting: Oncology

## 2013-10-29 ENCOUNTER — Encounter (HOSPITAL_COMMUNITY): Payer: Self-pay | Admitting: Oncology

## 2013-10-29 ENCOUNTER — Encounter (HOSPITAL_COMMUNITY): Payer: Medicare Other

## 2013-10-29 VITALS — BP 187/78 | HR 61 | Temp 98.4°F | Resp 18 | Wt 178.0 lb

## 2013-10-29 DIAGNOSIS — Z452 Encounter for adjustment and management of vascular access device: Secondary | ICD-10-CM

## 2013-10-29 DIAGNOSIS — E1165 Type 2 diabetes mellitus with hyperglycemia: Secondary | ICD-10-CM

## 2013-10-29 DIAGNOSIS — M62838 Other muscle spasm: Secondary | ICD-10-CM

## 2013-10-29 DIAGNOSIS — Z0000 Encounter for general adult medical examination without abnormal findings: Secondary | ICD-10-CM

## 2013-10-29 DIAGNOSIS — C787 Secondary malignant neoplasm of liver and intrahepatic bile duct: Secondary | ICD-10-CM

## 2013-10-29 DIAGNOSIS — Z95828 Presence of other vascular implants and grafts: Secondary | ICD-10-CM

## 2013-10-29 DIAGNOSIS — R110 Nausea: Secondary | ICD-10-CM

## 2013-10-29 DIAGNOSIS — C19 Malignant neoplasm of rectosigmoid junction: Secondary | ICD-10-CM

## 2013-10-29 DIAGNOSIS — Z9889 Other specified postprocedural states: Secondary | ICD-10-CM | POA: Diagnosis not present

## 2013-10-29 DIAGNOSIS — R51 Headache: Secondary | ICD-10-CM

## 2013-10-29 DIAGNOSIS — R519 Headache, unspecified: Secondary | ICD-10-CM

## 2013-10-29 DIAGNOSIS — R530 Neoplastic (malignant) related fatigue: Secondary | ICD-10-CM

## 2013-10-29 DIAGNOSIS — C189 Malignant neoplasm of colon, unspecified: Secondary | ICD-10-CM

## 2013-10-29 DIAGNOSIS — H40059 Ocular hypertension, unspecified eye: Secondary | ICD-10-CM

## 2013-10-29 DIAGNOSIS — R1011 Right upper quadrant pain: Secondary | ICD-10-CM

## 2013-10-29 DIAGNOSIS — G4700 Insomnia, unspecified: Secondary | ICD-10-CM

## 2013-10-29 DIAGNOSIS — IMO0002 Reserved for concepts with insufficient information to code with codable children: Secondary | ICD-10-CM

## 2013-10-29 LAB — COMPREHENSIVE METABOLIC PANEL
ALT: 15 U/L (ref 0–35)
ANION GAP: 12 (ref 5–15)
AST: 18 U/L (ref 0–37)
Albumin: 3.7 g/dL (ref 3.5–5.2)
Alkaline Phosphatase: 72 U/L (ref 39–117)
BUN: 9 mg/dL (ref 6–23)
CALCIUM: 9.3 mg/dL (ref 8.4–10.5)
CO2: 27 mEq/L (ref 19–32)
CREATININE: 0.65 mg/dL (ref 0.50–1.10)
Chloride: 99 mEq/L (ref 96–112)
GFR, EST NON AFRICAN AMERICAN: 87 mL/min — AB (ref >90)
GLUCOSE: 174 mg/dL — AB (ref 70–99)
Potassium: 4 mEq/L (ref 3.7–5.3)
Sodium: 138 mEq/L (ref 137–147)
Total Bilirubin: 0.4 mg/dL (ref 0.3–1.2)
Total Protein: 7.4 g/dL (ref 6.0–8.3)

## 2013-10-29 LAB — CBC WITH DIFFERENTIAL/PLATELET
Basophils Absolute: 0.1 10*3/uL (ref 0.0–0.1)
Basophils Relative: 1 % (ref 0–1)
EOS ABS: 0.4 10*3/uL (ref 0.0–0.7)
EOS PCT: 5 % (ref 0–5)
HEMATOCRIT: 38.5 % (ref 36.0–46.0)
HEMOGLOBIN: 12.7 g/dL (ref 12.0–15.0)
LYMPHS ABS: 1.7 10*3/uL (ref 0.7–4.0)
LYMPHS PCT: 20 % (ref 12–46)
MCH: 30.9 pg (ref 26.0–34.0)
MCHC: 33 g/dL (ref 30.0–36.0)
MCV: 93.7 fL (ref 78.0–100.0)
MONO ABS: 0.6 10*3/uL (ref 0.1–1.0)
MONOS PCT: 7 % (ref 3–12)
Neutro Abs: 5.7 10*3/uL (ref 1.7–7.7)
Neutrophils Relative %: 67 % (ref 43–77)
Platelets: 284 10*3/uL (ref 150–400)
RBC: 4.11 MIL/uL (ref 3.87–5.11)
RDW: 13.4 % (ref 11.5–15.5)
WBC: 8.4 10*3/uL (ref 4.0–10.5)

## 2013-10-29 LAB — HEMOGLOBIN A1C
HEMOGLOBIN A1C: 8.4 % — AB (ref <5.7)
Mean Plasma Glucose: 194 mg/dL — ABNORMAL HIGH (ref <117)

## 2013-10-29 MED ORDER — SODIUM CHLORIDE 0.9 % IJ SOLN
10.0000 mL | Freq: Once | INTRAMUSCULAR | Status: AC
  Administered 2013-10-29: 10 mL via INTRAVENOUS

## 2013-10-29 MED ORDER — HEPARIN SOD (PORK) LOCK FLUSH 100 UNIT/ML IV SOLN
INTRAVENOUS | Status: AC
  Filled 2013-10-29: qty 5

## 2013-10-29 MED ORDER — HEPARIN SOD (PORK) LOCK FLUSH 100 UNIT/ML IV SOLN
500.0000 [IU] | Freq: Once | INTRAVENOUS | Status: AC
  Administered 2013-10-29: 500 [IU] via INTRAVENOUS

## 2013-10-29 MED ORDER — LORAZEPAM 0.5 MG PO TABS: 1.0000 mg | ORAL_TABLET | Freq: Three times a day (TID) | ORAL | Status: DC | PRN

## 2013-10-29 MED ORDER — CYCLOBENZAPRINE HCL 10 MG PO TABS: 10.0000 mg | ORAL_TABLET | Freq: Three times a day (TID) | ORAL | Status: DC | PRN

## 2013-10-29 NOTE — Patient Instructions (Addendum)
[place] Discharge Instructions  RECOMMENDATIONS MADE BY THE CONSULTANT AND ANY TEST RESULTS WILL BE SENT TO YOUR REFERRING PHYSICIAN.  EXAM FINDINGS BY THE PHYSICIAN TODAY AND SIGNS OR SYMPTOMS TO REPORT TO CLINIC OR PRIMARY PHYSICIAN: Exam and findings as discussed by Meriel Flavors.  MEDICATIONS PRESCRIBED:  Continue as prescribed. You were given 2 Talia Hoheisel prescriptions for Ativan and Flexeril.  INSTRUCTIONS/FOLLOW-UP: Port flush and lab work today. We will call you if there are any abnormal test results. CT scans to be scheduled next week of head,chest,abdomen and pelvis. Continue port flush every 6 weeks. MD appointment after CT scans.  Thank you for choosing [facility] to provide your oncology and hematology care.  To afford each patient quality time with our providers, please arrive at least 15 minutes before your scheduled appointment time.  With your help, our goal is to use those 15 minutes to complete the necessary work-up to ensure our physicians have the information they need to help with your evaluation and healthcare recommendations.    Effective January 1st, 2014, we ask that you re-schedule your appointment with our physicians should you arrive 10 or more minutes late for your appointment.  We strive to give you quality time with our providers, and arriving late affects you and other patients whose appointments are after yours.    Again, thank you for choosing [facility].  Our hope is that these requests will decrease the amount of time that you wait before being seen by our physicians.       _____________________________________________________________  Should you have questions after your visit to [facility], please contact our office at [phone] between the hours of 8:30 a.m. and 4:30 p.m.  Voicemails left after 4:30 p.m. will not be returned until the following business day.  For prescription refill  requests, have your pharmacy contact our office with your prescription refill request.    _______________________________________________________________  We hope that we have given you very good care.  You may receive a patient satisfaction survey in the mail, please complete it and return it as soon as possible.  We value your feedback!  _______________________________________________________________  Have you asked about our STAR program?  STAR stands for Survivorship Training and Rehabilitation, and this is a nationally recognized cancer care program that focuses on survivorship and rehabilitation.  Cancer and cancer treatments may cause problems, such as, pain, making you feel tired and keeping you from doing the things that you need or want to do. Cancer rehabilitation can help. Our goal is to reduce these troubling effects and help you have the best quality of life possible.  You may receive a survey from a nurse that asks questions about your current state of health.  Based on the survey results, all eligible patients will be referred to the [facility] program for an evaluation so we can better serve you!  A frequently asked questions sheet is available upon request.

## 2013-10-29 NOTE — Progress Notes (Signed)
please see other encounter for information

## 2013-10-30 LAB — CEA: CEA: 277 ng/mL — AB (ref 0.0–5.0)

## 2013-11-03 NOTE — Progress Notes (Signed)
Anita Mustache, MD [address] [place] [address]  Colon adenocarcinoma  CURRENT THERAPY: Review of restaging scans and discussion about potential treatment options  INTERVAL HISTORY: [name] 73 y.o. 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 She is status post RFA ablation initially of single liver lesion in August 2012 followed by 6 cycles of FOLFOX/bevacizumab. That was completed on 04/03/2011. She then had a rising CEA level with recurrent disease in the liver and probably in the lung with small non-hypermetabolic pulmonary nodules. Therefore, she was started on FOLFIRI + Avastin from 01/16/2012- 04/08/2012 at which time her quality of life was declining and she requested a break from therapy.     Colon adenocarcinoma   11/04/2013 Progression CT CAP and head- Interval progression of disease within the chest, abdomen. Progression of pulmonary metastasis. Increase in size of right hepatic lobe metastasis. Progression of retroperitoneal adenopathy and peritoneal adenopathy  metastasis     On our last encounter, she had a number of issues, please see dictation from 10/27/13, which were and indication to me that her disease was progressing clinically and causing some QOL issues.  Therefore, with the patient, we agreed upon a plan that consisted of restaging and consideration of future treatment.   Today, we are reviewing labs and CT imaging tests.  I personally reviewed and went over laboratory results with the patient.  The results are noted within this dictation.  Labs demonstrate a CBC and diff WNL and an unimpressive CMET.  CEA is up to 277 (compared to 114----38----29----17.9).  I personally reviewed and went over radiographic studies with the patient.  The results are noted within this dictation.  CT of head is negative for any intracranial abnormalities.  CT CAP  demonstrates:  1. Interval progression of disease within the chest, abdomen. 2. Progression of pulmonary metastasis. 3. Increase in size of right hepatic lobe metastasis. 4. Progression of retroperitoneal adenopathy and peritoneal metastasis.  As a result, she was offered a few treatment options: A. Continue monitoring with labs and management of any symptoms B. 5FU/Leucovorin + Avastin every 14 days with a 5FU pump x 2 days C. Xeloda 1000 mg BID 14 days on and 7 days off with Avastin IV every 21 days. D. Stivarga 80 mg daily x 21 days and 7 day respite with the opportunity to increase dose according to patient's tolerance.  We discussed the pros and cons of each option.  I reviewed the risks, benefits, alternatives, and side effects of each intervention.  I discussed the mechanism of action of Stivarga as this is a new medication to her in addition to a more detailed review of side effects of this medication.  I provided her education regarding her malignancy.  She was quotes a 9-12 month overall survival if she did not pursue systemic chemotherapy.  With therapy, overall survival is TBD.    I have asked her to consider her options over the next week and call us with her desired path of treatment.  She is considering 5FU/Leucovorin + Avastin versus Stivarga.  Oncologically, she denies any new complaints since our last encounter 2 weeks ago.   Past Medical History  Diagnosis Date  . Colon cancer     liver ca  . HTN (hypertension)   . Thyroid condition   . Diabetes mellitus   . Shingles   . Acute UTI   .  Nausea 02/06/2012  . Port catheter in place 08/15/2012    has Colon adenocarcinoma; Thyroid condition; Diabetes type 2, uncontrolled; HTN (hypertension), benign; and Port catheter in place on her problem list.     has No Known Allergies.  [name] had no medications administered during this visit.  Past Surgical History  Procedure Laterality Date  . Colectomy      and bil.  oopherectomy  . Appendectomy    . Blt    . Resection liver partial / total w/ radiofrequency ablation    . Port-a-cath removal      has new port now  . Portacath placement  09/2010  . Oophorectomy Bilateral 2007  . Cataract surgery Right 01/10/13  . Cataract surgery Left 01/24/13    Denies any headaches, dizziness, double vision, fevers, chills, night sweats, nausea, vomiting, diarrhea, constipation, chest pain, heart palpitations, shortness of breath, blood in stool, black tarry stool, urinary pain, urinary burning, urinary frequency, hematuria.   PHYSICAL EXAMINATION  ECOG PERFORMANCE STATUS: 1 - Symptomatic but completely ambulatory  Filed Vitals:   11/05/13 1007  BP: 128/62  Pulse: 85  Temp: 97.8 F (36.6 C)  Resp: 16    GENERAL:alert, no distress, well nourished, well developed, comfortable, cooperative and smiling SKIN: skin color, texture, turgor are normal, no rashes or significant lesions HEAD: Normocephalic, No masses, lesions, tenderness or abnormalities EYES: normal, PERRLA, EOMI, Conjunctiva are pink and non-injected EARS: External ears normal OROPHARYNX:mucous membranes are moist  NECK: supple, trachea midline LYMPH:  not examined BREAST:not examined LUNGS: not examined HEART: not examined ABDOMEN:not examined BACK: Back symmetric, no curvature. EXTREMITIES:no cyanosis  NEURO: alert & oriented x 3 with fluent speech, no focal motor/sensory deficits   LABORATORY DATA: CBC    Component Value Date/Time   WBC 8.4 10/29/2013 1012   RBC 4.11 10/29/2013 1012   HGB 12.7 10/29/2013 1012   HCT 38.5 10/29/2013 1012   PLT 284 10/29/2013 1012   MCV 93.7 10/29/2013 1012   MCH 30.9 10/29/2013 1012   MCHC 33.0 10/29/2013 1012   RDW 13.4 10/29/2013 1012   LYMPHSABS 1.7 10/29/2013 1012   MONOABS 0.6 10/29/2013 1012   EOSABS 0.4 10/29/2013 1012   BASOSABS 0.1 10/29/2013 1012      Chemistry      Component Value Date/Time   NA 138 10/29/2013 1012   K 4.0  10/29/2013 1012   CL 99 10/29/2013 1012   CO2 27 10/29/2013 1012   BUN 9 10/29/2013 1012   CREATININE 0.65 10/29/2013 1012      Component Value Date/Time   CALCIUM 9.3 10/29/2013 1012   ALKPHOS 72 10/29/2013 1012   AST 18 10/29/2013 1012   ALT 15 10/29/2013 1012   BILITOT 0.4 10/29/2013 1012     Lab Results  Component Value Date   CEA 277.0* 10/29/2013     RADIOGRAPHIC STUDIES:  Ct Head W Wo Contrast  11/29/2013   CLINICAL DATA:  Head and neck pain with nausea for 1-2 weeks. Colon cancer. Rising CEA.  EXAM: CT HEAD WITHOUT AND WITH CONTRAST  TECHNIQUE: Contiguous axial images were obtained from the base of the skull through the vertex without and with intravenous contrast  CONTRAST:  134m OMNIPAQUE IOHEXOL 300 MG/ML  SOLN  COMPARISON:  CT chest abdomen and pelvis reported separately.  FINDINGS: No evidence for acute infarction, hemorrhage, mass lesion, hydrocephalus, or extra-axial fluid. Mild cerebral and cerebellar atrophy. Mild white matter hypoattenuation, likely chronic microvascular ischemic change. No large vessel infarct. Post  infusion, there is no abnormal enhancement of the brain or meninges.  The calvarium is intact. There is no sinus or mastoid disease. Major dural venous sinuses appear patent.  IMPRESSION: No acute intracranial findings. No evidence for intracranial metastatic disease.   Electronically Signed   By: Rolla Flatten M.D.   On: 11/04/2013 10:27   Ct Chest W Contrast  11/04/2013   CLINICAL DATA:  Pain in right side of upper abdomen for 1 month. History of colon cancer.  EXAM: CT CHEST, ABDOMEN, AND PELVIS WITH CONTRAST  TECHNIQUE: Multidetector CT imaging of the chest, abdomen and pelvis was performed following the standard protocol during bolus administration of intravenous contrast.  CONTRAST:  16m OMNIPAQUE IOHEXOL 300 MG/ML  SOLN  COMPARISON:  PET-CT from 04/19/2012  FINDINGS: CT CHEST FINDINGS  Mediastinum: The heart size appears normal. The ascending thoracic  aorta measures 3.9 cm in diameter. No pericardial effusion. The trachea appears patent and is midline. There is a moderate hiatal hernia. The esophagus is otherwise normal. No mediastinal or hilar adenopathy. No axillary or supraclavicular adenopathy.  Lungs/Pleura: There is no pleural effusion. There are multifocal pulmonary nodules identified within both lungs. Index nodule in the left upper lobe measure 1.7 cm, image 25/ series 4. Previously 0.5 cm. Index nodule within the right lung apex measures 1.2 cm, image 13/series 4. Previously 4 mm. New subpleural nodule in the right lower lobe measures 7 mm, image 31/series 4.  Musculoskeletal: Review of the visualized osseous structures is negative for aggressive lytic or sclerotic bone lesion.  CT ABDOMEN AND PELVIS FINDINGS  Hepatobiliary: Large metastasis within the right lobe of liver measures 5.5 x 7.0 cm, image 42/series 2. Previously this lesion measured approximately 1.4 cm, image 42/series 2 peer the gallbladder appears normal. No biliary dilatation.  Pancreas: Normal appearance of the pancreas.  Spleen: The spleen is normal.  Adrenals/Urinary Tract: The adrenal glands are both unremarkable. Normal appearance of the kidneys. The urinary bladder appears within normal limits.  Stomach/Bowel: Moderate to large hiatal hernia noted. The small bowel loops have a normal course and caliber. No obstruction. Normal appearance of the colon.  Vascular/Lymphatic: Calcified atherosclerotic disease involves a normal caliber abdominal aorta. Retrocaval adenopathy measures 2.1 cm, image 60/series 2. Previously 0.9 cm. No enlarged periaortic lymph nodes. No pelvic or inguinal adenopathy.  Reproductive: The uterus and the adnexal structures are unremarkable for patient's age.  Other: Peritoneal nodule within the right abdomen measures 1.3 cm, image 79/series 2. Previously this measured 4 mm. Soft tissue attenuating nodule within the ventral abdominal wall measures 2.3 cm, image  91/series 2. Previously this may 1 cm.  Musculoskeletal: Review of the visualized osseous structures is unremarkable. There are no aggressive lytic or sclerotic bone lesions identified.  IMPRESSION: 1. Interval progression of disease within the chest, abdomen. 2. Progression of pulmonary metastasis. 3. Increase in size of right hepatic lobe metastasis. 4. Progression of retroperitoneal adenopathy and peritoneal metastasis.   Electronically Signed   By: TKerby MoorsM.D.   On: 11/04/2013 10:38   Ct Abdomen Pelvis W Contrast  11/04/2013   CLINICAL DATA:  Pain in right side of upper abdomen for 1 month. History of colon cancer.  EXAM: CT CHEST, ABDOMEN, AND PELVIS WITH CONTRAST  TECHNIQUE: Multidetector CT imaging of the chest, abdomen and pelvis was performed following the standard protocol during bolus administration of intravenous contrast.  CONTRAST:  1029mOMNIPAQUE IOHEXOL 300 MG/ML  SOLN  COMPARISON:  PET-CT from 04/19/2012  FINDINGS: CT CHEST  FINDINGS  Mediastinum: The heart size appears normal. The ascending thoracic aorta measures 3.9 cm in diameter. No pericardial effusion. The trachea appears patent and is midline. There is a moderate hiatal hernia. The esophagus is otherwise normal. No mediastinal or hilar adenopathy. No axillary or supraclavicular adenopathy.  Lungs/Pleura: There is no pleural effusion. There are multifocal pulmonary nodules identified within both lungs. Index nodule in the left upper lobe measure 1.7 cm, image 25/ series 4. Previously 0.5 cm. Index nodule within the right lung apex measures 1.2 cm, image 13/series 4. Previously 4 mm. New subpleural nodule in the right lower lobe measures 7 mm, image 31/series 4.  Musculoskeletal: Review of the visualized osseous structures is negative for aggressive lytic or sclerotic bone lesion.  CT ABDOMEN AND PELVIS FINDINGS  Hepatobiliary: Large metastasis within the right lobe of liver measures 5.5 x 7.0 cm, image 42/series 2. Previously this  lesion measured approximately 1.4 cm, image 42/series 2 peer the gallbladder appears normal. No biliary dilatation.  Pancreas: Normal appearance of the pancreas.  Spleen: The spleen is normal.  Adrenals/Urinary Tract: The adrenal glands are both unremarkable. Normal appearance of the kidneys. The urinary bladder appears within normal limits.  Stomach/Bowel: Moderate to large hiatal hernia noted. The small bowel loops have a normal course and caliber. No obstruction. Normal appearance of the colon.  Vascular/Lymphatic: Calcified atherosclerotic disease involves a normal caliber abdominal aorta. Retrocaval adenopathy measures 2.1 cm, image 60/series 2. Previously 0.9 cm. No enlarged periaortic lymph nodes. No pelvic or inguinal adenopathy.  Reproductive: The uterus and the adnexal structures are unremarkable for patient's age.  Other: Peritoneal nodule within the right abdomen measures 1.3 cm, image 79/series 2. Previously this measured 4 mm. Soft tissue attenuating nodule within the ventral abdominal wall measures 2.3 cm, image 91/series 2. Previously this may 1 cm.  Musculoskeletal: Review of the visualized osseous structures is unremarkable. There are no aggressive lytic or sclerotic bone lesions identified.  IMPRESSION: 1. Interval progression of disease within the chest, abdomen. 2. Progression of pulmonary metastasis. 3. Increase in size of right hepatic lobe metastasis. 4. Progression of retroperitoneal adenopathy and peritoneal metastasis.   Electronically Signed   By: Kerby Moors M.D.   On: 11/04/2013 10:38       ASSESSMENT:  1. 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 She is status post RFA ablation initially of single liver lesion in August 2012 followed by 6 cycles of FOLFOX/bevacizumab. That was completed on 04/03/2011. She then had a rising CEA level with recurrent disease in the liver and probably in the lung with small  non-hypermetabolic pulmonary nodules. Therefore, she was started on FOLFIRI + Avastin from 01/16/2012- 04/08/2012 at which time her quality of life was declining and she requested a break from therapy. Progression of disease is noted on CT scans on 11/04/2013.  She is considering palliative treatment options. 2. Difficulty sleeping.  3. Liver not palpable today  4. 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 1 CM in diameter. 5. Neck and back of head pain 6. Headaches 7. Nausea without vomiting 8. Increased tiredness 9. RUQ tenderness 10. Muscle tenderness/tightness of neck and upper back.  Patient Active Problem List   Diagnosis Date Noted  . Port catheter in place 08/15/2012  . Diabetes type 2, uncontrolled 04/13/2012  . HTN (hypertension), benign 04/13/2012  . Thyroid condition   . Colon adenocarcinoma 08/23/2010    Class: Diagnosis of  PLAN:  1. I personally reviewed and went over laboratory results with the patient.  The results are noted within this dictation. 2. I personally reviewed and went over radiographic studies with the patient.  The results are noted within this dictation.   3. Discussed treatment options:  A. Continue surveillance with symptom management  B. 5FU/Leucovorin + Avastin IV Every 14 day  C. Xeloda + Avastin every 21 days (14 days on and 7 days off)  D. Stivarga 80 mg 21 days on and 7 days off every 28 days (Max dose is 160 mg). 4. Patient education regarding malignancy. 5. Patient education regarding CEA 6. Patient education on the role of palliative treatment. 7. Patient will call the clinic in 1 week with her treatment choice.   8. Chemotherapy teraching in the near future based upon her treatment decision. 9. Chemotherapy labs to be ordered and scheduled in the future per patient's treatment choice. 10. Return in 3 weeks for follow-up for tolerability of chosen treatment   THERAPY PLAN:  She was provided treatment options as  outlined above.  She will call us with her decision and we will get her set-up for chemotherapy teaching accordingly.  All questions were answered. The patient knows to call the clinic with any problems, questions or concerns. We can certainly see the patient much sooner if necessary.  Patient and plan discussed with Dr. Farrel Gobble and he is in agreement with the aforementioned.   Druanne Bosques 11/05/2013

## 2013-11-04 ENCOUNTER — Other Ambulatory Visit (HOSPITAL_COMMUNITY): Payer: Medicare Other

## 2013-11-04 ENCOUNTER — Ambulatory Visit (HOSPITAL_COMMUNITY)
Admission: RE | Admit: 2013-11-04 | Discharge: 2013-11-04 | Disposition: A | Discharge status: Home / Self Care | Payer: Medicare Other | Source: Ambulatory Visit | Attending: Oncology | Admitting: Oncology

## 2013-11-04 DIAGNOSIS — R51 Headache: Secondary | ICD-10-CM | POA: Diagnosis not present

## 2013-11-04 DIAGNOSIS — C189 Malignant neoplasm of colon, unspecified: Secondary | ICD-10-CM

## 2013-11-04 DIAGNOSIS — R110 Nausea: Secondary | ICD-10-CM | POA: Insufficient documentation

## 2013-11-04 DIAGNOSIS — M542 Cervicalgia: Secondary | ICD-10-CM | POA: Diagnosis not present

## 2013-11-04 MED ORDER — IOHEXOL 300 MG/ML  SOLN
100.0000 mL | Freq: Once | INTRAMUSCULAR | Status: AC | PRN
  Administered 2013-11-04: 100 mL via INTRAVENOUS

## 2013-11-05 ENCOUNTER — Encounter (HOSPITAL_COMMUNITY): Payer: Medicare Other | Attending: Internal Medicine | Admitting: Oncology

## 2013-11-05 ENCOUNTER — Encounter (HOSPITAL_COMMUNITY): Payer: Self-pay | Admitting: Oncology

## 2013-11-05 VITALS — BP 128/62 | HR 85 | Temp 97.8°F | Resp 16 | Wt 177.5 lb

## 2013-11-05 DIAGNOSIS — R110 Nausea: Secondary | ICD-10-CM

## 2013-11-05 DIAGNOSIS — R51 Headache: Secondary | ICD-10-CM

## 2013-11-05 DIAGNOSIS — C19 Malignant neoplasm of rectosigmoid junction: Secondary | ICD-10-CM

## 2013-11-05 DIAGNOSIS — C189 Malignant neoplasm of colon, unspecified: Secondary | ICD-10-CM

## 2013-11-05 DIAGNOSIS — Z9889 Other specified postprocedural states: Secondary | ICD-10-CM | POA: Insufficient documentation

## 2013-11-05 DIAGNOSIS — C787 Secondary malignant neoplasm of liver and intrahepatic bile duct: Secondary | ICD-10-CM

## 2013-11-05 NOTE — Patient Instructions (Addendum)
[place] Discharge Instructions  RECOMMENDATIONS MADE BY THE CONSULTANT AND ANY TEST RESULTS WILL BE SENT TO YOUR REFERRING PHYSICIAN.  Discussed treatment options:  A. Continue no therapy and just follow along  B. 5FU/Leucovorin + Avastin which is IV and given every 14 days with pump x 2 days  C. Xeloda and Avastin.  We remember the problems you had with Xeloda in past.  This may not be your preferred option, nevertheless, it is an option for you.  D. Stivarga which is a pill.  Take every day for 3 week, and then a 1 week break.  Repeat it every 28 days (4 weeks) Call us back in 1 week with your decision.  Call Mickie Kay (Nurse) with your treatment decision.  [phone] or [phone] We will need to set-up chemo teaching with Lupita Raider before starting any medication.  Thank you for choosing [facility] to provide your oncology and hematology care.  To afford each patient quality time with our providers, please arrive at least 15 minutes before your scheduled appointment time.  With your help, our goal is to use those 15 minutes to complete the necessary work-up to ensure our physicians have the information they need to help with your evaluation and healthcare recommendations.    Effective January 1st, 2014, we ask that you re-schedule your appointment with our physicians should you arrive 10 or more minutes late for your appointment.  We strive to give you quality time with our providers, and arriving late affects you and other patients whose appointments are after yours.    Again, thank you for choosing [facility].  Our hope is that these requests will decrease the amount of time that you wait before being seen by our physicians.       _____________________________________________________________  Should you have questions after your visit to [facility], please contact our office at [phone] between the hours of 8:30 a.m.  and 5:00 p.m.  Voicemails left after 4:30 p.m. will not be returned until the following business day.  For prescription refill requests, have your pharmacy contact our office with your prescription refill request.

## 2013-11-10 LAB — MICROALBUMIN, URINE

## 2013-11-12 ENCOUNTER — Other Ambulatory Visit (HOSPITAL_COMMUNITY): Payer: Self-pay | Admitting: Oncology

## 2013-11-12 ENCOUNTER — Telehealth (HOSPITAL_COMMUNITY): Payer: Self-pay

## 2013-11-12 DIAGNOSIS — C189 Malignant neoplasm of colon, unspecified: Secondary | ICD-10-CM

## 2013-11-12 MED ORDER — REGORAFENIB 40 MG PO TABS: 80.0000 mg | ORAL_TABLET | Freq: Every day | ORAL | Status: DC

## 2013-11-12 MED ORDER — REGORAFENIB 40 MG PO TABS: ORAL_TABLET | ORAL | Status: DC

## 2013-11-12 NOTE — Telephone Encounter (Signed)
Call from [facility] stating "I guess I'm ready to restart chemotherapy with the [place].  Just let me know when I need to come for teaching and anything else I need to do."

## 2013-11-17 ENCOUNTER — Telehealth (HOSPITAL_COMMUNITY): Payer: Self-pay | Admitting: *Deleted

## 2013-11-17 NOTE — Telephone Encounter (Signed)
Today I did chemo teaching with patient over the phone regarding the new oral agent she will be taking called Stivarga. I also mailed patient a teaching sheet on [place] today. When patient comes in on 11/25 for office visit I will give her a Stivarga Patient Starter Kit and give her the chemo teaching handouts personally. I will review whatever questions she may have. I will also make her a calendar when she lets me know the actual start date of her [place]. Patient made notes as we talked on the phone.

## 2013-11-19 ENCOUNTER — Encounter (HOSPITAL_COMMUNITY): Payer: Medicare Other

## 2013-11-19 DIAGNOSIS — C189 Malignant neoplasm of colon, unspecified: Secondary | ICD-10-CM

## 2013-11-19 NOTE — Patient Instructions (Addendum)
[facility]   CHEMOTHERAPY INSTRUCTIONS  What is the most important information I should know about Stivarga?  Stivarga can cause serious side effects, including:  Liver problems. Stivarga can cause liver problems which can be serious and sometimes lead to death. Your healthcare provider will do blood tests to check your liver function before you start taking Stivarga and during your treatment with Stivarga to check for liver problems.   Tell your healthcare provider right away if you get any of these symptoms of liver problems during treatment:      . yellowing of your skin or the white part of your eyes (jaundice) . nausea or vomiting . dark "tea-colored" urine . change in sleep pattern  What is Stivarga? [name] is a prescription medicine used to treat people with:  . colon or rectal cancer that has spread to other parts of the body and for which they have received previous treatment with certain chemotherapy medicines. Marland Kitchen a rare stomach, bowel, or esophagus cancer called GIST (gastrointestinal stromal tumors) that cannot be treated with surgery or that has spread to other parts of the body and for which they have received previous treatment with certain medicines.   Stivarga has not been used to treat children less than 37 years of age.  What should I tell my healthcare provider before taking Stivarga?  Before you take Stivarga, tell your healthcare provider if you:  . have liver problems . have bleeding problems . have high blood pressure . have heart problems or chest pain . plan to have any surgical procedures . have any other medical conditions . are pregnant or plan to become pregnant. Stivarga can harm your unborn baby. Females and males should use effective birth control during treatment with Stivarga and for 2   months after your last dose of Stivarga. Tell your healthcare provider right away if you or your partner becomes pregnant  either while taking Stivarga or within 2 months after your last dose of Stivarga. Marland Kitchen are breastfeeding or plan to breastfeed. It is not known if Stivarga passes into your breast milk. You and your healthcare provider should decide if you will take [name] or breastfeed.  Tell your healthcare provider about all the medicines you take, including prescription and non-prescription medicines, vitamins and herbal supplements. Stivarga may affect the way other medicines work, and other medicines may affect how [name] works. Know the medicines you take. Keep a list of your medicines and show it to your healthcare provider and pharmacist when you get a new medicine.  How should I take Stivarga? . Take Stivarga exactly as your healthcare provider tells you. Marland Kitchen You will usually take Stivarga 1 time a day for 21 days (3 weeks) and then stop for 7 days (1week). This is 1 cycle of treatment. Repeat this cycle for as long as your healthcare provider tells you to. [name] Stivarga tablets whole with water after a low-fat meal. . Take Stivarga at the same time each day with a low-fat meal that contains less than 600 calories and less than 30% fat. . Your healthcare provider may stop your treatment or change the dose of your treatment if you get side effects. . If you miss a dose, take it as soon as you remember on that day. Do not take two doses on the same day to make up for a missed dose. . If you take too much Stivarga call your healthcare provider or go to the nearest  emergency room right away.  What should I avoid while taking Stivarga?  Marland Kitchen Avoid drinking grapefruit juice and taking St. John's Wort while taking Stivarga. These can affect the way Stivarga works.  What are the possible side effects of Stivarga?  Stivarga can cause serious side effects including:  . severe bleeding. Stivarga can cause bleeding which can be serious and sometimes lead to death. Tell your healthcare provider if you have any  signs of bleeding while taking Stivarga including:  . vomiting blood or if your vomit looks like coffee-grounds . pink or brown urine . red or black (looks like tar) stools . coughing up blood or blood clots . menstrual bleeding that is heavier than normal . unusual vaginal bleeding . nose bleeds that happen often  . a skin problem called hand-foot skin reaction and severe skin rash.  Hand-foot skin reactions can cause redness, pain, blisters, bleeding, or swelling on the palms of your hands or soles of your feet. If you get this side effect or a severe skin rash, your healthcare provider may stop your treatment for some time.  . high blood pressure. Your blood pressure should be checked every week for the first 6 weeks of starting Stivarga. Your blood pressure should be checked regularly and any high blood pressure should be treated while you are receiving Stivarga. Tell your healthcare provider if you have severe headaches, lightheadedness, or changes in your vision.  Marland Kitchen decreased blood flow to the heart and heart attack. Get emergency help right away and call your healthcare provider if you get symptoms such as chest pain, shortness of breath, feel dizzy or feel like passing out.  Marland Kitchen a condition called Reversible Posterior Leukoencephalopathy Syndrome (RPLS). Call your healthcare provider right away if you get: severe headaches, seizure, confusion, change in vision, or problems thinking.  Marland Kitchen a tear in your stomach or intestinal wall (bowel perforation). Stivarga may cause a tear in your stomach or bowel perforation that can be serious and sometimes lead to death. Tell your healthcare provider right away if you get: . severe pain in your stomach-area (abdomen) . swelling of the abdomen . high fever  . wound healing problems. If you need to have a surgical procedure, tell your healthcare provider that you are taking Stivarga. You should stop taking Stivarga at least 2 weeks before any planned  surgery.  The most common side effects of Stivarga include: . tiredness, weakness, fatigue . frequent or loose bowel movements (diarrhea) . loss of appetite . swelling, pain and redness of the lining in your mouth, throat, stomach and bowel (mucositis) . voice changes or hoarseness . infection . pain in other parts of your body . weight loss . nausea Tell your healthcare provider if you have any side effect that bothers you or that does not go away. These are not all of the possible side effects of Stivarga. For more information, ask your healthcare provider or pharmacist. Call your doctor for medical advice about side effects. You may report side effects to FDA at 1- 800-FDA-1088.  How do I store [place]? . Store Stivarga tablets at room temperature between 68 F to 77 F (20 C to 25 C). Marland Kitchen Keep Stivarga in the bottle that it comes in. Do not put Stivarga tablets in a daily or weekly pill box. . The Stivarga bottle contains a desiccant to help keep your medicine dry. Keep the desiccant in the bottle. Marland Kitchen Keep the bottle of Stivarga tightly closed. . Safely throw away (  discard) any unused Stivarga tablets after 7 weeks of opening the bottle. Keep Stivarga and all medicines out of the reach of children.  General information about Stivarga. Medicines are sometimes prescribed for purposes other than those listed in a Patient Information leaflet. Do not use Stivarga for a condition for which it was not prescribed. Do not give Stivarga to other people even if they have the same symptoms you have. It may harm them.  This leaflet summarizes the most important information about Stivarga. If you would like more information, talk with your healthcare provider. You can ask your healthcare provider or pharmacist for information about [name] that is written for health professionals.  For more information, go to [email or web address] or call [phone].  What are the ingredients in  [place]? Active ingredient: regorafenib Inactive ingredients: cellulose microcrystalline, croscarmellose sodium, magnesium stearate,povidone and colloidal silicon dioxide. Film coat: ferric oxide red, ferric oxide yellow, lecithin (soy), polyethylene glycol 3350,polyvinyl alcohol, talc and titanium dioxide. This Patient Information has been approved by the U.S. Food and Drug Administration.   EDUCATIONAL MATERIALS GIVEN AND REVIEWED: Specific Instructions Sheets: Stivarga   SELF CARE ACTIVITIES WHILE ON CHEMOTHERAPY: Increase your fluid intake 48 hours prior to treatment and drink at least 2 quarts per day after treatment., No alcohol intake., No aspirin or other medications unless approved by your oncologist., Eat foods that are light and easy to digest., Eat foods at cold or room temperature., No fried, fatty, or spicy foods immediately before or after treatment., Have teeth cleaned professionally before starting treatment. Keep dentures and partial plates clean., Use soft toothbrush and do not use mouthwashes that contain alcohol. Biotene is a good mouthwash that is available at most pharmacies or may be ordered by calling [phone]., Use warm salt water gargles (1 teaspoon salt per 1 quart warm water) before and after meals and at bedtime. Or you may rinse with 2 tablespoons of three -percent hydrogen peroxide mixed in eight ounces of water., Always use sunscreen with SPF (Sun Protection Factor) of 30 or higher., Use your nausea medication as directed to prevent nausea. and Use your stool softener or laxative as directed to prevent constipation.  Please wash your hands for at least 30 seconds using warm soapy water. Handwashing is the #1 way to prevent the spread of germs. Stay away from sick people or people who are getting over a cold. If you develop respiratory systems such as green/yellow mucus production or productive cough or persistent cough let us know and we will see if you need an  antibiotic. It is a good idea to keep a pair of gloves on when going into grocery stores/[employer] to decrease your risk of coming into contact with germs on the carts, etc. Carry alcohol hand gel with you at all times and use it frequently if out in public. All foods need to be cooked thoroughly. No raw foods. No medium or undercooked meats, eggs. If your food is cooked medium well, it does not need to be hot pink or saturated with bloody liquid at all. Vegetables and fruits need to be washed/rinsed under the faucet with a dish detergent before being consumed. You can eat raw fruits and vegetables unless we tell you otherwise but it would be best if you cooked them or bought frozen. Do not eat off of salad bars or hot bars unless you really trust the cleanliness of the restaurant. If you need dental work, please let us know before you go for your appointment so  that we can coordinate the best possible time for you in regards to your chemo regimen. You need to also let your dentist know that you are actively taking chemo. We may need to do labs prior to your dental appointment. We also want your bowels moving at least every other day. If this is not happening, we need to know so that we can get you on a bowel regimen to help you go.     MEDICATIONS: You have been given prescriptions for the following medications:  Over-the-Counter Meds:  Colace - this is a stool softener. Take 100mg  capsule 2-6 times a day as needed. If you have to take more than 6 capsules of Colace a day call the [facility].  Senna - this is a mild laxative used to treat mild constipation. May take 2 tabs by mouth daily or up to twice a day as needed for mild constipation,   Milk of Magnesia - this is a laxative used to treat moderate to severe constipation. May take 2-4 tablespoons every 8 hours as needed. May increase to 8 tablespoons x 1 dose and if no bowel movement call the [facility].  Imodium - this is for diarrhea. Take  2 tabs after 1st loose stool and then 1 tab after each loose stool until you go a total of 12 hours without a loose stool. Call [facility] if loose stools continue.   SYMPTOMS TO REPORT AS SOON AS POSSIBLE AFTER TREATMENT:  FEVER GREATER THAN 100.5 F  CHILLS WITH OR WITHOUT FEVER  NAUSEA AND VOMITING THAT IS NOT CONTROLLED WITH YOUR NAUSEA MEDICATION  UNUSUAL SHORTNESS OF BREATH  UNUSUAL BRUISING OR BLEEDING  TENDERNESS IN MOUTH AND THROAT WITH OR WITHOUT PRESENCE OF ULCERS  URINARY PROBLEMS  BOWEL PROBLEMS  UNUSUAL RASH    Wear comfortable clothing and clothing appropriate for easy access to any Portacath or PICC line. Let us know if there is anything that we can do to make your therapy better!      I have been informed and understand all of the instructions given to me and have received a copy. I have been instructed to call the clinic [phone] or my family physician as soon as possible for continued medical care, if indicated. I do not have any more questions at this time but understand that I may call the [facility] or the Patient Navigator at [phone] during office hours should I have questions or need assistance in obtaining follow-up care.         Regorafenib tablets What is this medicine? REGORAFENIB (RE goe RAF e nib) is a chemotherapy drug. It targets specific proteins within cancer cells and stops the cancer cells from growing. This medicine is used to treat colorectal cancer. It is also used to treat specific digestive tract tumors called GISTs. This medicine may be used for other purposes; ask your health care provider or pharmacist if you have questions. COMMON BRAND NAME(S): Stivarga What should I tell my health care provider before I take this medicine? They need to know if you have any of these conditions: -bleeding disorders -heart disease -high blood pressure -liver disease -recent surgery -an unusual or allergic reaction to  regorafenib, other medicines, foods, dyes, or preservatives -pregnant or trying to get pregnant -breast-feeding How should I use this medicine? Take this medicine by mouth with a glass of water. Follow the directions on the prescription label. Do not take it more often than directed. Take this medicine  with food. Do not take with grapefruit juice. Do not stop taking except on your doctor's advice. Talk to your pediatrician regarding the use of this medicine in children. Special care may be needed. Overdosage: If you think you've taken too much of this medicine contact a poison control center or emergency room at once. Overdosage: If you think you have taken too much of this medicine contact a poison control center or emergency room at once. NOTE: This medicine is only for you. Do not share this medicine with others. What if I miss a dose? If you miss a dose, take it as soon as you can. If it is almost time for your next dose, take only that dose. Do not take double or extra doses. What may interact with this medicine? This medicine may interact with the following: -carbamazepine -irinotecan -itraconazole -ketoconazole -phenobarbital -phenytoin -posaconazole -rifampin -St. John's Wort -telithromycin -voriconazole -warfarin This list may not describe all possible interactions. Give your health care provider a list of all the medicines, herbs, non-prescription drugs, or dietary supplements you use. Also tell them if you smoke, drink alcohol, or use illegal drugs. Some items may interact with your medicine. What should I watch for while using this medicine? This drug may make you feel generally unwell. This is not uncommon, as chemotherapy can affect healthy cells as well as cancer cells. Report any side effects. Continue your course of treatment even though you feel ill unless your doctor tells you to stop. You may need blood work done while you are taking this medicine. Do not become  pregnant while taking this medicine or for 2 months after stopping it. Women should inform their doctor if they wish to become pregnant or think they might be pregnant. Men should not father a child while taking this medicine and for 2 months after stopping it. There is a potential for serious side effects to an unborn child. Talk to your health care professional or pharmacist for more information. Do not breast-feed an infant while taking this medicine. If you are going to have surgery or any other procedures, tell your doctor you are taking this medicine. Talk to your doctor about your risk of cancer. You may be more at risk for certain types of cancers if you take this medicine. What side effects may I notice from receiving this medicine? Side effects that you should report to your doctor or health care professional as soon as possible: -allergic reactions like skin rash, itching or hives, swelling of the face, lips, or tongue -bloody or black, tarry stools -breathing problems -changes in vision -chest pain or chest tightness -confusion -dizziness -feeling faint or lightheaded -high fever -light-colored stools -nausea, vomiting -red or dark-brown urine -red spots on the skin -right upper belly pain -seizures -severe headache -sores on the hands or feet -spitting up blood or brown material that looks like coffee grounds -stomach pain -unusual bruising or bleeding from the eye, gums, or nose -unusually weak or tired -yellowing of the eyes or skin Side effects that usually do not require medical attention (Report these to your doctor or health care professional if they continue or are bothersome.): -diarrhea -hoarseness -loss of appetite -sore throat -tiredness -weight loss This list may not describe all possible side effects. Call your doctor for medical advice about side effects. You may report side effects to FDA at 1-800-FDA-1088. Where should I keep my medicine? Keep out of  the reach of children. Store between 20 and 25 degrees C (68  and 77 degrees F). Keep this medicine in the original container. Throw away any unused medicine after the expiration date. NOTE: This sheet is a summary. It may not cover all possible information. If you have questions about this medicine, talk to your doctor, pharmacist, or health care provider.  2015, [employer]. (2011-02-28 16:45:09)

## 2013-11-20 ENCOUNTER — Telehealth (HOSPITAL_COMMUNITY): Payer: Self-pay | Admitting: *Deleted

## 2013-11-20 NOTE — Telephone Encounter (Signed)
Patient started her Stivarga medication this am. No problems thus far. Pt instructed to call us if there are any problems. She verbalized understanding.

## 2013-11-21 ENCOUNTER — Telehealth (HOSPITAL_COMMUNITY): Payer: Self-pay | Admitting: *Deleted

## 2013-11-21 NOTE — Telephone Encounter (Signed)
Patient doing ok today but did complain of dry mouth today and feeling a little tired. I instructed patient to keep using the Biotene and suck on sugar free candy to assist with easing the dry mouth. I also instructed patient to move around some such as walking to help with feeling a little tired. Patient thinks she is doing ok however. I also instructed pt to keep drinking fluids. She said ok.

## 2013-11-23 NOTE — Progress Notes (Signed)
-  Rescheduled-  [name]  

## 2013-11-25 ENCOUNTER — Telehealth (HOSPITAL_COMMUNITY): Payer: Self-pay | Admitting: *Deleted

## 2013-11-25 NOTE — Telephone Encounter (Signed)
Patient instructed to restart Norvasc 5mg  daily (per Dr. Barnet Glasgow) and to report BP readings if they do not improve or if they get worse. Patient will continue taking Stivarga also. She verbalized understanding of all instructions.

## 2013-11-25 NOTE — Telephone Encounter (Signed)
Patient has developed hoarseness in the mornings after taking the Stivarga. This started on Saturday. Hoarseness is gone by the evening. No fever, sore throat, drainage. Mouth continues to be dry for about 3-4 hours after taking Stivarga but then the dryness goes away. Pt has felt tired since taking this medication (since the first dose). Patient feels like she should be able to do a lot more than what she is currently doing but is unable to. Patient is in no way chair bound and is up and about 100% of waking hours. She has been checking her BP daily. A side effect of this medication is that it can increase BP. Her BP today was 152/85. Patient states that this is slightly elevated for her. Patient does have Norvasc 5mg  that she has at home but not currently taking because her BP leveled out & Dr. Edrick Oh took her off of it for now. She is on no other medications for hypertension.

## 2013-11-25 NOTE — Telephone Encounter (Signed)
I recommend she continue Stivarga as planned without dose adjustment yet. She is on 80 mg.  She may just need some time to acclimate to the new medication.  I will send this to Dr. Barnet Glasgow in the event that she wants to add any input.  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  KEFALAS,THOMAS 11/25/2013

## 2013-11-26 ENCOUNTER — Ambulatory Visit (HOSPITAL_COMMUNITY): Payer: Medicare Other | Admitting: Oncology

## 2013-11-26 ENCOUNTER — Inpatient Hospital Stay (HOSPITAL_COMMUNITY): Payer: Medicare Other

## 2013-11-29 NOTE — Progress Notes (Signed)
[name], MD [address] [place] [address]  Colon adenocarcinoma - Plan: TSH, heparin lock flush 100 unit/mL, sodium chloride 0.9 % injection 10 mL, regorafenib (STIVARGA) 40 MG tablet, DISCONTINUED: regorafenib (STIVARGA) 40 MG tablet  CURRENT THERAPY: Stivarga 80 mg daily x 21 days with 7 day respite, starting on 11/20/2013.  INTERVAL HISTORY: [name] 73 y.o. 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 She is status post RFA ablation initially of single liver lesion in August 2012 followed by 6 cycles of FOLFOX/bevacizumab. That was completed on 04/03/2011. She then had a rising CEA level with recurrent disease in the liver and probably in the lung with small non-hypermetabolic pulmonary nodules. Therefore, she was started on FOLFIRI + Avastin from 01/16/2012- 04/08/2012 at which time her quality of life was declining and she requested a break from therapy.     Colon adenocarcinoma   11/04/2013 Progression CT CAP and head- Interval progression of disease within the chest, abdomen. Progression of pulmonary metastasis. Increase in size of right hepatic lobe metastasis. Progression of retroperitoneal adenopathy and peritoneal adenopathy  metastasis   11/20/2013 -  Chemotherapy Stivarga 80 mg daily 21 days on and 7 days off   I personally reviewed and went over laboratory results with the patient.  The results are noted within this dictation.  Updated labs will be ascertained today.  Thus far she is tolerating Stivarga well with the following minor complaints: 1. Fatigue - occasional fatigue that is resolved with resting.  Not interfering with QOL. 2. Xerostomia- this is a side effect of Stivarga as this occurs in 5% of patients.  I recommend Biotene for dry mouth.  Additionally, we will change her medication to evening time 2 hours after a meal since this symptom occurs 3-4  hours after taking the medication. 3. Hoarseness- voice disorder occurs in 30-39% of patients who are taking Stivarga.  It does not interfere with QOL. 4. HTN- Stivarga can cause HTN in 30-59% of patients with grade 3 occuring 8-28%.  She recently restarted Norvasc 5 mg daily about 1 week ago.  She will continue that and in 2 weeks, we may need to increase to 10 mg daily.  "i am doing what I want to do when I want."  With this statement, it is clear that Stivarga therapy is not interfering with QOL.  Therefore we will continue with 80 mg.  Oncologically, she denies any complaints and ROS questioning is negative.  Past Medical History  Diagnosis Date  . Colon cancer     liver ca  . HTN (hypertension)   . Thyroid condition   . Diabetes mellitus   . Shingles   . Acute UTI   . Nausea 02/06/2012  . Port catheter in place 08/15/2012    has Colon adenocarcinoma; Thyroid condition; Diabetes type 2, uncontrolled; HTN (hypertension), benign; and Port catheter in place on her problem list.     has No Known Allergies.  We administered heparin lock flush and sodium chloride.  Past Surgical History  Procedure Laterality Date  . Colectomy      and bil. oopherectomy  . Appendectomy    . Blt    . Resection liver partial / total w/ radiofrequency ablation    . Port-a-cath removal      has new port now  . Portacath placement  09/2010  . Oophorectomy Bilateral 2007  . Cataract surgery  Right 01/10/13  . Cataract surgery Left 01/24/13    Denies any headaches, dizziness, double vision, fevers, chills, night sweats, nausea, vomiting, diarrhea, constipation, chest pain, heart palpitations, shortness of breath, blood in stool, black tarry stool, urinary pain, urinary burning, urinary frequency, hematuria.   PHYSICAL EXAMINATION  ECOG PERFORMANCE STATUS: 1 - Symptomatic but completely ambulatory  Filed Vitals:   12/04/13 1126  BP: 147/93  Pulse: 93  Temp: 97.6 F (36.4 C)  Resp: 20     GENERAL:alert, no distress, well nourished, well developed, comfortable, cooperative and smiling SKIN: skin color, texture, turgor are normal, no rashes or significant lesions HEAD: Normocephalic, No masses, lesions, tenderness or abnormalities EYES: normal, PERRLA, EOMI, Conjunctiva are pink and non-injected EARS: External ears normal OROPHARYNX:mucous membranes are moist  NECK: supple, no adenopathy, thyroid normal size, non-tender, without nodularity, no stridor, non-tender, trachea midline LYMPH:  no palpable lymphadenopathy BREAST:not examined LUNGS: clear to auscultation  HEART: regular rate & rhythm ABDOMEN:abdomen soft and normal bowel sounds BACK: Back symmetric, no curvature. EXTREMITIES:less then 2 second capillary refill, no clubbing, no cyanosis  NEURO: alert & oriented x 3 with fluent speech, no focal motor/sensory deficits, gait normal    LABORATORY DATA: CBC    Component Value Date/Time   WBC 7.9 12/04/2013 1226   RBC 4.78 12/04/2013 1226   HGB 14.9 12/04/2013 1226   HCT 43.2 12/04/2013 1226   PLT 213 12/04/2013 1226   MCV 90.4 12/04/2013 1226   MCH 31.2 12/04/2013 1226   MCHC 34.5 12/04/2013 1226   RDW 12.7 12/04/2013 1226   LYMPHSABS 2.1 12/04/2013 1226   MONOABS 0.4 12/04/2013 1226   EOSABS 0.2 12/04/2013 1226   BASOSABS 0.1 12/04/2013 1226      Chemistry      Component Value Date/Time   NA 135* 12/04/2013 1226   K 3.9 12/04/2013 1226   CL 97 12/04/2013 1226   CO2 24 12/04/2013 1226   BUN 12 12/04/2013 1226   CREATININE 0.65 12/04/2013 1226      Component Value Date/Time   CALCIUM 9.8 12/04/2013 1226   ALKPHOS 92 12/04/2013 1226   AST 20 12/04/2013 1226   ALT 17 12/04/2013 1226   BILITOT 0.8 12/04/2013 1226     Lab Results  Component Value Date   CEA 277.0* 10/29/2013      ASSESSMENT: 1. 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 She is status post RFA ablation initially of  single liver lesion in August 2012 followed by 6 cycles of FOLFOX/bevacizumab. That was completed on 04/03/2011. She then had a rising CEA level with recurrent disease in the liver and probably in the lung with small non-hypermetabolic pulmonary nodules. Therefore, she was started on FOLFIRI + Avastin from 01/16/2012- 04/08/2012 at which time her quality of life was declining and she requested a break from therapy. Progression of disease is noted on CT scans on 11/04/2013. She is considering palliative treatment options. 2. Difficulty sleeping.  3. Liver not palpable today  4. 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 1 CM in diameter. 5. Stivarga-induced fatigue 6. Stivarga induced Xerostomia 7. Stivarga induced hoarseness 8. Exacerbation of HTN, Stivarga-induced  Patient Active Problem List   Diagnosis Date Noted  . Port catheter in place 08/15/2012  . Diabetes type 2, uncontrolled 04/13/2012  . HTN (hypertension), benign 04/13/2012  . Thyroid condition   . Colon adenocarcinoma 08/23/2010    Class: Diagnosis of     PLAN:  1. I personally reviewed and went over laboratory results with the patient.  The results are noted within this dictation. 2. Continue Stivarga 80 mg daily x 21 days with 7 day respite. 3. Labs today: CBC diff, CMET 4. Return on 12/17 for labs: CBC diff, CMET, CEA, TSH 5. Recommended OTC Biotene for dry mouth 6. Patient education regarding side effects of Stivarga 7. Continue Stivarga 80 mg daily 3 weeks on and 1 week off.  Take medication in Evening 8. Return on 12/17 for follow-up and start of cycle 2.   THERAPY PLAN:  We will continue with Stivarga 80 mg daily 3 weeks on and 1 week off.  We will monitor labs.  We will restage her after 3rd or 4th cycle of chemotherapy.   All questions were answered. The patient knows to call the clinic with any problems, questions or concerns. We can certainly see the patient much sooner if  necessary.  Patient and plan discussed with Dr. Farrel Gobble and he is in agreement with the aforementioned.   Dason Mosley 12/04/2013

## 2013-12-04 ENCOUNTER — Encounter (HOSPITAL_COMMUNITY): Payer: Self-pay | Admitting: Oncology

## 2013-12-04 ENCOUNTER — Encounter (HOSPITAL_COMMUNITY): Payer: Medicare Other | Attending: Internal Medicine | Admitting: Oncology

## 2013-12-04 ENCOUNTER — Encounter (HOSPITAL_COMMUNITY): Payer: Self-pay

## 2013-12-04 ENCOUNTER — Encounter (HOSPITAL_COMMUNITY): Payer: Medicare Other

## 2013-12-04 VITALS — BP 147/93 | HR 93 | Temp 97.6°F | Resp 20 | Wt 172.2 lb

## 2013-12-04 DIAGNOSIS — Z9889 Other specified postprocedural states: Secondary | ICD-10-CM | POA: Diagnosis not present

## 2013-12-04 DIAGNOSIS — R911 Solitary pulmonary nodule: Secondary | ICD-10-CM

## 2013-12-04 DIAGNOSIS — C19 Malignant neoplasm of rectosigmoid junction: Secondary | ICD-10-CM

## 2013-12-04 DIAGNOSIS — C189 Malignant neoplasm of colon, unspecified: Secondary | ICD-10-CM

## 2013-12-04 DIAGNOSIS — I10 Essential (primary) hypertension: Secondary | ICD-10-CM

## 2013-12-04 DIAGNOSIS — C787 Secondary malignant neoplasm of liver and intrahepatic bile duct: Secondary | ICD-10-CM

## 2013-12-04 LAB — CBC WITH DIFFERENTIAL/PLATELET
Basophils Absolute: 0.1 10*3/uL (ref 0.0–0.1)
Basophils Relative: 1 % (ref 0–1)
EOS ABS: 0.2 10*3/uL (ref 0.0–0.7)
EOS PCT: 2 % (ref 0–5)
HCT: 43.2 % (ref 36.0–46.0)
Hemoglobin: 14.9 g/dL (ref 12.0–15.0)
LYMPHS ABS: 2.1 10*3/uL (ref 0.7–4.0)
Lymphocytes Relative: 26 % (ref 12–46)
MCH: 31.2 pg (ref 26.0–34.0)
MCHC: 34.5 g/dL (ref 30.0–36.0)
MCV: 90.4 fL (ref 78.0–100.0)
Monocytes Absolute: 0.4 10*3/uL (ref 0.1–1.0)
Monocytes Relative: 5 % (ref 3–12)
Neutro Abs: 5.2 10*3/uL (ref 1.7–7.7)
Neutrophils Relative %: 66 % (ref 43–77)
PLATELETS: 213 10*3/uL (ref 150–400)
RBC: 4.78 MIL/uL (ref 3.87–5.11)
RDW: 12.7 % (ref 11.5–15.5)
WBC: 7.9 10*3/uL (ref 4.0–10.5)

## 2013-12-04 LAB — COMPREHENSIVE METABOLIC PANEL
ALT: 17 U/L (ref 0–35)
ANION GAP: 14 (ref 5–15)
AST: 20 U/L (ref 0–37)
Albumin: 3.8 g/dL (ref 3.5–5.2)
Alkaline Phosphatase: 92 U/L (ref 39–117)
BUN: 12 mg/dL (ref 6–23)
CALCIUM: 9.8 mg/dL (ref 8.4–10.5)
CO2: 24 mEq/L (ref 19–32)
Chloride: 97 mEq/L (ref 96–112)
Creatinine, Ser: 0.65 mg/dL (ref 0.50–1.10)
GFR calc non Af Amer: 87 mL/min — ABNORMAL LOW (ref >90)
GLUCOSE: 183 mg/dL — AB (ref 70–99)
Potassium: 3.9 mEq/L (ref 3.7–5.3)
SODIUM: 135 meq/L — AB (ref 137–147)
TOTAL PROTEIN: 7.6 g/dL (ref 6.0–8.3)
Total Bilirubin: 0.8 mg/dL (ref 0.3–1.2)

## 2013-12-04 MED ORDER — REGORAFENIB 40 MG PO TABS
ORAL_TABLET | ORAL | Status: DC
Start: 2013-12-04 — End: 2013-12-12

## 2013-12-04 MED ORDER — HEPARIN SOD (PORK) LOCK FLUSH 100 UNIT/ML IV SOLN
500.0000 [IU] | Freq: Once | INTRAVENOUS | Status: AC
  Administered 2013-12-04: 500 [IU] via INTRAVENOUS

## 2013-12-04 MED ORDER — HEPARIN SOD (PORK) LOCK FLUSH 100 UNIT/ML IV SOLN
INTRAVENOUS | Status: AC
  Filled 2013-12-04: qty 5

## 2013-12-04 MED ORDER — SODIUM CHLORIDE 0.9 % IJ SOLN
10.0000 mL | INTRAMUSCULAR | Status: DC | PRN
  Administered 2013-12-04: 10 mL via INTRAVENOUS
  Filled 2013-12-04: qty 10

## 2013-12-04 MED ORDER — REGORAFENIB 40 MG PO TABS: ORAL_TABLET | ORAL | Status: DC

## 2013-12-04 NOTE — Progress Notes (Signed)
[name] presented for [employer]. Labs per MD order drawn via Portacath located in the left chest wall accessed with  H 20 needle. Good blood return present. Procedure without incident.  Needle removed intact. Patient tolerated procedure well.

## 2013-12-04 NOTE — Patient Instructions (Addendum)
[facility] Discharge Instructions  RECOMMENDATIONS MADE BY THE CONSULTANT AND ANY TEST RESULTS WILL BE SENT TO YOUR REFERRING PHYSICIAN.  EXAM FINDINGS BY THE PHYSICIAN TODAY AND SIGNS OR SYMPTOMS TO REPORT TO CLINIC OR PRIMARY PHYSICIAN:   Continue Stivarga at 80 mg daily for 21 days with a 1 week break every 28 days. Continue using Biotene products for dry mouth Change your treatment to evening 2-3 hours after supper. Labs today.  We will call you with results.  Labs in 2 weeks which will include a cancer marker and TSH Return in 2 weeks for follow-up and labs.  Please call the [facility] for any questions or concerns.   Thank you for choosing [facility] to provide your oncology and hematology care.  To afford each patient quality time with our providers, please arrive at least 15 minutes before your scheduled appointment time.  With your help, our goal is to use those 15 minutes to complete the necessary work-up to ensure our physicians have the information they need to help with your evaluation and healthcare recommendations.    Effective January 1st, 2014, we ask that you re-schedule your appointment with our physicians should you arrive 10 or more minutes late for your appointment.  We strive to give you quality time with our providers, and arriving late affects you and other patients whose appointments are after yours.    Again, thank you for choosing [facility].  Our hope is that these requests will decrease the amount of time that you wait before being seen by our physicians.       _____________________________________________________________  Should you have questions after your visit to [facility], please contact our office at [phone] between the hours of 8:30 a.m. and 4:30 p.m.  Voicemails left after 4:30 p.m. will not be returned until the following business day.  For prescription refill  requests, have your pharmacy contact our office with your prescription refill request.    _______________________________________________________________  We hope that we have given you very good care.  You may receive a patient satisfaction survey in the mail, please complete it and return it as soon as possible.  We value your feedback!  _______________________________________________________________  Have you asked about our STAR program?  STAR stands for Survivorship Training and Rehabilitation, and this is a nationally recognized cancer care program that focuses on survivorship and rehabilitation.  Cancer and cancer treatments may cause problems, such as, pain, making you feel tired and keeping you from doing the things that you need or want to do. Cancer rehabilitation can help. Our goal is to reduce these troubling effects and help you have the best quality of life possible.  You may receive a survey from a nurse that asks questions about your current state of health.  Based on the survey results, all eligible patients will be referred to the [facility] program for an evaluation so we can better serve you!  A frequently asked questions sheet is available upon request.

## 2013-12-10 ENCOUNTER — Encounter: Payer: Self-pay | Admitting: *Deleted

## 2013-12-10 NOTE — Progress Notes (Signed)
[facility] Psychosocial Distress Screening Clinical Social Work  Clinical Social Work was referred by distress screening protocol.  The patient scored a 6 on the Psychosocial Distress Thermometer which indicates moderate distress. Clinical Social Worker phoned pt to assess for distress and other psychosocial needs. Pt reports to have good support from her spouse and her children. She shared they are aware of her treatment and now support the decisions she makes regarding her treatment. Pt reports this was a concern in the past when she wanted to not get treatment. CSW explained role of CSW and how CSW can assist. Pt feels her needs were addressed by the medical team at her last visit. Pt agrees to reach out as needed.   ONCBCN DISTRESS SCREENING 12/04/2013  Distress experienced in past week (1-10) 6  Physician notified of physical symptoms (No Data)    Clinical Social Worker follow up needed: No.  If yes, follow up plan:  Loren Racer, [place] Tuesdays 8:30-1pm Wednesdays 8:30-12pm  [phone]

## 2013-12-12 ENCOUNTER — Other Ambulatory Visit (HOSPITAL_COMMUNITY): Payer: Self-pay | Admitting: Oncology

## 2013-12-12 DIAGNOSIS — C189 Malignant neoplasm of colon, unspecified: Secondary | ICD-10-CM

## 2013-12-12 MED ORDER — REGORAFENIB 40 MG PO TABS: ORAL_TABLET | ORAL | Status: DC

## 2013-12-15 NOTE — Progress Notes (Signed)
[name], [place] [address]  Colon adenocarcinoma - Plan: TSH, CBC with Differential, Comprehensive metabolic panel, CEA, TSH, heparin lock flush 100 unit/mL, sodium chloride 0.9 % injection 10 mL  Chemotherapy induced nausea and vomiting  Neoplastic malignant related fatigue  CURRENT THERAPY: Stivarga 80 mg daily x 21 days with 7 day respite, starting on 11/20/2013.  INTERVAL HISTORY: [name] 73 y.o. 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 She is status post RFA ablation initially of single liver lesion in August 2012 followed by 6 cycles of FOLFOX/bevacizumab. That was completed on 04/03/2011. She then had a rising CEA level with recurrent disease in the liver and probably in the lung with small non-hypermetabolic pulmonary nodules. Therefore, she was started on FOLFIRI + Avastin from 01/16/2012- 04/08/2012 at which time her quality of life was declining and she requested a break from therapy.     Colon adenocarcinoma   11/04/2013 Progression CT CAP and head- Interval progression of disease within the chest, abdomen. Progression of pulmonary metastasis. Increase in size of right hepatic lobe metastasis. Progression of retroperitoneal adenopathy and peritoneal adenopathy  metastasis   11/20/2013 -  Chemotherapy Stivarga 80 mg daily 21 days on and 7 days off   I personally reviewed and went over laboratory results with the patient.  The results are noted within this dictation.  She started cycle 2 of therapy today (12/18/2013). She reports that she had minimal nausea with cycle 1 and her appetite decreased approximately 50%. She notes that on her week off she felt much improved. She denies interference with her quality of life while on therapy for cycle 1. Therefore, she will move on to cycle 2 as planned without any dose changes to Stivarga.  I  provided patient education regarding the role of surgical intervention for liver metastases as she asked the question about surgery. I educated her that she is a candidate for that due to having metastatic disease to liver with multiple lesions. I provider education regarding the role of surgical intervention including transplant. She is appreciative of the information.  Oncologically, otherwise, the patient denies any complaints and are was questioning is negative.   Past Medical History  Diagnosis Date  . Colon cancer     liver ca  . HTN (hypertension)   . Thyroid condition   . Diabetes mellitus   . Shingles   . Acute UTI   . Nausea 02/06/2012  . Port catheter in place 08/15/2012    has Colon adenocarcinoma; Thyroid condition; Diabetes type 2, uncontrolled; HTN (hypertension), benign; and Port catheter in place on her problem list.     has No Known Allergies.  We administered heparin lock flush and sodium chloride.  Past Surgical History  Procedure Laterality Date  . Colectomy      and bil. oopherectomy  . Appendectomy    . Blt    . Resection liver partial / total w/ radiofrequency ablation    . Port-a-cath removal      has new port now  . Portacath placement  09/2010  . Oophorectomy Bilateral 2007  . Cataract surgery Right 01/10/13  . Cataract surgery Left 01/24/13    Denies any headaches, dizziness, double vision, fevers, chills, night sweats, vomiting, diarrhea, constipation, chest pain, heart palpitations, shortness of breath, blood in stool, black tarry stool, urinary pain, urinary burning, urinary frequency, hematuria.   PHYSICAL  EXAMINATION  ECOG PERFORMANCE STATUS: 1 - Symptomatic but completely ambulatory  Filed Vitals:   12/18/13 1054  BP: 150/71  Pulse: 85  Temp: 98.4 F (36.9 C)  Resp: 18    GENERAL:alert, no distress, well nourished, well developed, comfortable, cooperative and smiling SKIN: skin color, texture, turgor are normal, no rashes or  significant lesions HEAD: Normocephalic, No masses, lesions, tenderness or abnormalities EYES: normal, PERRLA, EOMI, Conjunctiva are pink and non-injected EARS: External ears normal OROPHARYNX:lips, buccal mucosa, and tongue normal and mucous membranes are moist  NECK: supple, no adenopathy, thyroid normal size, non-tender, without nodularity, trachea midline LYMPH:  no palpable lymphadenopathy BREAST:not examined LUNGS: clear to auscultation  HEART: regular rate & rhythm ABDOMEN:abdomen soft and normal bowel sounds BACK: Back symmetric, no curvature. EXTREMITIES:less then 2 second capillary refill, no joint deformities, effusion, or inflammation, no clubbing, no cyanosis  NEURO: alert & oriented x 3 with fluent speech, no focal motor/sensory deficits, gait normal   LABORATORY DATA: CBC    Component Value Date/Time   WBC 7.1 12/18/2013 1120   RBC 4.20 12/18/2013 1120   HGB 13.1 12/18/2013 1120   HCT 39.0 12/18/2013 1120   PLT 194 12/18/2013 1120   MCV 92.9 12/18/2013 1120   MCH 31.2 12/18/2013 1120   MCHC 33.6 12/18/2013 1120   RDW 13.3 12/18/2013 1120   LYMPHSABS 1.6 12/18/2013 1120   MONOABS 0.6 12/18/2013 1120   EOSABS 0.2 12/18/2013 1120   BASOSABS 0.0 12/18/2013 1120      Chemistry      Component Value Date/Time   NA 138 12/18/2013 1120   K 4.0 12/18/2013 1120   CL 102 12/18/2013 1120   CO2 25 12/18/2013 1120   BUN 6 12/18/2013 1120   CREATININE 0.57 12/18/2013 1120      Component Value Date/Time   CALCIUM 8.2* 12/18/2013 1120   ALKPHOS 68 12/18/2013 1120   AST 19 12/18/2013 1120   ALT 20 12/18/2013 1120   BILITOT 0.4 12/18/2013 1120         ASSESSMENT:  1. 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 She is status post RFA ablation initially of single liver lesion in August 2012 followed by 6 cycles of FOLFOX/bevacizumab. That was completed on 04/03/2011. She then had a rising CEA level with recurrent  disease in the liver and probably in the lung with small non-hypermetabolic pulmonary nodules. Therefore, she was started on FOLFIRI + Avastin from 01/16/2012- 04/08/2012 at which time her quality of life was declining and she requested a break from therapy. Progression of disease is noted on CT scans on 11/04/2013. She is considering palliative treatment options. 2. Difficulty sleeping.  3. Stivarga-induced fatigue 4. Stivarga-induced nausea, controlled on home antiemetics.  Patient Active Problem List   Diagnosis Date Noted  . Port catheter in place 08/15/2012  . Diabetes type 2, uncontrolled 04/13/2012  . HTN (hypertension), benign 04/13/2012  . Thyroid condition   . Colon adenocarcinoma 08/23/2010    Class: Diagnosis of     PLAN:  1. I personally reviewed and went over laboratory results with the patient.  The results are noted within this dictation. 2. Continue Stivarga 80 mg daily x 21 days with 7 day respite. 3. Labs today: CBC diff, CMET, CEA, TSH 4. Continue Stivarga 80 mg daily 3 weeks on and 1 week off. Take medication in Evening.  Starting cycle 2 today (12/18/2013) 5. Return in 4 weeks for follow-up   THERAPY PLAN:  We will continue with Stivarga 80 mg daily 3 weeks on and 1 week off. We will monitor labs. We will restage her after 3rd or 4th cycle of chemotherapy.   All questions were answered. The patient knows to call the clinic with any problems, questions or concerns. We can certainly see the patient much sooner if necessary.  Patient and plan discussed with Dr. Farrel Gobble and he is in agreement with the aforementioned.   Aiko Belko 12/18/2013

## 2013-12-18 ENCOUNTER — Encounter (HOSPITAL_COMMUNITY): Payer: Medicare Other

## 2013-12-18 ENCOUNTER — Encounter (HOSPITAL_BASED_OUTPATIENT_CLINIC_OR_DEPARTMENT_OTHER): Payer: Medicare Other | Admitting: Oncology

## 2013-12-18 ENCOUNTER — Encounter (HOSPITAL_COMMUNITY): Payer: Self-pay | Admitting: Oncology

## 2013-12-18 VITALS — BP 150/71 | HR 85 | Temp 98.4°F | Resp 18 | Wt 172.0 lb

## 2013-12-18 DIAGNOSIS — C189 Malignant neoplasm of colon, unspecified: Secondary | ICD-10-CM

## 2013-12-18 DIAGNOSIS — R530 Neoplastic (malignant) related fatigue: Secondary | ICD-10-CM

## 2013-12-18 DIAGNOSIS — Z9889 Other specified postprocedural states: Secondary | ICD-10-CM | POA: Diagnosis not present

## 2013-12-18 DIAGNOSIS — R112 Nausea with vomiting, unspecified: Secondary | ICD-10-CM

## 2013-12-18 DIAGNOSIS — C787 Secondary malignant neoplasm of liver and intrahepatic bile duct: Secondary | ICD-10-CM

## 2013-12-18 DIAGNOSIS — T451X5A Adverse effect of antineoplastic and immunosuppressive drugs, initial encounter: Secondary | ICD-10-CM

## 2013-12-18 DIAGNOSIS — C19 Malignant neoplasm of rectosigmoid junction: Secondary | ICD-10-CM

## 2013-12-18 LAB — CBC WITH DIFFERENTIAL/PLATELET
Basophils Absolute: 0 10*3/uL (ref 0.0–0.1)
Basophils Relative: 0 % (ref 0–1)
Eosinophils Absolute: 0.2 10*3/uL (ref 0.0–0.7)
Eosinophils Relative: 3 % (ref 0–5)
HCT: 39 % (ref 36.0–46.0)
Hemoglobin: 13.1 g/dL (ref 12.0–15.0)
LYMPHS ABS: 1.6 10*3/uL (ref 0.7–4.0)
Lymphocytes Relative: 23 % (ref 12–46)
MCH: 31.2 pg (ref 26.0–34.0)
MCHC: 33.6 g/dL (ref 30.0–36.0)
MCV: 92.9 fL (ref 78.0–100.0)
Monocytes Absolute: 0.6 10*3/uL (ref 0.1–1.0)
Monocytes Relative: 9 % (ref 3–12)
NEUTROS ABS: 4.6 10*3/uL (ref 1.7–7.7)
NEUTROS PCT: 65 % (ref 43–77)
Platelets: 194 10*3/uL (ref 150–400)
RBC: 4.2 MIL/uL (ref 3.87–5.11)
RDW: 13.3 % (ref 11.5–15.5)
WBC: 7.1 10*3/uL (ref 4.0–10.5)

## 2013-12-18 LAB — COMPREHENSIVE METABOLIC PANEL
ALK PHOS: 68 U/L (ref 39–117)
ALT: 20 U/L (ref 0–35)
ANION GAP: 11 (ref 5–15)
AST: 19 U/L (ref 0–37)
Albumin: 3.3 g/dL — ABNORMAL LOW (ref 3.5–5.2)
BUN: 6 mg/dL (ref 6–23)
CHLORIDE: 102 meq/L (ref 96–112)
CO2: 25 mEq/L (ref 19–32)
Calcium: 8.2 mg/dL — ABNORMAL LOW (ref 8.4–10.5)
Creatinine, Ser: 0.57 mg/dL (ref 0.50–1.10)
GFR calc non Af Amer: >90 mL/min (ref >90)
GLUCOSE: 164 mg/dL — AB (ref 70–99)
POTASSIUM: 4 meq/L (ref 3.7–5.3)
Sodium: 138 mEq/L (ref 137–147)
Total Bilirubin: 0.4 mg/dL (ref 0.3–1.2)
Total Protein: 6.6 g/dL (ref 6.0–8.3)

## 2013-12-18 LAB — TSH: TSH: 1.03 u[IU]/mL (ref 0.350–4.500)

## 2013-12-18 MED ORDER — HEPARIN SOD (PORK) LOCK FLUSH 100 UNIT/ML IV SOLN
500.0000 [IU] | Freq: Once | INTRAVENOUS | Status: AC
  Administered 2013-12-18: 500 [IU] via INTRAVENOUS

## 2013-12-18 MED ORDER — SODIUM CHLORIDE 0.9 % IJ SOLN
10.0000 mL | INTRAMUSCULAR | Status: DC | PRN
  Administered 2013-12-18: 10 mL via INTRAVENOUS
  Filled 2013-12-18: qty 10

## 2013-12-18 NOTE — Patient Instructions (Signed)
[facility] Discharge Instructions  RECOMMENDATIONS MADE BY THE CONSULTANT AND ANY TEST RESULTS WILL BE SENT TO YOUR REFERRING PHYSICIAN.  Continue Stivarga 80 mg daily x 3 weeks, then a 1 week break. Return in 4 weeks for labs and follow-up  Merry Christmas and Happy New Year.  Please call us with any questions and concerns.   Thank you for choosing [facility] to provide your oncology and hematology care.  To afford each patient quality time with our providers, please arrive at least 15 minutes before your scheduled appointment time.  With your help, our goal is to use those 15 minutes to complete the necessary work-up to ensure our physicians have the information they need to help with your evaluation and healthcare recommendations.    Effective January 1st, 2014, we ask that you re-schedule your appointment with our physicians should you arrive 10 or more minutes late for your appointment.  We strive to give you quality time with our providers, and arriving late affects you and other patients whose appointments are after yours.    Again, thank you for choosing [facility].  Our hope is that these requests will decrease the amount of time that you wait before being seen by our physicians.       _____________________________________________________________  Should you have questions after your visit to [facility], please contact our office at [phone] between the hours of 8:30 a.m. and 5:00 p.m.  Voicemails left after 4:30 p.m. will not be returned until the following business day.  For prescription refill requests, have your pharmacy contact our office with your prescription refill request.

## 2013-12-18 NOTE — Progress Notes (Signed)
Diana Eves presented for Portacath access and flush.  Portacath located left chest wall accessed with  H 20 needle.  Good blood return present. Portacath flushed with 35ml NS and 500U/8ml Heparin and needle removed intact.  Procedure tolerated well and without incident.

## 2013-12-19 ENCOUNTER — Encounter (HOSPITAL_COMMUNITY): Payer: Self-pay

## 2013-12-19 LAB — CEA: CEA: 214.4 ng/mL — ABNORMAL HIGH (ref 0.0–5.0)

## 2013-12-24 ENCOUNTER — Encounter (HOSPITAL_COMMUNITY): Payer: Medicare Other

## 2014-01-12 ENCOUNTER — Encounter (HOSPITAL_COMMUNITY): Payer: Self-pay | Admitting: Oncology

## 2014-01-12 NOTE — Assessment & Plan Note (Addendum)
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  Now on salvage Stivarga 80 mg daily 21 days on and 7 days off.  Starting cycle 3 of therapy today, 01/15/2014.  Labs today: CBC diff, CMET, CEA, TSH. Continue same dose of 80 mg of Stivarga as ordered.  Return in 4 weeks for follow-up and labs: CBC diff, CMET, CEA, TSH.

## 2014-01-12 NOTE — Progress Notes (Signed)
[name], MD [place] [id number]  Colon adenocarcinoma - Plan: heparin lock flush 100 unit/mL, sodium chloride 0.9 % injection 10 mL, Schedule Portacath Flush Appointment, CBC with Differential, Comprehensive metabolic panel, CEA, TSH  Nausea without vomiting - Plan: ondansetron (ZOFRAN) 8 MG tablet  Dry mouth  CURRENT THERAPY: Stivarga 80 mg daily x 21 days with 7 day respite, starting on 11/20/2013. Starting cycle 3 of therapy on 01/15/2014, today.  INTERVAL HISTORY: [name] 74 y.o. 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 She is status post RFA ablation initially of single liver lesion in August 2012 followed by 6 cycles of FOLFOX/bevacizumab. That was completed on 04/03/2011. She then had a rising CEA level with recurrent disease in the liver and probably in the lung with small non-hypermetabolic pulmonary nodules. Therefore, she was started on FOLFIRI + Avastin from 01/16/2012- 04/08/2012 at which time her quality of life was declining and she requested a break from therapy.     Colon adenocarcinoma   11/04/2013 Progression CT CAP and head- Interval progression of disease within the chest, abdomen. Progression of pulmonary metastasis. Increase in size of right hepatic lobe metastasis. Progression of retroperitoneal adenopathy and peritoneal adenopathy  metastasis   11/20/2013 -  Chemotherapy Stivarga 80 mg daily 21 days on and 7 days off    I personally reviewed and went over laboratory results with the patient.  The results are noted within this dictation.  She started cycle 3 of therapy on 01/15/2014, today.  CEA is slowly starting to demonstrate a possible response to therapy.  We will update that today.  She notes some nausea without vomiting.  She found an old prescription from Dr. Tressie Stalker for Zofran.  I suspect this is outdated.  She reports that it is  effective for her.  I will give her a new Rx for Zofran.  Otherwise, she is doing well.   Past Medical History  Diagnosis Date  . Colon cancer     liver ca  . HTN (hypertension)   . Thyroid condition   . Diabetes mellitus   . Shingles   . Acute UTI   . Nausea 02/06/2012  . Port catheter in place 08/15/2012  . Diabetes type 2, controlled 04/13/2012    has Colon adenocarcinoma; Thyroid condition; Diabetes type 2, controlled; HTN (hypertension), benign; Port catheter in place; Dry mouth; and Nausea without vomiting on her problem list.     has No Known Allergies.  We administered heparin lock flush and sodium chloride.  Past Surgical History  Procedure Laterality Date  . Colectomy      and bil. oopherectomy  . Appendectomy    . Blt    . Resection liver partial / total w/ radiofrequency ablation    . Port-a-cath removal      has new port now  . Portacath placement  09/2010  . Oophorectomy Bilateral 2007  . Cataract surgery Right 01/10/13  . Cataract surgery Left 01/24/13    Denies any headaches, dizziness, double vision, fevers, chills, night sweats, vomiting, diarrhea, constipation, chest pain, heart palpitations, shortness of breath, blood in stool, black tarry stool, urinary pain, urinary burning, urinary frequency, hematuria.   PHYSICAL EXAMINATION  ECOG PERFORMANCE STATUS: 1 - Symptomatic but completely ambulatory  Filed Vitals:   01/15/14 1100  BP: 132/82  Pulse: 80  Temp: 98 F (36.7 C)  Resp: 16  GENERAL:alert, no distress, well nourished, well developed, comfortable, cooperative and smiling SKIN: skin color, texture, turgor are normal, no rashes or significant lesions HEAD: Normocephalic, No masses, lesions, tenderness or abnormalities EYES: normal, PERRLA, EOMI, Conjunctiva are pink and non-injected EARS: External ears normal OROPHARYNX:mucous membranes are moist  NECK: supple, trachea midline LYMPH:  not examined BREAST:not examined LUNGS: not  examined HEART: not examined ABDOMEN:not examined BACK: Back symmetric, no curvature. EXTREMITIES:less then 2 second capillary refill, no skin discoloration, no cyanosis  NEURO: alert & oriented x 3 with fluent speech, no focal motor/sensory deficits, gait normal   LABORATORY DATA: CBC    Component Value Date/Time   WBC 8.3 01/15/2014 1215   RBC 4.18 01/15/2014 1215   HGB 13.3 01/15/2014 1215   HCT 39.4 01/15/2014 1215   PLT 242 01/15/2014 1215   MCV 94.3 01/15/2014 1215   MCH 31.8 01/15/2014 1215   MCHC 33.8 01/15/2014 1215   RDW 14.4 01/15/2014 1215   LYMPHSABS 1.6 01/15/2014 1215   MONOABS 0.8 01/15/2014 1215   EOSABS 0.2 01/15/2014 1215   BASOSABS 0.1 01/15/2014 1215      Chemistry      Component Value Date/Time   NA 138 12/18/2013 1120   K 4.0 12/18/2013 1120   CL 102 12/18/2013 1120   CO2 25 12/18/2013 1120   BUN 6 12/18/2013 1120   CREATININE 0.57 12/18/2013 1120      Component Value Date/Time   CALCIUM 8.2* 12/18/2013 1120   ALKPHOS 68 12/18/2013 1120   AST 19 12/18/2013 1120   ALT 20 12/18/2013 1120   BILITOT 0.4 12/18/2013 1120     Lab Results  Component Value Date   CEA 214.4* 12/18/2013      ASSESSMENT AND PLAN:  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  Now on salvage Stivarga 80 mg daily 21 days on and 7 days off.  Starting cycle 3 of therapy today, 01/15/2014.  Labs today: CBC diff, CMET, CEA, TSH. Continue same dose of 80 mg of Stivarga as ordered.  Return in 4 weeks for follow-up and labs: CBC diff, CMET, CEA, TSH.    Dry mouth Recommend Biotene gum.  Recommend changing medication to bedtime dosing if desired.   Nausea without vomiting Rx for Zofran escribed.    THERAPY PLAN:  We will continue with Stivarga 80 mg daily 3 weeks on and 1 week off. We will monitor labs. We will restage her after 4th cycle of chemotherapy with CT CAP w contrast.    All questions were answered. The patient knows to call the clinic with any problems, questions or concerns. We can certainly see the patient much sooner if necessary.  Patient and plan discussed with Dr. Ancil Linsey and she is in agreement with the aforementioned.   KEFALAS,THOMAS 01/15/2014

## 2014-01-15 ENCOUNTER — Encounter (HOSPITAL_COMMUNITY): Payer: Medicare Other | Attending: Oncology | Admitting: Oncology

## 2014-01-15 ENCOUNTER — Encounter (HOSPITAL_COMMUNITY): Payer: Medicare Other

## 2014-01-15 ENCOUNTER — Encounter (HOSPITAL_COMMUNITY): Payer: Self-pay | Admitting: Oncology

## 2014-01-15 VITALS — BP 132/82 | HR 80 | Temp 98.0°F | Resp 16 | Wt 169.8 lb

## 2014-01-15 DIAGNOSIS — Z452 Encounter for adjustment and management of vascular access device: Secondary | ICD-10-CM | POA: Diagnosis not present

## 2014-01-15 DIAGNOSIS — C19 Malignant neoplasm of rectosigmoid junction: Secondary | ICD-10-CM

## 2014-01-15 DIAGNOSIS — I10 Essential (primary) hypertension: Secondary | ICD-10-CM | POA: Diagnosis not present

## 2014-01-15 DIAGNOSIS — C7800 Secondary malignant neoplasm of unspecified lung: Secondary | ICD-10-CM | POA: Insufficient documentation

## 2014-01-15 DIAGNOSIS — C787 Secondary malignant neoplasm of liver and intrahepatic bile duct: Secondary | ICD-10-CM | POA: Diagnosis not present

## 2014-01-15 DIAGNOSIS — R682 Dry mouth, unspecified: Secondary | ICD-10-CM | POA: Diagnosis not present

## 2014-01-15 DIAGNOSIS — R110 Nausea: Secondary | ICD-10-CM | POA: Insufficient documentation

## 2014-01-15 DIAGNOSIS — E079 Disorder of thyroid, unspecified: Secondary | ICD-10-CM | POA: Insufficient documentation

## 2014-01-15 DIAGNOSIS — E118 Type 2 diabetes mellitus with unspecified complications: Secondary | ICD-10-CM | POA: Insufficient documentation

## 2014-01-15 DIAGNOSIS — C189 Malignant neoplasm of colon, unspecified: Secondary | ICD-10-CM

## 2014-01-15 LAB — COMPREHENSIVE METABOLIC PANEL
ALT: 27 U/L (ref 0–35)
ANION GAP: 8 (ref 5–15)
AST: 23 U/L (ref 0–37)
Albumin: 3.6 g/dL (ref 3.5–5.2)
Alkaline Phosphatase: 54 U/L (ref 39–117)
BILIRUBIN TOTAL: 0.9 mg/dL (ref 0.3–1.2)
BUN: 10 mg/dL (ref 6–23)
CALCIUM: 8.7 mg/dL (ref 8.4–10.5)
CO2: 26 mmol/L (ref 19–32)
Chloride: 106 mEq/L (ref 96–112)
Creatinine, Ser: 0.56 mg/dL (ref 0.50–1.10)
GFR calc Af Amer: >90 mL/min (ref >90)
GLUCOSE: 164 mg/dL — AB (ref 70–99)
POTASSIUM: 3.5 mmol/L (ref 3.5–5.1)
Sodium: 140 mmol/L (ref 135–145)
Total Protein: 6.6 g/dL (ref 6.0–8.3)

## 2014-01-15 LAB — CBC WITH DIFFERENTIAL/PLATELET
BASOS ABS: 0.1 10*3/uL (ref 0.0–0.1)
Basophils Relative: 1 % (ref 0–1)
EOS PCT: 3 % (ref 0–5)
Eosinophils Absolute: 0.2 10*3/uL (ref 0.0–0.7)
HCT: 39.4 % (ref 36.0–46.0)
Hemoglobin: 13.3 g/dL (ref 12.0–15.0)
Lymphocytes Relative: 19 % (ref 12–46)
Lymphs Abs: 1.6 10*3/uL (ref 0.7–4.0)
MCH: 31.8 pg (ref 26.0–34.0)
MCHC: 33.8 g/dL (ref 30.0–36.0)
MCV: 94.3 fL (ref 78.0–100.0)
MONO ABS: 0.8 10*3/uL (ref 0.1–1.0)
Monocytes Relative: 9 % (ref 3–12)
Neutro Abs: 5.7 10*3/uL (ref 1.7–7.7)
Neutrophils Relative %: 68 % (ref 43–77)
Platelets: 242 10*3/uL (ref 150–400)
RBC: 4.18 MIL/uL (ref 3.87–5.11)
RDW: 14.4 % (ref 11.5–15.5)
WBC: 8.3 10*3/uL (ref 4.0–10.5)

## 2014-01-15 LAB — TSH: TSH: 0.805 u[IU]/mL (ref 0.350–4.500)

## 2014-01-15 MED ORDER — HEPARIN SOD (PORK) LOCK FLUSH 100 UNIT/ML IV SOLN
500.0000 [IU] | Freq: Once | INTRAVENOUS | Status: AC
  Administered 2014-01-15: 500 [IU] via INTRAVENOUS
  Filled 2014-01-15: qty 5

## 2014-01-15 MED ORDER — SODIUM CHLORIDE 0.9 % IJ SOLN
10.0000 mL | INTRAMUSCULAR | Status: DC | PRN
Start: 2014-01-15 — End: 2014-01-15
  Administered 2014-01-15: 10 mL via INTRAVENOUS
  Filled 2014-01-15: qty 10

## 2014-01-15 MED ORDER — ONDANSETRON HCL 8 MG PO TABS: 8.0000 mg | ORAL_TABLET | Freq: Three times a day (TID) | ORAL | Status: DC | PRN

## 2014-01-15 MED ORDER — REGORAFENIB 40 MG PO TABS: ORAL_TABLET | ORAL | Status: DC

## 2014-01-15 NOTE — Progress Notes (Signed)
Please see doctors encounter for more information 

## 2014-01-15 NOTE — Patient Instructions (Signed)
[place] at [facility]  Discharge Instructions:  Continue Stivarga as prescribed. I refilled your Zofran and sent it to your pharmacy. Return in 4 weeks for follow-up. _______________________________________________________________  Thank you for choosing [place] at [facility] to provide your oncology and hematology care.  To afford each patient quality time with our providers, please arrive at least 15 minutes before your scheduled appointment.  You need to re-schedule your appointment if you arrive 10 or more minutes late.  We strive to give you quality time with our providers, and arriving late affects you and other patients whose appointments are after yours.  Also, if you no show three or more times for appointments you may be dismissed from the clinic.  Again, thank you for choosing [place] at [facility] hope is that these requests will allow you access to exceptional care and in a timely manner. _______________________________________________________________  If you have questions after your visit, please contact our office at [phone] between the hours of 8:30 a.m. and 5:00 p.m. Voicemails left after 4:30 p.m. will not be returned until the following business day. _______________________________________________________________  For prescription refill requests, have your pharmacy contact our office. _______________________________________________________________  Recommendations made by the consultant and any test results will be sent to your referring physician. _______________________________________________________________

## 2014-01-15 NOTE — Assessment & Plan Note (Signed)
Recommend Biotene gum.  Recommend changing medication to bedtime dosing if desired.

## 2014-01-15 NOTE — Progress Notes (Signed)
[name] presented for [employer]. Labs per MD order drawn via Portacath located in the left chest wall accessed with  H 20 needle. Good blood return present. Procedure without incident.  Portacath flushed with 69ml NS and 500U/64ml Heparin per protocol and needle removed intact. Patient tolerated procedure well.

## 2014-01-15 NOTE — Addendum Note (Signed)
Addended by: Baird Cancer on: 01/15/2014 06:19 PM   Modules accepted: Orders

## 2014-01-15 NOTE — Assessment & Plan Note (Signed)
Rx for Zofran escribed.

## 2014-01-16 ENCOUNTER — Telehealth: Payer: Self-pay | Admitting: Nutrition

## 2014-01-16 ENCOUNTER — Encounter (HOSPITAL_COMMUNITY): Payer: Self-pay

## 2014-01-16 LAB — CEA: CEA: 107.6 ng/mL — ABNORMAL HIGH (ref 0.0–4.7)

## 2014-01-16 NOTE — Telephone Encounter (Signed)
Patient identified to be at risk for malnutrition on the MST secondary to weight loss and poor appetite. Contacted patient by phone who reports her appetite is slowly improving and is at 75%. Patient reports occasional nausea but no vomiting. Patient unable to afford prescription for Zofran, so she did not pick it up. Patient reports drinking occasional boost oral nutrition supplement.  Educated patient to consume small frequent meals and snacks utilizing high-calorie high-protein foods. Educated patient on strategies for eating if she has nausea. Recommended patient contact provider for a different nausea medication option. Will mail fact sheets on increasing calories and protein, and nausea vomiting to patient.   Also milk coupons for oral nutrition supplements. Questions were answered.  Teach back method used. Contact information was given.  **Disclaimer: This note was dictated with voice recognition software. Similar sounding words can inadvertently be transcribed and this note may contain transcription errors which may not have been corrected upon publication of note.**

## 2014-02-09 DIAGNOSIS — J029 Acute pharyngitis, unspecified: Secondary | ICD-10-CM

## 2014-02-09 HISTORY — DX: Acute pharyngitis, unspecified: J02.9

## 2014-02-12 ENCOUNTER — Encounter (HOSPITAL_COMMUNITY): Payer: Self-pay | Admitting: Oncology

## 2014-02-12 ENCOUNTER — Encounter (HOSPITAL_COMMUNITY): Payer: Medicare Other | Attending: Oncology | Admitting: Oncology

## 2014-02-12 VITALS — BP 136/87 | HR 102 | Temp 98.4°F | Resp 14 | Wt 162.4 lb

## 2014-02-12 DIAGNOSIS — C19 Malignant neoplasm of rectosigmoid junction: Secondary | ICD-10-CM

## 2014-02-12 DIAGNOSIS — M6248 Contracture of muscle, other site: Secondary | ICD-10-CM | POA: Insufficient documentation

## 2014-02-12 DIAGNOSIS — C787 Secondary malignant neoplasm of liver and intrahepatic bile duct: Secondary | ICD-10-CM

## 2014-02-12 DIAGNOSIS — F329 Major depressive disorder, single episode, unspecified: Secondary | ICD-10-CM

## 2014-02-12 DIAGNOSIS — C189 Malignant neoplasm of colon, unspecified: Secondary | ICD-10-CM

## 2014-02-12 DIAGNOSIS — F4321 Adjustment disorder with depressed mood: Secondary | ICD-10-CM

## 2014-02-12 DIAGNOSIS — C7800 Secondary malignant neoplasm of unspecified lung: Secondary | ICD-10-CM

## 2014-02-12 DIAGNOSIS — S29012A Strain of muscle and tendon of back wall of thorax, initial encounter: Secondary | ICD-10-CM

## 2014-02-12 DIAGNOSIS — C772 Secondary and unspecified malignant neoplasm of intra-abdominal lymph nodes: Secondary | ICD-10-CM

## 2014-02-12 DIAGNOSIS — M62838 Other muscle spasm: Secondary | ICD-10-CM

## 2014-02-12 DIAGNOSIS — S46819A Strain of other muscles, fascia and tendons at shoulder and upper arm level, unspecified arm, initial encounter: Secondary | ICD-10-CM | POA: Insufficient documentation

## 2014-02-12 DIAGNOSIS — S46811A Strain of other muscles, fascia and tendons at shoulder and upper arm level, right arm, initial encounter: Secondary | ICD-10-CM

## 2014-02-12 LAB — COMPREHENSIVE METABOLIC PANEL
ALK PHOS: 49 U/L (ref 39–117)
ALT: 25 U/L (ref 0–35)
AST: 23 U/L (ref 0–37)
Albumin: 3.6 g/dL (ref 3.5–5.2)
Anion gap: 9 (ref 5–15)
BILIRUBIN TOTAL: 0.9 mg/dL (ref 0.3–1.2)
BUN: 8 mg/dL (ref 6–23)
CHLORIDE: 105 mmol/L (ref 96–112)
CO2: 24 mmol/L (ref 19–32)
Calcium: 8.6 mg/dL (ref 8.4–10.5)
Creatinine, Ser: 0.73 mg/dL (ref 0.50–1.10)
GFR calc non Af Amer: 83 mL/min — ABNORMAL LOW (ref >90)
GLUCOSE: 169 mg/dL — AB (ref 70–99)
POTASSIUM: 3.7 mmol/L (ref 3.5–5.1)
Sodium: 138 mmol/L (ref 135–145)
Total Protein: 7.3 g/dL (ref 6.0–8.3)

## 2014-02-12 LAB — CBC WITH DIFFERENTIAL/PLATELET
Basophils Absolute: 0 10*3/uL (ref 0.0–0.1)
Basophils Relative: 0 % (ref 0–1)
EOS ABS: 0.1 10*3/uL (ref 0.0–0.7)
Eosinophils Relative: 1 % (ref 0–5)
HEMATOCRIT: 42.3 % (ref 36.0–46.0)
HEMOGLOBIN: 13.9 g/dL (ref 12.0–15.0)
LYMPHS ABS: 1.7 10*3/uL (ref 0.7–4.0)
Lymphocytes Relative: 22 % (ref 12–46)
MCH: 31.3 pg (ref 26.0–34.0)
MCHC: 32.9 g/dL (ref 30.0–36.0)
MCV: 95.3 fL (ref 78.0–100.0)
Monocytes Absolute: 0.7 10*3/uL (ref 0.1–1.0)
Monocytes Relative: 9 % (ref 3–12)
NEUTROS ABS: 5.2 10*3/uL (ref 1.7–7.7)
NEUTROS PCT: 68 % (ref 43–77)
Platelets: 293 10*3/uL (ref 150–400)
RBC: 4.44 MIL/uL (ref 3.87–5.11)
RDW: 14.4 % (ref 11.5–15.5)
WBC: 7.8 10*3/uL (ref 4.0–10.5)

## 2014-02-12 LAB — TSH: TSH: 0.967 u[IU]/mL (ref 0.350–4.500)

## 2014-02-12 MED ORDER — HEPARIN SOD (PORK) LOCK FLUSH 100 UNIT/ML IV SOLN
500.0000 [IU] | Freq: Once | INTRAVENOUS | Status: AC
  Administered 2014-02-12: 500 [IU] via INTRAVENOUS
  Filled 2014-02-12: qty 5

## 2014-02-12 MED ORDER — CYCLOBENZAPRINE HCL 10 MG PO TABS: 10.0000 mg | ORAL_TABLET | Freq: Three times a day (TID) | ORAL | Status: DC | PRN

## 2014-02-12 MED ORDER — SODIUM CHLORIDE 0.9 % IJ SOLN
10.0000 mL | INTRAMUSCULAR | Status: DC | PRN
  Administered 2014-02-12: 10 mL via INTRAVENOUS
  Filled 2014-02-12: qty 10

## 2014-02-12 NOTE — Assessment & Plan Note (Signed)
Rx for Flexeril

## 2014-02-12 NOTE — Assessment & Plan Note (Signed)
Secondary to the sudden loss of her son at the end of Jan 2016.

## 2014-02-12 NOTE — Progress Notes (Signed)
Anita Mustache, MD [place] [address]  Colon adenocarcinoma - Plan: CBC with Differential, Comprehensive metabolic panel, CEA, TSH, CBC with Differential, Comprehensive metabolic panel, CEA, TSH, CT Abdomen Pelvis W Contrast, CT Chest W Contrast, CBC with Differential, Comprehensive metabolic panel, CEA, TSH, Schedule Portacath Flush Appointment, heparin lock flush 100 unit/mL, sodium chloride 0.9 % injection 10 mL  Muscle spasms of neck - Plan: cyclobenzaprine (FLEXERIL) 10 MG tablet  Trapezius muscle strain, right, initial encounter  Reactive depression (situational)  CURRENT THERAPY: Stivarga 80 mg daily x 21 days with 7 day respite, starting on 11/20/2013. Starting cycle 4 of therapy on 02/12/2014, today.  INTERVAL HISTORY: [name] 74 y.o. 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 She is status post RFA ablation initially of single liver lesion in August 2012 followed by 6 cycles of FOLFOX/bevacizumab. That was completed on 04/03/2011. She then had a rising CEA level with recurrent disease in the liver and probably in the lung with small non-hypermetabolic pulmonary nodules. Therefore, she was started on FOLFIRI + Avastin from 01/16/2012- 04/08/2012 at which time her quality of life was declining and she requested a break from therapy.     Colon adenocarcinoma   11/04/2013 Progression CT CAP and head- Interval progression of disease within the chest, abdomen. Progression of pulmonary metastasis. Increase in size of right hepatic lobe metastasis. Progression of retroperitoneal adenopathy and peritoneal adenopathy  metastasis   11/20/2013 -  Chemotherapy Stivarga 80 mg daily 21 days on and 7 days off   I personally reviewed and went over laboratory results with the patient.  The results are noted within this dictation.  Unfortunately, [name]'s son suddenly passed away at the  end of February 01, 2014.  She is of course having a difficult time with that.  She looks run down today and even though she is smiling, she looks down.  She is dealing and coping with this sudden loss.  It is causing increased fatigue.  Otherwise, she is tolerating therapy well.  She notes fatigue and it is hard to decipher whether this is chemotherapy induced versus reactive to personal stress and reactive depression.  I will not change anything with her chemotherapy at this time given that she is on low dose and she reports that the fatigue does not interfere with her QOL.    She notes a right trapezius muscle pain that is worse with movement and palpation.  She notes that flexeril has been helpful in the past for a similar situation.  Oncologically, she otherwise is good with a negative ROS questioning.  Past Medical History  Diagnosis Date  . Colon cancer     liver ca  . HTN (hypertension)   . Thyroid condition   . Diabetes mellitus   . Shingles   . Acute UTI   . Nausea 02/06/2012  . Port catheter in place 08/15/2012  . Diabetes type 2, controlled 04/13/2012  . Sore throat 02/09/14    has Colon adenocarcinoma; Thyroid condition; Diabetes type 2, controlled; HTN (hypertension), benign; Port catheter in place; Dry mouth; Nausea without vomiting; Trapezius muscle strain; and Reactive depression (situational) on her problem list.     has No Known Allergies.  We administered heparin lock flush and sodium chloride.  Past Surgical History  Procedure Laterality Date  . Colectomy      and bil. oopherectomy  . Appendectomy    .  Blt    . Resection liver partial / total w/ radiofrequency ablation    . Port-a-cath removal      has new port now  . Portacath placement  09/2010  . Oophorectomy Bilateral 2007  . Cataract surgery Right 01/10/13  . Cataract surgery Left 01/24/13    Denies any headaches, dizziness, double vision, fevers, chills, night sweats, nausea, vomiting, diarrhea, constipation, chest  pain, heart palpitations, shortness of breath, blood in stool, black tarry stool, urinary pain, urinary burning, urinary frequency, hematuria.   PHYSICAL EXAMINATION  ECOG PERFORMANCE STATUS: 1 - Symptomatic but completely ambulatory  Filed Vitals:   02/12/14 1130  BP: 136/87  Pulse: 102  Temp: 98.4 F (36.9 C)  Resp: 14    GENERAL:alert, no distress, well nourished, well developed, comfortable, cooperative and smiling SKIN: skin color, texture, turgor are normal, no rashes or significant lesions HEAD: Normocephalic, No masses, lesions, tenderness or abnormalities EYES: normal, PERRLA, EOMI, Conjunctiva are pink and non-injected EARS: External ears normal OROPHARYNX:lips, buccal mucosa, and tongue normal and mucous membranes are moist  NECK: supple, no adenopathy, thyroid normal size, non-tender, without nodularity, no stridor, non-tender, trachea midline LYMPH:  no palpable lymphadenopathy BREAST:not examined LUNGS: clear to auscultation and percussion HEART: regular rate & rhythm, no murmurs, no gallops, S1 normal and S2 normal ABDOMEN:abdomen soft, non-tender and normal bowel sounds BACK: Back symmetric, no curvature., No CVA tenderness EXTREMITIES:less then 2 second capillary refill, no joint deformities, effusion, or inflammation, no edema, no skin discoloration, no clubbing, no cyanosis  NEURO: alert & oriented x 3 with fluent speech, no focal motor/sensory deficits, gait normal   LABORATORY DATA: CBC    Component Value Date/Time   WBC 7.8 02/12/2014 1217   RBC 4.44 02/12/2014 1217   HGB 13.9 02/12/2014 1217   HCT 42.3 02/12/2014 1217   PLT 293 02/12/2014 1217   MCV 95.3 02/12/2014 1217   MCH 31.3 02/12/2014 1217   MCHC 32.9 02/12/2014 1217   RDW 14.4 02/12/2014 1217   LYMPHSABS 1.7 02/12/2014 1217   MONOABS 0.7 02/12/2014 1217   EOSABS 0.1 02/12/2014 1217   BASOSABS 0.0 02/12/2014 1217      Chemistry      Component Value Date/Time   NA 138 02/12/2014 1217     K 3.7 02/12/2014 1217   CL 105 02/12/2014 1217   CO2 24 02/12/2014 1217   BUN 8 02/12/2014 1217   CREATININE 0.73 02/12/2014 1217      Component Value Date/Time   CALCIUM 8.6 02/12/2014 1217   ALKPHOS 49 02/12/2014 1217   AST 23 02/12/2014 1217   ALT 25 02/12/2014 1217   BILITOT 0.9 02/12/2014 1217      Lab Results  Component Value Date   CEA 107.6* 01/15/2014      ASSESSMENT AND PLAN:  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  Now on salvage Stivarga 80 mg daily 21 days on and 7 days off.  Starting cycle 4 of therapy today, 02/12/2014.  Labs today: CBC diff, CMET, CEA, TSH. Continue same dose of 80 mg of Stivarga as ordered.  Restaging scans in 3-4 weeks with CT CAP with contrast.  Return on Return in 4 weeks for follow-up on March 8- 9th which is one-two days prior to the start of cycle 5 of therapy which is planned to start on March 10.  Labs on March 7 or 8th for imaging scans and pre-chemo: CBC diff, CMET,  CEA, TSH.    Trapezius muscle strain Rx for Flexeril   Reactive depression (situational) Secondary to the sudden loss of her son at the end of Jan 2016.     THERAPY PLAN:  We will continue with Stivarga 80 mg daily 3 weeks on and 1 week off. We will monitor labs. We will restage her in 3-4 weeks which will be at the completion of cycle 4 with CT CAP w contrast.   All questions were answered. The patient knows to call the clinic with any problems, questions or concerns. We can certainly see the patient much sooner if necessary.  Patient and plan discussed with Dr. Ancil Linsey and she is in agreement with the aforementioned.   KEFALAS,THOMAS 02/12/2014

## 2014-02-12 NOTE — Assessment & Plan Note (Signed)
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  Now on salvage Stivarga 80 mg daily 21 days on and 7 days off.  Starting cycle 4 of therapy today, 02/12/2014.  Labs today: CBC diff, CMET, CEA, TSH. Continue same dose of 80 mg of Stivarga as ordered.  Restaging scans in 3-4 weeks with CT CAP with contrast.  Return on Return in 4 weeks for follow-up on March 8- 9th which is one-two days prior to the start of cycle 5 of therapy which is planned to start on March 10.  Labs on March 7 or 8th for imaging scans and pre-chemo: CBC diff, CMET, CEA, TSH.

## 2014-02-12 NOTE — Progress Notes (Signed)
Diana Eves presented for [employer]. Labs per MD order drawn via Portacath located in the left chest wall accessed with  H 20 needle. Good blood return present. Procedure without incident.  Portacath flushed with 38ml NS and 500U/43ml Heparin per protocol and needle removed intact. Patient tolerated procedure well.

## 2014-02-12 NOTE — Patient Instructions (Signed)
[place] at [facility] Discharge Instructions  RECOMMENDATIONS MADE BY THE CONSULTANT AND ANY TEST RESULTS WILL BE SENT TO YOUR REFERRING PHYSICIAN.  Exam and discussion by Robynn Pane, PA-C. Flexeril - take as directed. Will check scans in March to see how you are doing. Report any concerns and issues.  Follow-up as scheduled after scans.  Thank you for choosing [place] at [facility] to provide your oncology and hematology care.  To afford each patient quality time with our provider, please arrive at least 15 minutes before your scheduled appointment time.    You need to re-schedule your appointment should you arrive 10 or more minutes late.  We strive to give you quality time with our providers, and arriving late affects you and other patients whose appointments are after yours.  Also, if you no show three or more times for appointments you may be dismissed from the clinic at the providers discretion.     Again, thank you for choosing [facility].  Our hope is that these requests will decrease the amount of time that you wait before being seen by our physicians.       _____________________________________________________________  Should you have questions after your visit to [facility], please contact our office at [phone] between the hours of 8:30 a.m. and 4:30 p.m.  Voicemails left after 4:30 p.m. will not be returned until the following business day.  For prescription refill requests, have your pharmacy contact our office.

## 2014-02-13 LAB — CEA: CEA: 80.1 ng/mL — ABNORMAL HIGH (ref 0.0–4.7)

## 2014-02-25 ENCOUNTER — Other Ambulatory Visit (HOSPITAL_COMMUNITY): Payer: Self-pay | Admitting: Oncology

## 2014-02-25 DIAGNOSIS — C189 Malignant neoplasm of colon, unspecified: Secondary | ICD-10-CM

## 2014-02-25 MED ORDER — REGORAFENIB 40 MG PO TABS: ORAL_TABLET | ORAL | Status: DC

## 2014-02-26 ENCOUNTER — Encounter (HOSPITAL_COMMUNITY): Payer: BLUE CROSS/BLUE SHIELD

## 2014-03-09 ENCOUNTER — Ambulatory Visit (HOSPITAL_COMMUNITY)
Admission: RE | Admit: 2014-03-09 | Discharge: 2014-03-09 | Disposition: A | Discharge status: Home / Self Care | Payer: Medicare Other | Source: Ambulatory Visit | Attending: Oncology | Admitting: Oncology

## 2014-03-09 ENCOUNTER — Encounter (HOSPITAL_COMMUNITY): Payer: Self-pay

## 2014-03-09 ENCOUNTER — Encounter (HOSPITAL_COMMUNITY): Payer: Medicare Other | Attending: Internal Medicine

## 2014-03-09 DIAGNOSIS — C19 Malignant neoplasm of rectosigmoid junction: Secondary | ICD-10-CM | POA: Diagnosis not present

## 2014-03-09 DIAGNOSIS — C189 Malignant neoplasm of colon, unspecified: Secondary | ICD-10-CM | POA: Insufficient documentation

## 2014-03-09 DIAGNOSIS — Z9889 Other specified postprocedural states: Secondary | ICD-10-CM | POA: Insufficient documentation

## 2014-03-09 LAB — CBC WITH DIFFERENTIAL/PLATELET
BASOS PCT: 1 % (ref 0–1)
Basophils Absolute: 0 10*3/uL (ref 0.0–0.1)
EOS PCT: 1 % (ref 0–5)
Eosinophils Absolute: 0.1 10*3/uL (ref 0.0–0.7)
HEMATOCRIT: 41 % (ref 36.0–46.0)
HEMOGLOBIN: 13.8 g/dL (ref 12.0–15.0)
Lymphocytes Relative: 19 % (ref 12–46)
Lymphs Abs: 1.7 10*3/uL (ref 0.7–4.0)
MCH: 32.5 pg (ref 26.0–34.0)
MCHC: 33.7 g/dL (ref 30.0–36.0)
MCV: 96.7 fL (ref 78.0–100.0)
MONO ABS: 0.8 10*3/uL (ref 0.1–1.0)
MONOS PCT: 9 % (ref 3–12)
NEUTROS ABS: 6.2 10*3/uL (ref 1.7–7.7)
Neutrophils Relative %: 70 % (ref 43–77)
Platelets: 242 10*3/uL (ref 150–400)
RBC: 4.24 MIL/uL (ref 3.87–5.11)
RDW: 14.1 % (ref 11.5–15.5)
WBC: 8.8 10*3/uL (ref 4.0–10.5)

## 2014-03-09 LAB — COMPREHENSIVE METABOLIC PANEL
ALT: 36 U/L — AB (ref 0–35)
AST: 25 U/L (ref 0–37)
Albumin: 3.9 g/dL (ref 3.5–5.2)
Alkaline Phosphatase: 47 U/L (ref 39–117)
Anion gap: 10 (ref 5–15)
BUN: 7 mg/dL (ref 6–23)
CALCIUM: 8.9 mg/dL (ref 8.4–10.5)
CO2: 25 mmol/L (ref 19–32)
Chloride: 102 mmol/L (ref 96–112)
Creatinine, Ser: 0.59 mg/dL (ref 0.50–1.10)
GFR, EST NON AFRICAN AMERICAN: 89 mL/min — AB (ref >90)
GLUCOSE: 141 mg/dL — AB (ref 70–99)
Potassium: 3.9 mmol/L (ref 3.5–5.1)
Sodium: 137 mmol/L (ref 135–145)
Total Bilirubin: 0.6 mg/dL (ref 0.3–1.2)
Total Protein: 7.5 g/dL (ref 6.0–8.3)

## 2014-03-09 LAB — TSH: TSH: 0.667 u[IU]/mL (ref 0.350–4.500)

## 2014-03-09 MED ORDER — HEPARIN SOD (PORK) LOCK FLUSH 100 UNIT/ML IV SOLN
INTRAVENOUS | Status: AC
  Filled 2014-03-09: qty 5

## 2014-03-09 MED ORDER — IOHEXOL 300 MG/ML  SOLN
100.0000 mL | Freq: Once | INTRAMUSCULAR | Status: AC | PRN
  Administered 2014-03-09: 100 mL via INTRAVENOUS

## 2014-03-09 NOTE — Patient Instructions (Signed)
[place] at [facility] Discharge Instructions  RECOMMENDATIONS MADE BY THE CONSULTANT AND ANY TEST RESULTS WILL BE SENT TO YOUR REFERRING PHYSICIAN.  Did Port flush with labs today. We will see you back tomorrow to see Doctor.  Thank you for choosing [place] at [facility] to provide your oncology and hematology care.  To afford each patient quality time with our provider, please arrive at least 15 minutes before your scheduled appointment time.    You need to re-schedule your appointment should you arrive 10 or more minutes late.  We strive to give you quality time with our providers, and arriving late affects you and other patients whose appointments are after yours.  Also, if you no show three or more times for appointments you may be dismissed from the clinic at the providers discretion.     Again, thank you for choosing [facility].  Our hope is that these requests will decrease the amount of time that you wait before being seen by our physicians.       _____________________________________________________________  Should you have questions after your visit to [facility], please contact our office at [phone] between the hours of 8:30 a.m. and 4:30 p.m.  Voicemails left after 4:30 p.m. will not be returned until the following business day.  For prescription refill requests, have your pharmacy contact our office.

## 2014-03-09 NOTE — Progress Notes (Signed)
[name] presented for Portacath access and flush. Portacath located leftchest wall accessed with  H 20 needle. Good blood return present. Portacath flushed with 30ml NS and 500U/51ml Heparin and needle removed intact. Procedure without incident. Patient tolerated procedure well.

## 2014-03-10 ENCOUNTER — Encounter (HOSPITAL_COMMUNITY): Payer: Self-pay | Admitting: Hematology & Oncology

## 2014-03-10 ENCOUNTER — Encounter (HOSPITAL_BASED_OUTPATIENT_CLINIC_OR_DEPARTMENT_OTHER): Payer: Medicare Other | Admitting: Hematology & Oncology

## 2014-03-10 ENCOUNTER — Other Ambulatory Visit (HOSPITAL_COMMUNITY): Payer: BLUE CROSS/BLUE SHIELD

## 2014-03-10 VITALS — BP 141/69 | HR 95 | Temp 98.0°F | Resp 18 | Wt 161.2 lb

## 2014-03-10 DIAGNOSIS — C189 Malignant neoplasm of colon, unspecified: Secondary | ICD-10-CM

## 2014-03-10 DIAGNOSIS — R970 Elevated carcinoembryonic antigen [CEA]: Secondary | ICD-10-CM

## 2014-03-10 DIAGNOSIS — C787 Secondary malignant neoplasm of liver and intrahepatic bile duct: Secondary | ICD-10-CM

## 2014-03-10 DIAGNOSIS — R911 Solitary pulmonary nodule: Secondary | ICD-10-CM

## 2014-03-10 DIAGNOSIS — C19 Malignant neoplasm of rectosigmoid junction: Secondary | ICD-10-CM

## 2014-03-10 LAB — CEA: CEA: 93 ng/mL — AB (ref 0.0–4.7)

## 2014-03-10 NOTE — Patient Instructions (Signed)
[place] at [facility] Discharge Instructions  RECOMMENDATIONS MADE BY THE CONSULTANT AND ANY TEST RESULTS WILL BE SENT TO YOUR REFERRING PHYSICIAN.  Exam and discussion by Dr. Whitney Muse. Not sure why your CEA is elevated since your scans were good.  We will recheck everything in 2 months and re-evaluate.  Call with any issues or concerns.   Will continue with current therapy for now. Port flush every 6 weeks. CT Scans and labs in 8 weeks. Office visit after scans and labs.   Thank you for choosing [place] at [facility] to provide your oncology and hematology care.  To afford each patient quality time with our provider, please arrive at least 15 minutes before your scheduled appointment time.    You need to re-schedule your appointment should you arrive 10 or more minutes late.  We strive to give you quality time with our providers, and arriving late affects you and other patients whose appointments are after yours.  Also, if you no show three or more times for appointments you may be dismissed from the clinic at the providers discretion.     Again, thank you for choosing [facility].  Our hope is that these requests will decrease the amount of time that you wait before being seen by our physicians.       _____________________________________________________________  Should you have questions after your visit to [facility], please contact our office at [phone] between the hours of 8:30 a.m. and 4:30 p.m.  Voicemails left after 4:30 p.m. will not be returned until the following business day.  For prescription refill requests, have your pharmacy contact our office.

## 2014-03-10 NOTE — Progress Notes (Signed)
[name], MD [place] [id number]  Colon adenocarcinoma - Plan: CBC with Differential, Comprehensive metabolic panel, CEA, CT Abdomen Pelvis W Contrast, CT Chest W Contrast  CURRENT THERAPY: Stivarga 80 mg daily x 21 days with 7 day respite, starting on 11/20/2013.   INTERVAL HISTORY: [name] 74 y.o. 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 She is status post RFA ablation initially of single liver lesion in August 2012 followed by 6 cycles of FOLFOX/bevacizumab. That was completed on 04/03/2011. She then had a rising CEA level with recurrent disease in the liver and probably in the lung with small non-hypermetabolic pulmonary nodules. Therefore, she was started on FOLFIRI + Avastin from 01/16/2012- 04/08/2012 at which time her quality of life was declining and she requested a break from therapy.   She is currently on Stivarga    Colon adenocarcinoma   11/04/2013 Progression CT CAP and head- Interval progression of disease within the chest, abdomen. Progression of pulmonary metastasis. Increase in size of right hepatic lobe metastasis. Progression of retroperitoneal adenopathy and peritoneal adenopathy  metastasis   11/20/2013 -  Chemotherapy Stivarga 80 mg daily 21 days on and 7 days off    Past Medical History  Diagnosis Date  . Colon cancer     liver ca  . HTN (hypertension)   . Thyroid condition   . Diabetes mellitus   . Shingles   . Acute UTI   . Nausea 02/06/2012  . Port catheter in place 08/15/2012  . Diabetes type 2, controlled 04/13/2012  . Sore throat 02/09/14    has Colon adenocarcinoma; Thyroid condition; Diabetes type 2, controlled; HTN (hypertension), benign; Port catheter in place; Dry mouth; Nausea without vomiting; Trapezius muscle strain; and Reactive depression (situational) on her problem list.     has No Known Allergies.  Ms. [name] does not  currently have medications on file.  Past Surgical History  Procedure Laterality Date  . Colectomy      and bil. oopherectomy  . Appendectomy    . Blt    . Resection liver partial / total w/ radiofrequency ablation    . Port-a-cath removal      has new port now  . Portacath placement  09/2010  . Oophorectomy Bilateral 2007  . Cataract surgery Right 01/10/13  . Cataract surgery Left 01/24/13    Denies any headaches, dizziness, double vision, fevers, chills, night sweats, nausea, vomiting, diarrhea, constipation, chest pain, heart palpitations, shortness of breath, blood in stool, black tarry stool, urinary pain, urinary burning, urinary frequency, hematuria.   PHYSICAL EXAMINATION  ECOG PERFORMANCE STATUS: 1 - Symptomatic but completely ambulatory  Filed Vitals:   03/10/14 1000  BP: 141/69  Pulse: 95  Temp: 98 F (36.7 C)  Resp: 18    GENERAL:alert, no distress, well nourished, well developed, comfortable, cooperative and smiling SKIN: skin color, texture, turgor are normal, no rashes or significant lesions HEAD: Normocephalic, No masses, lesions, tenderness or abnormalities EYES: normal, PERRLA, EOMI, Conjunctiva are pink and non-injected EARS: External ears normal OROPHARYNX:lips, buccal mucosa, and tongue normal and mucous membranes are moist  NECK: supple, no adenopathy, thyroid normal size, non-tender, without nodularity, no stridor, non-tender, trachea midline LYMPH:  no palpable lymphadenopathy BREAST:not examined LUNGS: clear to auscultation and percussion HEART: regular rate & rhythm, no murmurs, no gallops, S1 normal and S2 normal ABDOMEN:abdomen soft, non-tender and normal bowel sounds BACK: Back  symmetric, no curvature., No CVA tenderness EXTREMITIES:less then 2 second capillary refill, no joint deformities, effusion, or inflammation, no edema, no skin discoloration, no clubbing, no cyanosis  NEURO: alert & oriented x 3 with fluent speech, no focal motor/sensory  deficits, gait normal   LABORATORY DATA: CBC    Component Value Date/Time   WBC 8.8 03/09/2014 1203   RBC 4.24 03/09/2014 1203   HGB 13.8 03/09/2014 1203   HCT 41.0 03/09/2014 1203   PLT 242 03/09/2014 1203   MCV 96.7 03/09/2014 1203   MCH 32.5 03/09/2014 1203   MCHC 33.7 03/09/2014 1203   RDW 14.1 03/09/2014 1203   LYMPHSABS 1.7 03/09/2014 1203   MONOABS 0.8 03/09/2014 1203   EOSABS 0.1 03/09/2014 1203   BASOSABS 0.0 03/09/2014 1203      Chemistry      Component Value Date/Time   NA 137 03/09/2014 1203   K 3.9 03/09/2014 1203   CL 102 03/09/2014 1203   CO2 25 03/09/2014 1203   BUN 7 03/09/2014 1203   CREATININE 0.59 03/09/2014 1203      Component Value Date/Time   CALCIUM 8.9 03/09/2014 1203   ALKPHOS 47 03/09/2014 1203   AST 25 03/09/2014 1203   ALT 36* 03/09/2014 1203   BILITOT 0.6 03/09/2014 1203      Lab Results  Component Value Date   CEA 93.0* 03/09/2014   CLINICAL DATA: Restaging metastatic colon cancer.  EXAM: CT CHEST, ABDOMEN, AND PELVIS WITH CONTRAST  TECHNIQUE: Multidetector CT imaging of the chest, abdomen and pelvis was performed following the standard protocol during bolus administration of intravenous contrast.  CONTRAST: 18m OMNIPAQUE IOHEXOL 300 MG/ML SOLN  COMPARISON: 11/04/2013 IMPRESSION: 1. No acute findings within the chest abdomen or pelvis. 2. Interval response to therapy. Specifically, the dominant lesion within the liver has decreased in size from previous exam. Upper abdominal adenopathy is also improved in the interval. The pulmonary nodules are stable in size but appear more cavitary suggesting response to therapy. Stable peritoneal implant. 3. Atherosclerotic disease including multi vessel Coronary artery calcification.   Electronically Signed  By: TKerby MoorsM.D.  On: 03/09/2014 10:45   ASSESSMENT AND PLAN:  Colon adenocarcinoma [name] 74year old female with stage IV colon cancer.  Currently on Stivarga. Her CEA has been increasing but CT scans indicate disease response. Clinically she is doing well. I discussed with her some of my experience with Stivarga. She is concerned about her tumor marker increasing and I advised her it may be an early sign of pending treatment failure. I have recommended however she continue her current treatment. We will obtain CT scans in approximately 8 weeks to reassess. If her scans look stable again, and she is clinically doing well, I would encourage her to continue with therapy. I agree with her that her tumor marker has been very reliable, however I emphasized that we cannot make decisions based merely on a number. If she has evidence of disease progression at follow-up based on imaging and or clinical status we will recommend other treatment options. She is comfortable with the plan as detailed.   All questions were answered. The patient knows to call the clinic with any problems, questions or concerns. We can certainly see the patient much sooner if necessary. Note was electronically signed PMolli Hazard3/08/2014

## 2014-03-10 NOTE — Assessment & Plan Note (Signed)
Pleasant 74 year old female with stage IV colon cancer. Currently on Stivarga. Her CEA has been increasing but CT scans indicate disease response. Clinically she is doing well. I discussed with her some of my experience with Stivarga. She is concerned about her tumor marker increasing and I advised her it may be an early sign of pending treatment failure. I have recommended however she continue her current treatment. We will obtain CT scans in approximately 8 weeks to reassess. If her scans look stable again, and she is clinically doing well, I would encourage her to continue with therapy. I agree with her that her tumor marker has been very reliable, however I emphasized that we cannot make decisions based merely on a number. If she has evidence of disease progression at follow-up based on imaging and or clinical status we will recommend other treatment options. She is comfortable with the plan as detailed.

## 2014-03-12 ENCOUNTER — Other Ambulatory Visit (HOSPITAL_COMMUNITY): Payer: Medicare Other

## 2014-03-12 ENCOUNTER — Ambulatory Visit (HOSPITAL_COMMUNITY): Payer: BLUE CROSS/BLUE SHIELD | Admitting: Hematology & Oncology

## 2014-03-30 ENCOUNTER — Other Ambulatory Visit (HOSPITAL_COMMUNITY): Payer: Self-pay | Admitting: Oncology

## 2014-03-30 DIAGNOSIS — C189 Malignant neoplasm of colon, unspecified: Secondary | ICD-10-CM

## 2014-03-30 MED ORDER — REGORAFENIB 40 MG PO TABS: ORAL_TABLET | ORAL | Status: DC

## 2014-04-04 ENCOUNTER — Emergency Department (HOSPITAL_COMMUNITY): Payer: Medicare Other

## 2014-04-04 ENCOUNTER — Emergency Department (HOSPITAL_COMMUNITY)
Admission: EM | Admit: 2014-04-04 | Discharge: 2014-04-04 | Disposition: A | Discharge status: Home / Self Care | Payer: Medicare Other | Attending: Emergency Medicine | Admitting: Emergency Medicine

## 2014-04-04 ENCOUNTER — Encounter (HOSPITAL_COMMUNITY): Payer: Self-pay

## 2014-04-04 DIAGNOSIS — R109 Unspecified abdominal pain: Secondary | ICD-10-CM

## 2014-04-04 DIAGNOSIS — E119 Type 2 diabetes mellitus without complications: Secondary | ICD-10-CM | POA: Insufficient documentation

## 2014-04-04 DIAGNOSIS — Z9049 Acquired absence of other specified parts of digestive tract: Secondary | ICD-10-CM | POA: Diagnosis not present

## 2014-04-04 DIAGNOSIS — Z79899 Other long term (current) drug therapy: Secondary | ICD-10-CM | POA: Insufficient documentation

## 2014-04-04 DIAGNOSIS — R350 Frequency of micturition: Secondary | ICD-10-CM | POA: Diagnosis present

## 2014-04-04 DIAGNOSIS — I10 Essential (primary) hypertension: Secondary | ICD-10-CM | POA: Diagnosis not present

## 2014-04-04 DIAGNOSIS — R1031 Right lower quadrant pain: Secondary | ICD-10-CM | POA: Insufficient documentation

## 2014-04-04 DIAGNOSIS — N390 Urinary tract infection, site not specified: Secondary | ICD-10-CM | POA: Diagnosis not present

## 2014-04-04 DIAGNOSIS — Z792 Long term (current) use of antibiotics: Secondary | ICD-10-CM | POA: Diagnosis not present

## 2014-04-04 DIAGNOSIS — Z8709 Personal history of other diseases of the respiratory system: Secondary | ICD-10-CM | POA: Diagnosis not present

## 2014-04-04 DIAGNOSIS — Z85038 Personal history of other malignant neoplasm of large intestine: Secondary | ICD-10-CM | POA: Insufficient documentation

## 2014-04-04 LAB — CBC WITH DIFFERENTIAL/PLATELET
BASOS ABS: 0 10*3/uL (ref 0.0–0.1)
Basophils Relative: 0 % (ref 0–1)
Eosinophils Absolute: 0.1 10*3/uL (ref 0.0–0.7)
Eosinophils Relative: 1 % (ref 0–5)
HCT: 36.4 % (ref 36.0–46.0)
HEMOGLOBIN: 11.9 g/dL — AB (ref 12.0–15.0)
LYMPHS ABS: 0.4 10*3/uL — AB (ref 0.7–4.0)
Lymphocytes Relative: 3 % — ABNORMAL LOW (ref 12–46)
MCH: 32 pg (ref 26.0–34.0)
MCHC: 32.7 g/dL (ref 30.0–36.0)
MCV: 97.8 fL (ref 78.0–100.0)
MONO ABS: 1.2 10*3/uL — AB (ref 0.1–1.0)
Monocytes Relative: 8 % (ref 3–12)
Neutro Abs: 12.3 10*3/uL — ABNORMAL HIGH (ref 1.7–7.7)
Neutrophils Relative %: 88 % — ABNORMAL HIGH (ref 43–77)
Platelets: 199 10*3/uL (ref 150–400)
RBC: 3.72 MIL/uL — AB (ref 3.87–5.11)
RDW: 14.2 % (ref 11.5–15.5)
WBC: 14 10*3/uL — AB (ref 4.0–10.5)

## 2014-04-04 LAB — COMPREHENSIVE METABOLIC PANEL
ALK PHOS: 42 U/L (ref 39–117)
ALT: 30 U/L (ref 0–35)
AST: 31 U/L (ref 0–37)
Albumin: 3.3 g/dL — ABNORMAL LOW (ref 3.5–5.2)
Anion gap: 9 (ref 5–15)
BILIRUBIN TOTAL: 0.9 mg/dL (ref 0.3–1.2)
BUN: 15 mg/dL (ref 6–23)
CHLORIDE: 101 mmol/L (ref 96–112)
CO2: 23 mmol/L (ref 19–32)
Calcium: 8.1 mg/dL — ABNORMAL LOW (ref 8.4–10.5)
Creatinine, Ser: 0.82 mg/dL (ref 0.50–1.10)
GFR calc Af Amer: 81 mL/min — ABNORMAL LOW (ref >90)
GFR calc non Af Amer: 70 mL/min — ABNORMAL LOW (ref >90)
GLUCOSE: 192 mg/dL — AB (ref 70–99)
Potassium: 4.2 mmol/L (ref 3.5–5.1)
Sodium: 133 mmol/L — ABNORMAL LOW (ref 135–145)
Total Protein: 6.3 g/dL (ref 6.0–8.3)

## 2014-04-04 LAB — URINALYSIS, ROUTINE W REFLEX MICROSCOPIC
GLUCOSE, UA: 250 mg/dL — AB
Ketones, ur: 15 mg/dL — AB
NITRITE: POSITIVE — AB
Protein, ur: 100 mg/dL — AB
Specific Gravity, Urine: 1.025 (ref 1.005–1.030)
UROBILINOGEN UA: 4 mg/dL — AB (ref 0.0–1.0)
pH: 6 (ref 5.0–8.0)

## 2014-04-04 LAB — URINE MICROSCOPIC-ADD ON

## 2014-04-04 LAB — LIPASE, BLOOD: LIPASE: 18 U/L (ref 11–59)

## 2014-04-04 MED ORDER — ONDANSETRON HCL 4 MG/2ML IJ SOLN
4.0000 mg | Freq: Once | INTRAMUSCULAR | Status: AC
  Administered 2014-04-04: 4 mg via INTRAVENOUS
  Filled 2014-04-04: qty 2

## 2014-04-04 MED ORDER — HEPARIN SOD (PORK) LOCK FLUSH 100 UNIT/ML IV SOLN
INTRAVENOUS | Status: AC
  Filled 2014-04-04: qty 5

## 2014-04-04 MED ORDER — OXYCODONE-ACETAMINOPHEN 5-325 MG PO TABS
1.0000 | ORAL_TABLET | ORAL | Status: DC | PRN
Start: 2014-04-04 — End: 2014-07-03

## 2014-04-04 MED ORDER — IOHEXOL 300 MG/ML  SOLN
25.0000 mL | Freq: Once | INTRAMUSCULAR | Status: AC | PRN
  Administered 2014-04-04: 25 mL via ORAL

## 2014-04-04 MED ORDER — IOHEXOL 300 MG/ML  SOLN
100.0000 mL | Freq: Once | INTRAMUSCULAR | Status: AC | PRN
  Administered 2014-04-04: 100 mL via INTRAVENOUS

## 2014-04-04 MED ORDER — DEXTROSE 5 % IV SOLN
1.0000 g | Freq: Once | INTRAVENOUS | Status: AC
  Administered 2014-04-04: 1 g via INTRAVENOUS
  Filled 2014-04-04: qty 10

## 2014-04-04 MED ORDER — MORPHINE SULFATE 4 MG/ML IJ SOLN
4.0000 mg | Freq: Once | INTRAMUSCULAR | Status: AC
  Administered 2014-04-04: 4 mg via INTRAVENOUS
  Filled 2014-04-04: qty 1

## 2014-04-04 MED ORDER — ONDANSETRON 8 MG PO TBDP
ORAL_TABLET | ORAL | Status: AC
  Filled 2014-04-04: qty 1

## 2014-04-04 MED ORDER — SODIUM CHLORIDE 0.9 % IV BOLUS (SEPSIS)
500.0000 mL | Freq: Once | INTRAVENOUS | Status: AC
Start: 2014-04-04 — End: 2014-04-04
  Administered 2014-04-04: 500 mL via INTRAVENOUS

## 2014-04-04 MED ORDER — CEPHALEXIN 500 MG PO CAPS: 500.0000 mg | ORAL_CAPSULE | Freq: Four times a day (QID) | ORAL | Status: DC

## 2014-04-04 MED ORDER — ONDANSETRON 8 MG PO TBDP
8.0000 mg | ORAL_TABLET | Freq: Once | ORAL | Status: AC
  Administered 2014-04-04: 8 mg via ORAL

## 2014-04-04 NOTE — ED Provider Notes (Signed)
CSN: [id number]     Arrival date & time 04/04/14  4098 History  This chart was scribed for Nat Christen, MD by Erling Conte, ED Scribe. This patient was seen in room APA07/APA07 and the patient's care was started at 10:22 AM.    Chief Complaint  Patient presents with  . Urinary Frequency    The history is provided by the patient and the spouse. No language interpreter was used.    HPI Comments: [name] is a 74 y.o. female who presents to the Emergency Department complaining of constant, moderate, gradually worsening right flank pain. She is having associated dysuria, mild hematuria, chills, nausea and urinary urgency. Pt states she went to go see Dr. Edrick Oh (PCP) last week and he diagnosed her with a UTI and prescribed her with Cipro. She states her urine was cultured and Dr. Edrick Oh switched her antibiotic to Septra DS which she has been taking for 1 week. Pt also notes she has a sore neck. She denies any injury to the area. Pt is current receiving chemotherapy treatment for her colon cancer. She had a scan done recently and it showed that her cancer was improving. Her last chemotherapy treatment was 2 days ago. She denies any h/o kidney stones. She denies any fever or emesis   Past Medical History  Diagnosis Date  . Colon cancer     liver ca  . HTN (hypertension)   . Thyroid condition   . Diabetes mellitus   . Shingles   . Acute UTI   . Nausea 02/06/2012  . Port catheter in place 08/15/2012  . Diabetes type 2, controlled 04/13/2012  . Sore throat 02/09/14   Past Surgical History  Procedure Laterality Date  . Colectomy      and bil. oopherectomy  . Appendectomy    . Blt    . Resection liver partial / total w/ radiofrequency ablation    . Port-a-cath removal      has new port now  . Portacath placement  09/2010  . Oophorectomy Bilateral 2007  . Cataract surgery Right 01/10/13  . Cataract surgery Left 01/24/13   Family History  Problem Relation Age of Onset  . Diabetes Mother    . Hypertension Mother   . Hypertension Father   . Cancer Son    History  Substance Use Topics  . Smoking status: Never Smoker   . Smokeless tobacco: Never Used  . Alcohol Use: No   OB History    No data available     Review of Systems  Constitutional: Positive for chills. Negative for fever.  Gastrointestinal: Positive for nausea. Negative for vomiting.  Genitourinary: Positive for dysuria, urgency, hematuria and flank pain.  A complete 10 system review of systems was obtained and all systems are negative except as noted in the HPI and PMH.     Allergies  Review of patient's allergies indicates no known allergies.  Home Medications   Prior to Admission medications   Medication Sig Start Date End Date Taking? Authorizing Provider  ciprofloxacin (CIPRO) 250 MG tablet Take 250 mg by mouth 2 (two) times daily. Started 03/20/14 for 10 days   Yes Historical Provider, MD  dorzolamide-timolol (COSOPT) 22.3-6.8 MG/ML ophthalmic solution Place 1 drop into both eyes 2 (two) times daily.    Yes Historical Provider, MD  glyBURIDE-metformin (GLUCOVANCE) 5-500 MG per tablet Take 1 tablet by mouth 2 (two) times daily with a meal.   Yes Historical Provider, MD  phenazopyridine (PYRIDIUM) 100 MG  tablet Take 100 mg by mouth 3 (three) times daily as needed for pain.   Yes Historical Provider, MD  sulfamethoxazole-trimethoprim (BACTRIM DS,SEPTRA DS) 800-160 MG per tablet Take 1 tablet by mouth 2 (two) times daily. Started 03/20/14 for 10 days   Yes Historical Provider, MD  cephALEXin (KEFLEX) 500 MG capsule Take 1 capsule (500 mg total) by mouth 4 (four) times daily. 04/04/14   Nat Christen, MD  cyclobenzaprine (FLEXERIL) 10 MG tablet Take 1 tablet (10 mg total) by mouth 3 (three) times daily as needed for muscle spasms. Patient not taking: Reported on 04/04/2014 02/12/14   Baird Cancer, PA-C  ibuprofen (ADVIL,MOTRIN) 200 MG tablet Take 200 mg by mouth every 6 (six) hours as needed for mild pain.      Historical Provider, MD  LORazepam (ATIVAN) 0.5 MG tablet Take 2 tablets (1 mg total) by mouth every 8 (eight) hours as needed for anxiety or sleep. 10/29/13   Baird Cancer, PA-C  ondansetron (ZOFRAN) 8 MG tablet Take 1 tablet (8 mg total) by mouth every 8 (eight) hours as needed for nausea or vomiting. Patient not taking: Reported on 04/04/2014 01/15/14   Baird Cancer, PA-C  regorafenib (STIVARGA) 40 MG tablet Take 80 mg with low fat meal, 21 days on and 7 day respite. Caution: Chemotherapy. 03/30/14   Baird Cancer, PA-C   Triage Vitals: BP 138/76 mmHg  Pulse 87  Temp(Src) 98.4 F (36.9 C) (Oral)  Resp 16  Ht 5\' 5"  (1.651 m)  Wt 159 lb (72.122 kg)  BMI 26.46 kg/m2  SpO2 100%  Physical Exam  Constitutional: She is oriented to person, place, and time. She appears well-developed and well-nourished.  HENT:  Head: Normocephalic and atraumatic.  Eyes: Conjunctivae and EOM are normal. Pupils are equal, round, and reactive to light.  Neck: Normal range of motion. Neck supple.  Cardiovascular: Normal rate and regular rhythm.   Pulmonary/Chest: Effort normal and breath sounds normal.  Abdominal: Soft. Bowel sounds are normal.  Tender to right flank  Musculoskeletal: Normal range of motion.  Neurological: She is alert and oriented to person, place, and time.  Skin: Skin is warm and dry.  Psychiatric: She has a normal mood and affect. Her behavior is normal.  Nursing note and vitals reviewed.   ED Course  Procedures (including critical care time)   DIAGNOSTIC STUDIES: Oxygen Saturation is 100% on RA, normal by my interpretation.    COORDINATION OF CARE:  10:31 AM Pt has been on 2 antibiotics and infection still present, will order abdominal CT. Pt advised of plan for treatment and pt agrees.  Labs Review Labs Reviewed  URINALYSIS, ROUTINE W REFLEX MICROSCOPIC - Abnormal; Notable for the following:    Color, Urine ORANGE (*)    APPearance CLOUDY (*)    Glucose, UA 250 (*)     Hgb urine dipstick MODERATE (*)    Bilirubin Urine MODERATE (*)    Ketones, ur 15 (*)    Protein, ur 100 (*)    Urobilinogen, UA 4.0 (*)    Nitrite POSITIVE (*)    Leukocytes, UA MODERATE (*)    All other components within normal limits  URINE MICROSCOPIC-ADD ON - Abnormal; Notable for the following:    Squamous Epithelial / LPF MANY (*)    Bacteria, UA MANY (*)    All other components within normal limits  COMPREHENSIVE METABOLIC PANEL - Abnormal; Notable for the following:    Sodium 133 (*)    Glucose,  Bld 192 (*)    Calcium 8.1 (*)    Albumin 3.3 (*)    GFR calc non Af Amer 70 (*)    GFR calc Af Amer 81 (*)    All other components within normal limits  CBC WITH DIFFERENTIAL/PLATELET - Abnormal; Notable for the following:    WBC 14.0 (*)    RBC 3.72 (*)    Hemoglobin 11.9 (*)    Neutrophils Relative % 88 (*)    Neutro Abs 12.3 (*)    Lymphocytes Relative 3 (*)    Lymphs Abs 0.4 (*)    Monocytes Absolute 1.2 (*)    All other components within normal limits  URINE CULTURE  LIPASE, BLOOD    Imaging Review Ct Abdomen Pelvis W Contrast  04/04/2014   CLINICAL DATA:  Right-sided flank pain, history of colon carcinoma with metastatic disease  EXAM: CT ABDOMEN AND PELVIS WITH CONTRAST  TECHNIQUE: Multidetector CT imaging of the abdomen and pelvis was performed using the standard protocol following bolus administration of intravenous contrast.  CONTRAST:  63mL OMNIPAQUE IOHEXOL 300 MG/ML SOLN, 172mL OMNIPAQUE IOHEXOL 300 MG/ML SOLN  COMPARISON:  03/09/2014  FINDINGS: The lung bases demonstrate no focal infiltrate. A nodular density is again noted in the right lower lobe stable from the previous exam. Coronary calcifications are seen. A large hiatal hernia is noted.  The liver demonstrates diffuse fatty infiltration. There is a large lesion near the dome of the liver measuring approximately 5.7 cm in greatest dimension. It is roughly stable from the prior exam given some variation in  the technical factors of the examination. The spleen, pancreas, gallbladder and adrenal glands are stable in appearance from the prior exam.  The previously seen right retrocaval lymph node is again identified measuring 17 mm in short axis. This is again roughly similar to that seen on the recent exam. Bladder is partially distended. No pelvic mass lesion is seen. No significant lymphadenopathy is noted. Aortoiliac calcifications are seen.  IMPRESSION: Stable changes of hepatic and lymphatic metastatic disease similar to that seen on the prior exam. No new focal abnormality is seen.   Electronically Signed   By: Inez Catalina M.D.   On: 04/04/2014 13:37     EKG Interpretation None      MDM   Final diagnoses:  Right flank pain  UTI (lower urinary tract infection)   Patient is hemodynamically stable. Urinalysis reveals evidence of infection. Urine culture obtained. IV Rocephin 1 g. Will discharge home on Keflex 500 mg. CT abdomen pelvis shows stable hepatic and lymphatic metastatic disease similar to prior exam. Patient has primary care follow-up.  I personally performed the services described in this documentation, which was scribed in my presence. The recorded information has been reviewed and is accurate.     Nat Christen, MD 04/04/14 1431

## 2014-04-04 NOTE — Discharge Instructions (Signed)
Start new antibiotic Sunday afternoon. We have given you an intravenous antibiotic here which will last for approximately 24 hours. Also prescription for pain medicine. Follow-up your primary care doctor. He will be able to assess all the test results from the computer.

## 2014-04-04 NOTE — ED Notes (Signed)
Pt c/o of urinary frequency with burning. States has been to PCP for UTI and currently on antibiotic therapy. Colon CA patient getting chemotherapy last treatment 04/02/14

## 2014-04-07 LAB — URINE CULTURE

## 2014-04-08 ENCOUNTER — Telehealth: Payer: Self-pay | Admitting: Emergency Medicine

## 2014-04-08 NOTE — Telephone Encounter (Signed)
Post ED Visit - Positive Culture Follow-up  Culture report reviewed by antimicrobial stewardship pharmacist: []  [name], Pharm.D., BCPS []  [name], Pharm.D., BCPS []  [name], Pharm.D., BCPS []  [name], Pharm.D., BCPS, AAHIVP []  [name], Pharm.D., BCPS, AAHIVP []  [name], Pharm.D., BCPS  Positive Urine culture Treated with Cephalexin, organism sensitive to the same and no further patient follow-up is required at this time.  [name] 04/08/2014, 5:38 PM

## 2014-04-09 ENCOUNTER — Encounter (HOSPITAL_COMMUNITY): Payer: BLUE CROSS/BLUE SHIELD

## 2014-04-21 ENCOUNTER — Encounter (HOSPITAL_COMMUNITY): Payer: Medicare Other

## 2014-05-07 ENCOUNTER — Encounter (HOSPITAL_COMMUNITY): Payer: Medicare Other | Attending: Internal Medicine

## 2014-05-07 ENCOUNTER — Ambulatory Visit (HOSPITAL_COMMUNITY)
Admission: RE | Admit: 2014-05-07 | Discharge: 2014-05-07 | Disposition: A | Discharge status: Home / Self Care | Payer: Medicare Other | Source: Ambulatory Visit | Attending: Hematology & Oncology | Admitting: Hematology & Oncology

## 2014-05-07 ENCOUNTER — Other Ambulatory Visit (HOSPITAL_COMMUNITY): Payer: Self-pay | Admitting: Oncology

## 2014-05-07 DIAGNOSIS — C189 Malignant neoplasm of colon, unspecified: Secondary | ICD-10-CM

## 2014-05-07 DIAGNOSIS — C7800 Secondary malignant neoplasm of unspecified lung: Secondary | ICD-10-CM | POA: Insufficient documentation

## 2014-05-07 DIAGNOSIS — C19 Malignant neoplasm of rectosigmoid junction: Secondary | ICD-10-CM

## 2014-05-07 DIAGNOSIS — Z9889 Other specified postprocedural states: Secondary | ICD-10-CM | POA: Insufficient documentation

## 2014-05-07 DIAGNOSIS — I712 Thoracic aortic aneurysm, without rupture: Secondary | ICD-10-CM | POA: Insufficient documentation

## 2014-05-07 DIAGNOSIS — M5136 Other intervertebral disc degeneration, lumbar region: Secondary | ICD-10-CM | POA: Diagnosis not present

## 2014-05-07 DIAGNOSIS — Z452 Encounter for adjustment and management of vascular access device: Secondary | ICD-10-CM | POA: Diagnosis not present

## 2014-05-07 DIAGNOSIS — Z08 Encounter for follow-up examination after completed treatment for malignant neoplasm: Secondary | ICD-10-CM | POA: Insufficient documentation

## 2014-05-07 DIAGNOSIS — R932 Abnormal findings on diagnostic imaging of liver and biliary tract: Secondary | ICD-10-CM | POA: Diagnosis not present

## 2014-05-07 DIAGNOSIS — Z95828 Presence of other vascular implants and grafts: Secondary | ICD-10-CM

## 2014-05-07 LAB — COMPREHENSIVE METABOLIC PANEL
ALBUMIN: 3.6 g/dL (ref 3.5–5.0)
ALK PHOS: 38 U/L (ref 38–126)
ALT: 35 U/L (ref 14–54)
ANION GAP: 10 (ref 5–15)
AST: 28 U/L (ref 15–41)
BUN: 7 mg/dL (ref 6–20)
CALCIUM: 8.6 mg/dL — AB (ref 8.9–10.3)
CO2: 24 mmol/L (ref 22–32)
Chloride: 101 mmol/L (ref 101–111)
Creatinine, Ser: 0.6 mg/dL (ref 0.44–1.00)
GFR calc Af Amer: >60 mL/min (ref >60)
GFR calc non Af Amer: >60 mL/min (ref >60)
GLUCOSE: 181 mg/dL — AB (ref 70–99)
Potassium: 4.3 mmol/L (ref 3.5–5.1)
SODIUM: 135 mmol/L (ref 135–145)
TOTAL PROTEIN: 6.8 g/dL (ref 6.5–8.1)
Total Bilirubin: 0.7 mg/dL (ref 0.3–1.2)

## 2014-05-07 LAB — CBC WITH DIFFERENTIAL/PLATELET
Basophils Absolute: 0.1 10*3/uL (ref 0.0–0.1)
Basophils Relative: 1 % (ref 0–1)
Eosinophils Absolute: 0.3 10*3/uL (ref 0.0–0.7)
Eosinophils Relative: 4 % (ref 0–5)
HCT: 39.6 % (ref 36.0–46.0)
HEMOGLOBIN: 13.3 g/dL (ref 12.0–15.0)
Lymphocytes Relative: 22 % (ref 12–46)
Lymphs Abs: 1.5 10*3/uL (ref 0.7–4.0)
MCH: 33 pg (ref 26.0–34.0)
MCHC: 33.6 g/dL (ref 30.0–36.0)
MCV: 98.3 fL (ref 78.0–100.0)
MONO ABS: 0.6 10*3/uL (ref 0.1–1.0)
MONOS PCT: 9 % (ref 3–12)
Neutro Abs: 4.6 10*3/uL (ref 1.7–7.7)
Neutrophils Relative %: 65 % (ref 43–77)
Platelets: 198 10*3/uL (ref 150–400)
RBC: 4.03 MIL/uL (ref 3.87–5.11)
RDW: 14 % (ref 11.5–15.5)
WBC: 7.1 10*3/uL (ref 4.0–10.5)

## 2014-05-07 MED ORDER — HEPARIN SOD (PORK) LOCK FLUSH 100 UNIT/ML IV SOLN
INTRAVENOUS | Status: AC
Start: 2014-05-07 — End: 2014-05-07
  Filled 2014-05-07: qty 5

## 2014-05-07 MED ORDER — IOHEXOL 300 MG/ML  SOLN
100.0000 mL | Freq: Once | INTRAMUSCULAR | Status: AC | PRN
  Administered 2014-05-07: 100 mL via INTRAVENOUS

## 2014-05-07 MED ORDER — REGORAFENIB 40 MG PO TABS: ORAL_TABLET | ORAL | Status: DC

## 2014-05-07 MED ORDER — SODIUM CHLORIDE 0.9 % IJ SOLN
10.0000 mL | Freq: Once | INTRAMUSCULAR | Status: AC
  Administered 2014-05-07: 10 mL via INTRAVENOUS

## 2014-05-07 NOTE — Progress Notes (Signed)
PATIENT WAS GIVEN [facility] INSTRUCTIONS AND FAXED TO OFFICE.

## 2014-05-07 NOTE — Patient Instructions (Signed)
[place] at [facility] Discharge Instructions  RECOMMENDATIONS MADE BY THE CONSULTANT AND ANY TEST RESULTS WILL BE SENT TO YOUR REFERRING PHYSICIAN.  Accessed port and obtained lab work as ordered. Radiology may use port for CT scan. Have them flush port and de-access needle before you leave. Return to clinic as scheduled for MD appointment.  Thank you for choosing [place] at [facility] to provide your oncology and hematology care.  To afford each patient quality time with our provider, please arrive at least 15 minutes before your scheduled appointment time.    You need to re-schedule your appointment should you arrive 10 or more minutes late.  We strive to give you quality time with our providers, and arriving late affects you and other patients whose appointments are after yours.  Also, if you no show three or more times for appointments you may be dismissed from the clinic at the providers discretion.     Again, thank you for choosing [facility].  Our hope is that these requests will decrease the amount of time that you wait before being seen by our physicians.       _____________________________________________________________  Should you have questions after your visit to [facility], please contact our office at [phone] between the hours of 8:30 a.m. and 4:30 p.m.  Voicemails left after 4:30 p.m. will not be returned until the following business day.  For prescription refill requests, have your pharmacy contact our office.

## 2014-05-07 NOTE — Progress Notes (Signed)
Accessed port with power port needle. Obtained lab work as ordered. Flushed port with 20 ml only. Left port accessed. Patient is to have CT scan at 10 am. They will flush port and de-access after scan.

## 2014-05-08 LAB — CEA: CEA: 249.8 ng/mL — ABNORMAL HIGH (ref 0.0–4.7)

## 2014-05-11 ENCOUNTER — Encounter (HOSPITAL_COMMUNITY): Payer: Self-pay | Admitting: Hematology & Oncology

## 2014-05-11 ENCOUNTER — Other Ambulatory Visit (HOSPITAL_COMMUNITY): Payer: Self-pay | Admitting: *Deleted

## 2014-05-11 ENCOUNTER — Encounter (HOSPITAL_BASED_OUTPATIENT_CLINIC_OR_DEPARTMENT_OTHER): Payer: Medicare Other | Admitting: Hematology & Oncology

## 2014-05-11 VITALS — BP 175/96 | HR 76 | Temp 97.6°F | Resp 20 | Wt 153.9 lb

## 2014-05-11 DIAGNOSIS — C189 Malignant neoplasm of colon, unspecified: Secondary | ICD-10-CM

## 2014-05-11 DIAGNOSIS — M542 Cervicalgia: Secondary | ICD-10-CM | POA: Diagnosis not present

## 2014-05-11 DIAGNOSIS — C787 Secondary malignant neoplasm of liver and intrahepatic bile duct: Secondary | ICD-10-CM

## 2014-05-11 DIAGNOSIS — C19 Malignant neoplasm of rectosigmoid junction: Secondary | ICD-10-CM

## 2014-05-11 MED ORDER — METHOCARBAMOL 500 MG PO TABS: ORAL_TABLET | ORAL | Status: AC

## 2014-05-11 MED ORDER — TRIFLURIDINE-TIPIRACIL 20-8.19 MG PO TABS: 35.0000 mg/m2 | ORAL_TABLET | Freq: Two times a day (BID) | ORAL | Status: AC

## 2014-05-11 NOTE — Patient Instructions (Signed)
[place] at [facility]  Discharge Instructions:  Exam completed by Dr Whitney Muse today. Stop Stivarga, will send the new prescription to [facility]. Please call us when you start taking the new medication so we can make your follow up appointment for 2 weeks follow that. Please call the clinic if you have any questions or concerns.  _______________________________________________________________  Thank you for choosing [place] at [facility] to provide your oncology and hematology care.  To afford each patient quality time with our providers, please arrive at least 15 minutes before your scheduled appointment.  You need to re-schedule your appointment if you arrive 10 or more minutes late.  We strive to give you quality time with our providers, and arriving late affects you and other patients whose appointments are after yours.  Also, if you no show three or more times for appointments you may be dismissed from the clinic.  Again, thank you for choosing [place] at [place] hope is that these requests will allow you access to exceptional care and in a timely manner. _______________________________________________________________  If you have questions after your visit, please contact our office at [phone] between the hours of 8:30 a.m. and 5:00 p.m. Voicemails left after 4:30 p.m. will not be returned until the following business day. _______________________________________________________________  For prescription refill requests, have your pharmacy contact our office. _______________________________________________________________  Recommendations made by the consultant and any test results will be sent to your referring physician. _______________________________________________________________

## 2014-05-11 NOTE — Progress Notes (Signed)
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 That was completed on 04/03/2011 KRAS mutated FOLFIRI + Avastin from 01/16/2012- 04/08/2012 at which time her quality of life was declining and she requested a break from therapy.    CURRENT THERAPY: Stivarga 80 mg daily x 21 days with 7 day respite, starting on 11/20/2013.   INTERVAL HISTORY: [name] 74 y.o. 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 She is status post RFA ablation initially of single liver lesion in August 2012 followed by 6 cycles of FOLFOX/bevacizumab. That was completed on 04/03/2011. She then had a rising CEA level with recurrent disease in the liver and probably in the lung with small non-hypermetabolic pulmonary nodules. Therefore, she was started on FOLFIRI + Avastin from 01/16/2012- 04/08/2012 at which time her quality of life was declining and she requested a break from therapy.   She is currently on Stivarga. She is here today for review of recent labs including CEA and restaging CT scans.  She has been experiencing daily neck pain in the top right shoulder/neck. Her current pain medication, Percocet, does not alleviate this pain. She has been given Flexeril with no relief. She says her appetite is "so-so" but overall unchanged.     Colon adenocarcinoma   11/04/2013 Progression CT CAP and head- Interval progression of disease within the chest, abdomen. Progression of pulmonary metastasis. Increase in size of right hepatic lobe metastasis. Progression of retroperitoneal adenopathy and peritoneal adenopathy  metastasis   11/20/2013 -  Chemotherapy Stivarga 80 mg daily 21 days on and 7 days off    Past Medical History  Diagnosis Date  . Colon cancer    liver ca  . HTN (hypertension)   . Thyroid condition   . Diabetes mellitus   . Shingles   . Acute UTI   . Nausea 02/06/2012  . Port catheter in place 08/15/2012  . Diabetes type 2, controlled 04/13/2012  . Sore throat 02/09/14    has Colon adenocarcinoma; Thyroid condition; Diabetes type 2, controlled; HTN (hypertension), benign; Port catheter in place; Dry mouth; Nausea without vomiting; Trapezius muscle strain; and Reactive depression (situational) on her problem list.     has No Known Allergies.  Ms. Taff had no medications administered during this visit.  Past Surgical History  Procedure Laterality Date  . Colectomy      and bil. oopherectomy  . Appendectomy    . Blt    . Resection liver partial / total w/ radiofrequency ablation    . Port-a-cath removal      has new port now  . Portacath placement  09/2010  . Oophorectomy Bilateral 2007  . Cataract surgery Right 01/10/13  . Cataract surgery Left 01/24/13    Denies any headaches, dizziness, double vision, fevers, chills, night sweats, nausea, vomiting, diarrhea, constipation, chest pain, heart palpitations, shortness of breath, blood in stool, black tarry stool, urinary pain, urinary burning, urinary frequency, hematuria. Denies abdominal pain. Positive for neck pain.       Right neck. Experiences this pain daily. Not alleviated by Percocet.  PHYSICAL EXAMINATION   ECOG PERFORMANCE STATUS: 1 - Symptomatic but completely ambulatory  Filed Vitals:   05/11/14 1100  BP: 175/96  Pulse:   Temp:   Resp:  GENERAL:alert, no distress, well nourished, well developed, comfortable, cooperative and smiling SKIN: skin color, texture, turgor are normal, no rashes or significant lesions HEAD: Normocephalic, No masses, lesions, tenderness or abnormalities EYES: normal, PERRLA, EOMI, Conjunctiva are pink and non-injected EARS: External ears normal OROPHARYNX:lips, buccal mucosa, and tongue normal and mucous membranes are moist    NECK: supple, no adenopathy, thyroid normal size, non-tender, without nodularity, no stridor, non-tender, trachea midline     Right neck tender on palpation. LYMPH:  no palpable lymphadenopathy BREAST:not examined LUNGS: clear to auscultation and percussion HEART: regular rate & rhythm, no murmurs, no gallops, S1 normal and S2 normal ABDOMEN:abdomen soft, non-tender and normal bowel sounds BACK: Back symmetric, no curvature., No CVA tenderness EXTREMITIES:less then 2 second capillary refill, no joint deformities, effusion, or inflammation, no edema, no skin discoloration, no clubbing, no cyanosis  NEURO: alert & oriented x 3 with fluent speech, no focal motor/sensory deficits, gait normal   LABORATORY DATA: CBC    Component Value Date/Time   WBC 7.1 05/07/2014 0930   RBC 4.03 05/07/2014 0930   HGB 13.3 05/07/2014 0930   HCT 39.6 05/07/2014 0930   PLT 198 05/07/2014 0930   MCV 98.3 05/07/2014 0930   MCH 33.0 05/07/2014 0930   MCHC 33.6 05/07/2014 0930   RDW 14.0 05/07/2014 0930   LYMPHSABS 1.5 05/07/2014 0930   MONOABS 0.6 05/07/2014 0930   EOSABS 0.3 05/07/2014 0930   BASOSABS 0.1 05/07/2014 0930      Chemistry      Component Value Date/Time   NA 135 05/07/2014 0930   K 4.3 05/07/2014 0930   CL 101 05/07/2014 0930   CO2 24 05/07/2014 0930   BUN 7 05/07/2014 0930   CREATININE 0.60 05/07/2014 0930      Component Value Date/Time   CALCIUM 8.6* 05/07/2014 0930   ALKPHOS 38 05/07/2014 0930   AST 28 05/07/2014 0930   ALT 35 05/07/2014 0930   BILITOT 0.7 05/07/2014 0930      Lab Results  Component Value Date   CEA 249.8* 05/07/2014   RADIOLOGY:  CLINICAL DATA: Metastatic colorectal adenocarcinoma, stage IV. Restaging.  EXAM: CT CHEST, ABDOMEN, AND PELVIS WITH CONTRAST  TECHNIQUE: Multidetector CT imaging of the chest, abdomen and pelvis was performed following the standard protocol during bolus administration of intravenous contrast.  CONTRAST: 146m  OMNIPAQUE IOHEXOL 300 MG/ML SOLN  COMPARISON: 03/09/2014  FINDINGS: CT CHEST FINDINGS  Mediastinum/Nodes: Coronary atherosclerosis. Ascending aortic aneurysm, 4.3 cm diameter. Small to moderate-sized type 1 hiatal hernia.  Lungs/Pleura: Right upper lobe nodule 1.1 by 0.9 cm on image 14 series 6, formerly 1.2 by 1.1 cm by my measurements.  Cavitary posterior right upper lobe nodule 1.4 by 1.1 cm, formerly the same by my measurements. Left upper lobe cavitary nodule 1.8 by 1.6 cm, formerly 1.7 by 1.6 cm by my measurements.  Other scattered nodules, most of which are cavitary, are stable.  Musculoskeletal: Unremarkable  CT ABDOMEN PELVIS FINDINGS  Hepatobiliary: The dominant right hepatic lobe mass measures 5.5 by 5.0 cm on image 41 of series 2, previously 5.5 by 4.7 cm when measured in the same fashion. New hypodense 8 mm nodule or mass in the dome of the liver on image 40 of series 2. New 0.9 by 0.7 cm hypodense lesion in segment 7 of the liver, image 44 series 2. Suspected 4 mm lesion in the lateral segment left hepatic lobe, image 48 series 2, essentially stable. Fatty infiltration adjacent to the falciform ligament.  Pancreas:  Unremarkable  Spleen: Unremarkable  Adrenals/Urinary Tract: Stable fullness of the right adrenal gland. Otherwise unremarkable.  Stomach/Bowel: Mild wall thickening in the rectum.  Vascular/Lymphatic: Pathologic retrocaval lymph node 1.8 cm in short axis on image 60 of series 2, formerly the same when measured in the same fashion. 0.8 cm lymph node between the right hemidiaphragmatic crus and the IVC on image 54 of series 2, formerly 0.6 cm.  Aortoiliac atherosclerotic vascular disease.  Reproductive: Unremarkable  Other: Right omental nodule 1.8 by 1.0 cm on image 81 series 2, formerly 1.6 by 1.0 by my measurements. This is oriented slightly differently than on the prior exam which might account for some measurement  variation.  Presacral edema and perirectal stranding, as before, might be therapy related.  Musculoskeletal: Spondylosis and degenerative disc disease at the L4-5 and L5-S1 levels may be causing impingement. Calcified nodularity along a laparotomy scar, image 92 series 2, stable and probably incidental.  IMPRESSION: 1. The dominant right hepatic lobe mass is mildly increased in size, and there several new hypodense lesions in the liver which probably represent additional metastatic foci. 2. Otherwise essentially stable metastatic disease to the lungs, retroperitoneum, and right omentum. 3. Perirectal and presacral stranding may be therapy related. 4. Potential mild impingement at L4-5 and L5-S1 due to spondylosis and degenerative disc disease. 5. Considerable atherosclerosis. 4.3 cm in diameter ascending aortic aneurysm. This can be monitored using followup CT of the chest from restaging purposes; in the absence of restaging CTs, guidelines recommend annual imaging followup by CTA or MRA. This recommendation follows 2010 ACCF/AHA/AATS/ACR/ASA/SCA/SCAI/SIR/STS/SVM Guidelines for the Diagnosis and Management of Patients with Thoracic Aortic Disease. Circulation. 2010; 121: e266-e369  Electronically Signed  By: Walter Liebkemann M.D.  On: 05/07/2014 10:55 CLINICAL DATA: Metastatic colorectal adenocarcinoma, stage IV. Restaging.    ASSESSMENT AND PLAN:  Stage IV colorectal cancer with liver metastases Previous treatment with FOLFOX and Avastin, FOLFIRI K-ras mutated R sided neck pain  I reviewed the results of her CT scans in conjunction with her laboratory studies showing a rising CEA. I have advised her that Stivarga therapy is no longer benefiting. We talked about doing no additional therapy and she had questions regarding "how long she may have." We discussed hospice and palliative medicine and I advised her of the many benefits of an early hospice referral should she  opt for no therapy. We talked about Lonsurf therapy I reviewed some of the major side effects including blood count abnormalities. She would like to try Lonsurf. We will make arrangements to obtain the medication. Once she has the medication  in hand we will arrange for formal teaching. We will plan on seeing her back 2 weeks after she starts therapy at which we will assess tolerance. If she has any problems prior to follow-up she has been advised to call.  I have called her in a prescription for robaxin. I advised her if her neck pain worsens to let us know.  All questions were answered. The patient knows to call the clinic with any problems, questions or concerns. We can certainly see the patient much sooner if necessary. Note was electronically signed  This document serves as a record of services personally performed by Shannon Penland, MD. It was created on her behalf by Elizabeth Ashley, a trained medical scribe. The creation of this record is based on the scribe's personal observations and the provider's statements to them. This document has been checked and approved by the attending provider.  I have reviewed the   above documentation for accuracy and completeness, and I agree with the above.  Ancil Linsey, MD

## 2014-05-15 NOTE — Patient Instructions (Addendum)
Chemotherapy Instructions:   What is the most important information I should know about LONSURF?  LONSURF may cause serious side effects, including:  Low blood counts. Low blood counts are common with LONSURF and can sometimes be severe and life-threatening. LONSURF can cause a decrease in your white blood cells, red blood cells, and platelets. Low white blood cells can make you more likely to get serious infections that could lead to death.  Your healthcare provider may: . lower your dose of LONSURF or stop LONSURF if you have low white blood cell or low platelet counts.  Tell your healthcare provider right away if you get any of the following signs and symptoms of infection during treatment with LONSURF: . fever . chills . body aches  What is LONSURF?  LONSURF is a prescription medicine used to treat people with colon or rectal cancer that has spread to other parts of the body and who have been previously treated or cannot receive certain chemotherapy medicines.  How should I take LONSURF?  Marland Kitchen Take LONSURF exactly as your healthcare provider tells you. LONSURF comes in two strengths. Your healthcare provider may prescribe both strengths for your prescribed dose. . Take LONSURF within 1 hour after eating your morning or evening meals. . Your caregiver should wear gloves when handling LONSURF tablets. . If you miss a dose of LONSURF, do not take additional doses to make up for the missed dose. Call your healthcare provider for instructions about what to do for a missed dose. Wendee Copp your hands after handling the LONSURF tablets.   The most common side effects of LONSURF include: . tiredness . vomiting . nausea . abdominal pain . decreased appetite . fever . diarrhea  Tell your healthcare provider if you have nausea, vomiting, or diarrhea that is severe or that does not go away.  How should I store [place]? Marland Kitchen Store LONSURF at room temperature between 40F and 26F. . If you store [place]  outside of the original bottle, throw away (dispose of) any unused LONSURF tablets after 30 days.   Your dosage of Lonsurf:  3 tablets ('60mg'$  total) in the am and 3 tablets ('60mg'$  total) in the pm. Take your morning and evening dose within 1 hour of eating your am and pm meals on Days 1-5 and 8-12 of your 28 day cycle. You will repeat this every 28 days. Refer to your calendar for clarity!!!  Call [place] [facility] with any questions  [phone]  Teaching materials from [employer] website (Patient Information included with this teaching paper)

## 2014-05-18 ENCOUNTER — Other Ambulatory Visit (HOSPITAL_COMMUNITY): Payer: Self-pay | Admitting: Oncology

## 2014-05-18 ENCOUNTER — Encounter (HOSPITAL_COMMUNITY): Payer: Medicare Other

## 2014-05-18 DIAGNOSIS — G4700 Insomnia, unspecified: Secondary | ICD-10-CM

## 2014-05-18 MED ORDER — LORAZEPAM 0.5 MG PO TABS: 1.0000 mg | ORAL_TABLET | Freq: Three times a day (TID) | ORAL | Status: AC | PRN

## 2014-05-19 ENCOUNTER — Other Ambulatory Visit (HOSPITAL_COMMUNITY): Payer: Self-pay | Admitting: Family Medicine

## 2014-05-19 DIAGNOSIS — R202 Paresthesia of skin: Secondary | ICD-10-CM

## 2014-05-19 DIAGNOSIS — R209 Unspecified disturbances of skin sensation: Principal | ICD-10-CM

## 2014-05-19 NOTE — Progress Notes (Signed)
Chemo teaching done over the phone with [facility] regarding [place]. Med calendar will be given to patient after pt receives her medicine. Pt to sign consent for Lonsurf on Friday 05/21/14 when patient comes in to see Angie.

## 2014-05-21 ENCOUNTER — Encounter (HOSPITAL_COMMUNITY): Payer: BLUE CROSS/BLUE SHIELD

## 2014-05-21 NOTE — Progress Notes (Signed)
Chemo consent obtained for Lonsurf.

## 2014-05-22 ENCOUNTER — Ambulatory Visit (HOSPITAL_COMMUNITY)
Admission: RE | Admit: 2014-05-22 | Discharge: 2014-05-22 | Disposition: A | Discharge status: Home / Self Care | Payer: Medicare Other | Source: Ambulatory Visit | Attending: Family Medicine | Admitting: Family Medicine

## 2014-05-22 ENCOUNTER — Telehealth (HOSPITAL_COMMUNITY): Payer: Self-pay | Admitting: Hematology & Oncology

## 2014-05-22 DIAGNOSIS — R209 Unspecified disturbances of skin sensation: Secondary | ICD-10-CM | POA: Insufficient documentation

## 2014-05-22 DIAGNOSIS — C189 Malignant neoplasm of colon, unspecified: Secondary | ICD-10-CM | POA: Diagnosis not present

## 2014-05-22 DIAGNOSIS — R202 Paresthesia of skin: Secondary | ICD-10-CM

## 2014-05-22 MED ORDER — IOHEXOL 300 MG/ML  SOLN
75.0000 mL | Freq: Once | INTRAMUSCULAR | Status: AC | PRN
  Administered 2014-05-22: 75 mL via INTRAVENOUS

## 2014-05-22 NOTE — Telephone Encounter (Signed)
PC TO [facility] TO SEE IF THERE WAS ANYMORE INFO I NEED TO PROVIDE TO THEM. PER [facility] RX WAS SENT TO [facility] AND NO ADDL INFO IS NEEDED FROM [place].  [facility] [phone]

## 2014-05-26 ENCOUNTER — Encounter (HOSPITAL_COMMUNITY): Payer: Self-pay | Admitting: *Deleted

## 2014-05-26 ENCOUNTER — Emergency Department (HOSPITAL_COMMUNITY)
Admission: EM | Admit: 2014-05-26 | Discharge: 2014-05-26 | Disposition: A | Discharge status: Home / Self Care | Payer: Medicare Other | Attending: Emergency Medicine | Admitting: Emergency Medicine

## 2014-05-26 DIAGNOSIS — E079 Disorder of thyroid, unspecified: Secondary | ICD-10-CM | POA: Diagnosis not present

## 2014-05-26 DIAGNOSIS — M549 Dorsalgia, unspecified: Secondary | ICD-10-CM | POA: Insufficient documentation

## 2014-05-26 DIAGNOSIS — Z85038 Personal history of other malignant neoplasm of large intestine: Secondary | ICD-10-CM | POA: Diagnosis not present

## 2014-05-26 DIAGNOSIS — R110 Nausea: Secondary | ICD-10-CM | POA: Insufficient documentation

## 2014-05-26 DIAGNOSIS — I10 Essential (primary) hypertension: Secondary | ICD-10-CM | POA: Diagnosis not present

## 2014-05-26 DIAGNOSIS — Z79899 Other long term (current) drug therapy: Secondary | ICD-10-CM | POA: Insufficient documentation

## 2014-05-26 DIAGNOSIS — E1165 Type 2 diabetes mellitus with hyperglycemia: Secondary | ICD-10-CM | POA: Insufficient documentation

## 2014-05-26 DIAGNOSIS — R1031 Right lower quadrant pain: Secondary | ICD-10-CM | POA: Diagnosis not present

## 2014-05-26 DIAGNOSIS — Z87440 Personal history of urinary (tract) infections: Secondary | ICD-10-CM | POA: Insufficient documentation

## 2014-05-26 DIAGNOSIS — Z8619 Personal history of other infectious and parasitic diseases: Secondary | ICD-10-CM | POA: Insufficient documentation

## 2014-05-26 DIAGNOSIS — Z8505 Personal history of malignant neoplasm of liver: Secondary | ICD-10-CM | POA: Diagnosis not present

## 2014-05-26 DIAGNOSIS — R1030 Lower abdominal pain, unspecified: Secondary | ICD-10-CM | POA: Diagnosis present

## 2014-05-26 DIAGNOSIS — R739 Hyperglycemia, unspecified: Secondary | ICD-10-CM

## 2014-05-26 DIAGNOSIS — Z8709 Personal history of other diseases of the respiratory system: Secondary | ICD-10-CM | POA: Diagnosis not present

## 2014-05-26 LAB — COMPREHENSIVE METABOLIC PANEL
ALT: 20 U/L (ref 14–54)
AST: 21 U/L (ref 15–41)
Albumin: 4.1 g/dL (ref 3.5–5.0)
Alkaline Phosphatase: 48 U/L (ref 38–126)
Anion gap: 9 (ref 5–15)
BILIRUBIN TOTAL: 0.6 mg/dL (ref 0.3–1.2)
BUN: 17 mg/dL (ref 6–20)
CHLORIDE: 97 mmol/L — AB (ref 101–111)
CO2: 27 mmol/L (ref 22–32)
Calcium: 9.2 mg/dL (ref 8.9–10.3)
Creatinine, Ser: 0.75 mg/dL (ref 0.44–1.00)
GFR calc Af Amer: >60 mL/min (ref >60)
GFR calc non Af Amer: >60 mL/min (ref >60)
Glucose, Bld: 320 mg/dL — ABNORMAL HIGH (ref 65–99)
POTASSIUM: 4.2 mmol/L (ref 3.5–5.1)
SODIUM: 133 mmol/L — AB (ref 135–145)
Total Protein: 7.6 g/dL (ref 6.5–8.1)

## 2014-05-26 LAB — URINALYSIS, ROUTINE W REFLEX MICROSCOPIC
Bilirubin Urine: NEGATIVE
Glucose, UA: >1000 mg/dL — AB
Hgb urine dipstick: NEGATIVE
Ketones, ur: 15 mg/dL — AB
Leukocytes, UA: NEGATIVE
NITRITE: NEGATIVE
PH: 5.5 (ref 5.0–8.0)
PROTEIN: NEGATIVE mg/dL
UROBILINOGEN UA: 0.2 mg/dL (ref 0.0–1.0)

## 2014-05-26 LAB — CBC WITH DIFFERENTIAL/PLATELET
Basophils Absolute: 0 10*3/uL (ref 0.0–0.1)
Basophils Relative: 0 % (ref 0–1)
Eosinophils Absolute: 0 10*3/uL (ref 0.0–0.7)
Eosinophils Relative: 0 % (ref 0–5)
HCT: 37.4 % (ref 36.0–46.0)
HEMOGLOBIN: 12.1 g/dL (ref 12.0–15.0)
LYMPHS PCT: 10 % — AB (ref 12–46)
Lymphs Abs: 0.9 10*3/uL (ref 0.7–4.0)
MCH: 32.8 pg (ref 26.0–34.0)
MCHC: 32.4 g/dL (ref 30.0–36.0)
MCV: 101.4 fL — ABNORMAL HIGH (ref 78.0–100.0)
MONO ABS: 0.3 10*3/uL (ref 0.1–1.0)
Monocytes Relative: 3 % (ref 3–12)
NEUTROS PCT: 87 % — AB (ref 43–77)
Neutro Abs: 7.8 10*3/uL — ABNORMAL HIGH (ref 1.7–7.7)
Platelets: 312 10*3/uL (ref 150–400)
RBC: 3.69 MIL/uL — ABNORMAL LOW (ref 3.87–5.11)
RDW: 14 % (ref 11.5–15.5)
WBC: 9 10*3/uL (ref 4.0–10.5)

## 2014-05-26 LAB — URINE MICROSCOPIC-ADD ON

## 2014-05-26 LAB — LIPASE, BLOOD: LIPASE: 18 U/L — AB (ref 22–51)

## 2014-05-26 MED ORDER — ONDANSETRON 8 MG PO TBDP
8.0000 mg | ORAL_TABLET | Freq: Once | ORAL | Status: AC
  Administered 2014-05-26: 8 mg via ORAL
  Filled 2014-05-26: qty 1

## 2014-05-26 MED ORDER — OXYCODONE-ACETAMINOPHEN 5-325 MG PO TABS
2.0000 | ORAL_TABLET | Freq: Once | ORAL | Status: AC
  Administered 2014-05-26: 2 via ORAL
  Filled 2014-05-26: qty 2

## 2014-05-26 MED ORDER — IBUPROFEN 400 MG PO TABS
400.0000 mg | ORAL_TABLET | Freq: Once | ORAL | Status: AC
  Administered 2014-05-26: 400 mg via ORAL
  Filled 2014-05-26: qty 1

## 2014-05-26 NOTE — ED Notes (Signed)
Pt with right flank pain off and on "for awhile", has been seen for same but continues to have pain, pt is a colon CA patient that has spread to liver, denies chemo or radiation at present but to start a new chemo tomorrow

## 2014-05-26 NOTE — Discharge Instructions (Signed)

## 2014-05-26 NOTE — ED Provider Notes (Signed)
CSN: [id number]     Arrival date & time 05/26/14  1920 History  This chart was scribed for Ripley Fraise, MD by Jeanell Sparrow, ED Scribe. This patient was seen in room APA11/APA11 and the patient's care was started at 8:22 PM.   Chief Complaint  Patient presents with  . Flank Pain   Patient is a 74 y.o. female presenting with flank pain. The history is provided by the patient. No language interpreter was used.  Flank Pain This is a new problem. The current episode started 3 to 5 hours ago. The problem occurs rarely. The problem has not changed since onset.Pertinent negatives include no chest pain and no shortness of breath. Nothing aggravates the symptoms. Nothing relieves the symptoms. She has tried nothing for the symptoms.   HPI Comments: [name] is a 74 y.o. female who presents to the Emergency Department complaining of constant moderate right sided flank pain that started about 5 hours ago. She reports that she started having pain to her right side since 5 hours ago. She denies any known recent trauma. She reports that the pain radiates into her back. She reports no modifying factors for the pain. She states that she currently has colon cancer that she will started chemotherapy for soon, not currently on chemo. She reports having some dry cough and nausea also. She states that she has been having normal BMs. She states that she has a hx of abdominal surgery. She denies any fever, vomiting, diarrhea, dysuria, urinary frequency, vaginal bleeding, chest pain, or SOB.   Past Medical History  Diagnosis Date  . Colon cancer     liver ca  . HTN (hypertension)   . Thyroid condition   . Diabetes mellitus   . Shingles   . Acute UTI   . Nausea 02/06/2012  . Port catheter in place 08/15/2012  . Diabetes type 2, controlled 04/13/2012  . Sore throat 02/09/14   Past Surgical History  Procedure Laterality Date  . Colectomy      and bil. oopherectomy  . Appendectomy    . Blt    . Resection  liver partial / total w/ radiofrequency ablation    . Port-a-cath removal      has new port now  . Portacath placement  09/2010  . Oophorectomy Bilateral 2007  . Cataract surgery Right 01/10/13  . Cataract surgery Left 01/24/13   Family History  Problem Relation Age of Onset  . Diabetes Mother   . Hypertension Mother   . Hypertension Father   . Cancer Son    History  Substance Use Topics  . Smoking status: Never Smoker   . Smokeless tobacco: Never Used  . Alcohol Use: No   OB History    No data available     Review of Systems  Constitutional: Negative for fever.  Respiratory: Positive for cough. Negative for shortness of breath.   Cardiovascular: Negative for chest pain.  Gastrointestinal: Positive for nausea. Negative for vomiting and diarrhea.  Genitourinary: Positive for flank pain. Negative for dysuria, frequency and vaginal bleeding.  Musculoskeletal: Positive for back pain.  All other systems reviewed and are negative.   Allergies  Review of patient's allergies indicates no known allergies.  Home Medications   Prior to Admission medications   Medication Sig Start Date End Date Taking? Authorizing Provider  cyclobenzaprine (FLEXERIL) 10 MG tablet Take 1 tablet (10 mg total) by mouth 3 (three) times daily as needed for muscle spasms. 02/12/14   Manon Hilding  Kefalas, PA-C  dorzolamide-timolol (COSOPT) 22.3-6.8 MG/ML ophthalmic solution Place 1 drop into both eyes 2 (two) times daily.     Historical Provider, MD  ibuprofen (ADVIL,MOTRIN) 200 MG tablet Take 200 mg by mouth every 6 (six) hours as needed for mild pain.     Historical Provider, MD  levothyroxine (SYNTHROID, LEVOTHROID) 75 MCG tablet Take 75 mcg by mouth daily before breakfast.    Historical Provider, MD  LORazepam (ATIVAN) 0.5 MG tablet Take 2 tablets (1 mg total) by mouth every 8 (eight) hours as needed for anxiety or sleep. 05/18/14   Baird Cancer, PA-C  metFORMIN (GLUCOPHAGE) 500 MG tablet Take 500 mg by  mouth 2 (two) times daily with a meal.    Historical Provider, MD  methocarbamol (ROBAXIN) 500 MG tablet Take 3 tablets three times daily as needed for muscle spasm 05/11/14   Patrici Ranks, MD  ondansetron (ZOFRAN) 8 MG tablet Take 1 tablet (8 mg total) by mouth every 8 (eight) hours as needed for nausea or vomiting. 01/15/14   Baird Cancer, PA-C  oxyCODONE-acetaminophen (PERCOCET) 5-325 MG per tablet Take 1-2 tablets by mouth every 4 (four) hours as needed. 04/04/14   Nat Christen, MD  regorafenib (STIVARGA) 40 MG tablet Take 80 mg with low fat meal, 21 days on and 7 day respite. Caution: Chemotherapy. 05/07/14   Baird Cancer, PA-C  trifluridine-tipiracil (LONSURF) 20-8.19 MG tablet Take 3 tablets (60 mg of trifluridine total) by mouth 2 (two) times daily after a meal. 1 hr after AM & PM meals on days 1-5, 8-12. Repeat every 28day 05/11/14   Patrici Ranks, MD   BP 164/70 mmHg  Pulse 62  Temp(Src) 97.6 F (36.4 C) (Oral)  Resp 18  Ht '5\' 5"'$  (1.651 m)  Wt 150 lb (68.04 kg)  BMI 24.96 kg/m2  SpO2 93% Physical Exam  Nursing note and vitals reviewed. CONSTITUTIONAL: Well developed/well nourished HEAD: Normocephalic/atraumatic EYES: EOMI/PERRL ENMT: Mucous membranes moist NECK: supple no meningeal signs SPINE/BACK:entire spine nontender CV: S1/S2 noted, no murmurs/rubs/gallops noted LUNGS: Lungs are clear to auscultation bilaterally, no apparent distress ABDOMEN: soft, mild RLQ TTP, no rebound or guarding, bowel sounds noted throughout abdomen GU:no cva tenderness NEURO: Pt is awake/alert/appropriate, moves all extremitiesx4.  No facial droop.   EXTREMITIES: pulses normal/equal, full ROM SKIN: warm, color normal PSYCH: no abnormalities of mood noted, alert and oriented to situation  ED Course  Procedures  DIAGNOSTIC STUDIES: Oxygen Saturation is 93% on RA, normal by my interpretation.    COORDINATION OF CARE: 8:26 PM- Pt advised of plan for treatment which includes medication  and labs and pt agrees.   Pt very well appearing She still has some mild tenderness to palpation Labs at baseline She has h/o appendectomy and oophrectomy She is clinically nontoxic She has had multiple recent CT abd/pelvis scans I don't feel repeat imaging required at this time Pt is agreeable We discussed strict return precautions  Labs Review Labs Reviewed  COMPREHENSIVE METABOLIC PANEL - Abnormal; Notable for the following:    Sodium 133 (*)    Chloride 97 (*)    Glucose, Bld 320 (*)    All other components within normal limits  CBC WITH DIFFERENTIAL/PLATELET - Abnormal; Notable for the following:    RBC 3.69 (*)    MCV 101.4 (*)    Neutrophils Relative % 87 (*)    Neutro Abs 7.8 (*)    Lymphocytes Relative 10 (*)    All other components within  normal limits  LIPASE, BLOOD - Abnormal; Notable for the following:    Lipase 18 (*)    All other components within normal limits  URINALYSIS, ROUTINE W REFLEX MICROSCOPIC - Abnormal; Notable for the following:    Specific Gravity, Urine >1.030 (*)    Glucose, UA >1000 (*)    Ketones, ur 15 (*)    All other components within normal limits  URINE MICROSCOPIC-ADD ON - Abnormal; Notable for the following:    Squamous Epithelial / LPF FEW (*)    All other components within normal limits   Medications  oxyCODONE-acetaminophen (PERCOCET/ROXICET) 5-325 MG per tablet 2 tablet (2 tablets Oral Given 05/26/14 2035)  ondansetron (ZOFRAN-ODT) disintegrating tablet 8 mg (8 mg Oral Given 05/26/14 2129)  ibuprofen (ADVIL,MOTRIN) tablet 400 mg (400 mg Oral Given 05/26/14 2232)     MDM   Final diagnoses:  RLQ abdominal pain  Hyperglycemia    Nursing notes including past medical history and social history reviewed and considered in documentation Labs/vital reviewed myself and considered during evaluation   I personally performed the services described in this documentation, which was scribed in my presence. The recorded information has  been reviewed and is accurate.       Ripley Fraise, MD 05/26/14 [phone]

## 2014-06-02 ENCOUNTER — Encounter (HOSPITAL_COMMUNITY): Payer: Medicare Other

## 2014-06-02 ENCOUNTER — Emergency Department (HOSPITAL_COMMUNITY)
Admission: EM | Admit: 2014-06-02 | Discharge: 2014-06-02 | Disposition: A | Discharge status: Home / Self Care | Payer: Medicare Other | Attending: Emergency Medicine | Admitting: Emergency Medicine

## 2014-06-02 ENCOUNTER — Encounter (HOSPITAL_COMMUNITY): Payer: Self-pay | Admitting: *Deleted

## 2014-06-02 DIAGNOSIS — E119 Type 2 diabetes mellitus without complications: Secondary | ICD-10-CM | POA: Diagnosis not present

## 2014-06-02 DIAGNOSIS — Z87440 Personal history of urinary (tract) infections: Secondary | ICD-10-CM | POA: Diagnosis not present

## 2014-06-02 DIAGNOSIS — Z85038 Personal history of other malignant neoplasm of large intestine: Secondary | ICD-10-CM | POA: Diagnosis not present

## 2014-06-02 DIAGNOSIS — Z8619 Personal history of other infectious and parasitic diseases: Secondary | ICD-10-CM | POA: Insufficient documentation

## 2014-06-02 DIAGNOSIS — C229 Malignant neoplasm of liver, not specified as primary or secondary: Secondary | ICD-10-CM | POA: Insufficient documentation

## 2014-06-02 DIAGNOSIS — Z7982 Long term (current) use of aspirin: Secondary | ICD-10-CM | POA: Diagnosis not present

## 2014-06-02 DIAGNOSIS — E079 Disorder of thyroid, unspecified: Secondary | ICD-10-CM | POA: Diagnosis not present

## 2014-06-02 DIAGNOSIS — I10 Essential (primary) hypertension: Secondary | ICD-10-CM | POA: Diagnosis not present

## 2014-06-02 DIAGNOSIS — R1031 Right lower quadrant pain: Secondary | ICD-10-CM | POA: Insufficient documentation

## 2014-06-02 DIAGNOSIS — Z79899 Other long term (current) drug therapy: Secondary | ICD-10-CM | POA: Insufficient documentation

## 2014-06-02 DIAGNOSIS — C3490 Malignant neoplasm of unspecified part of unspecified bronchus or lung: Secondary | ICD-10-CM | POA: Diagnosis not present

## 2014-06-02 DIAGNOSIS — R109 Unspecified abdominal pain: Secondary | ICD-10-CM | POA: Diagnosis present

## 2014-06-02 MED ORDER — TRAMADOL HCL 50 MG PO TABS
50.0000 mg | ORAL_TABLET | Freq: Once | ORAL | Status: AC
  Administered 2014-06-02: 50 mg via ORAL
  Filled 2014-06-02: qty 1

## 2014-06-02 MED ORDER — TRAMADOL HCL 50 MG PO TABS: 50.0000 mg | ORAL_TABLET | Freq: Four times a day (QID) | ORAL | Status: AC | PRN

## 2014-06-02 NOTE — ED Provider Notes (Signed)
CSN: [id number]     Arrival date & time 06/02/14  1923 History   First MD Initiated Contact with Patient 06/02/14 1943     Chief Complaint  Patient presents with  . Flank Pain     (Consider location/radiation/quality/duration/timing/severity/associated sxs/prior Treatment) Patient is a 74 y.o. female presenting with flank pain. The history is provided by the patient.  Flank Pain Associated symptoms include abdominal pain. Pertinent negatives include no chest pain, no headaches and no shortness of breath.   patient with complaint of right-sided abdominal pain on and off since March. 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 Followed by hematology oncology here. Does have follow-up tomorrow. Patient did have a CT scan of the abdomen and pelvis at the beginning of May without any S acute findings. Patient also seen in the emergency department for this pain on May 24 without significant lab abnormalities.  Past Medical History  Diagnosis Date  . Colon cancer     liver ca  . HTN (hypertension)   . Thyroid condition   . Diabetes mellitus   . Shingles   . Acute UTI   . Nausea 02/06/2012  . Port catheter in place 08/15/2012  . Diabetes type 2, controlled 04/13/2012  . Sore throat 02/09/14   Past Surgical History  Procedure Laterality Date  . Colectomy      and bil. oopherectomy  . Appendectomy    . Blt    . Resection liver partial / total w/ radiofrequency ablation    . Port-a-cath removal      has new port now  . Portacath placement  09/2010  . Oophorectomy Bilateral 2007  . Cataract surgery Right 01/10/13  . Cataract surgery Left 01/24/13   Family History  Problem Relation Age of Onset  . Diabetes Mother   . Hypertension Mother   . Hypertension Father   . Cancer Son    History  Substance Use Topics  . Smoking status: Never Smoker   . Smokeless tobacco: Never Used  . Alcohol Use: No   OB History    No data available     Review of Systems   Constitutional: Negative for fever.  HENT: Negative for congestion.   Eyes: Negative for visual disturbance.  Respiratory: Negative for shortness of breath.   Cardiovascular: Negative for chest pain.  Gastrointestinal: Positive for abdominal pain. Negative for nausea and vomiting.  Genitourinary: Positive for flank pain. Negative for dysuria.  Musculoskeletal: Negative for back pain.  Skin: Negative for rash.  Allergic/Immunologic: Positive for immunocompromised state.  Neurological: Negative for headaches.  Hematological: Does not bruise/bleed easily.  Psychiatric/Behavioral: Negative for confusion.      Allergies  Review of patient's allergies indicates no known allergies.  Home Medications   Prior to Admission medications   Medication Sig Start Date End Date Taking? Authorizing Provider  amLODipine (NORVASC) 5 MG tablet Take 5 mg by mouth daily.   Yes Historical Provider, MD  dorzolamide-timolol (COSOPT) 22.3-6.8 MG/ML ophthalmic solution Place 1 drop into both eyes 2 (two) times daily.    Yes Historical Provider, MD  glipiZIDE (GLUCOTROL XL) 5 MG 24 hr tablet Take 5 mg by mouth daily. 05/26/14  Yes Historical Provider, MD  ibuprofen (ADVIL,MOTRIN) 200 MG tablet Take 200 mg by mouth every 6 (six) hours as needed for mild pain.    Yes Historical Provider, MD  levothyroxine (SYNTHROID, LEVOTHROID) 75 MCG tablet Take 75 mcg by mouth daily before breakfast.   Yes Historical Provider,  MD  LORazepam (ATIVAN) 0.5 MG tablet Take 2 tablets (1 mg total) by mouth every 8 (eight) hours as needed for anxiety or sleep. 05/18/14  Yes Baird Cancer, PA-C  metFORMIN (GLUCOPHAGE) 500 MG tablet Take 500 mg by mouth 2 (two) times daily with a meal.   Yes Historical Provider, MD  ondansetron (ZOFRAN) 8 MG tablet Take 1 tablet (8 mg total) by mouth every 8 (eight) hours as needed for nausea or vomiting. 01/15/14  Yes Manon Hilding Kefalas, PA-C  trifluridine-tipiracil (LONSURF) 20-8.19 MG tablet Take 3  tablets (60 mg of trifluridine total) by mouth 2 (two) times daily after a meal. 1 hr after AM & PM meals on days 1-5, 8-12. Repeat every 28day 05/11/14  Yes Patrici Ranks, MD  cyclobenzaprine (FLEXERIL) 10 MG tablet Take 1 tablet (10 mg total) by mouth 3 (three) times daily as needed for muscle spasms. Patient not taking: Reported on 05/26/2014 02/12/14   Baird Cancer, PA-C  methocarbamol (ROBAXIN) 500 MG tablet Take 3 tablets three times daily as needed for muscle spasm Patient not taking: Reported on 05/26/2014 05/11/14   Patrici Ranks, MD  oxyCODONE-acetaminophen (PERCOCET) 5-325 MG per tablet Take 1-2 tablets by mouth every 4 (four) hours as needed. Patient not taking: Reported on 05/26/2014 04/04/14   Nat Christen, MD  predniSONE (DELTASONE) 5 MG tablet Take 5 mg by mouth daily. 10 day course 05/26/14   Historical Provider, MD  regorafenib (STIVARGA) 40 MG tablet Take 80 mg with low fat meal, 21 days on and 7 day respite. Caution: Chemotherapy. Patient not taking: Reported on 05/26/2014 05/07/14   Manon Hilding Kefalas, PA-C  traMADol (ULTRAM) 50 MG tablet Take 1 tablet (50 mg total) by mouth every 6 (six) hours as needed. 06/02/14   Fredia Sorrow, MD   BP 159/93 mmHg  Pulse 88  Temp(Src) 98.4 F (36.9 C) (Oral)  Resp 20  Ht '5\' 5"'$  (1.651 m)  Wt 159 lb (72.122 kg)  BMI 26.46 kg/m2  SpO2 100% Physical Exam  Constitutional: She is oriented to person, place, and time. She appears well-developed and well-nourished. No distress.  HENT:  Head: Normocephalic and atraumatic.  Mouth/Throat: Oropharynx is clear and moist.  Eyes: Conjunctivae and EOM are normal. Pupils are equal, round, and reactive to light.  Neck: Normal range of motion.  Cardiovascular: Normal rate and regular rhythm.   No murmur heard. Pulmonary/Chest: Effort normal and breath sounds normal.  Abdominal: Soft. Bowel sounds are normal. There is tenderness.  Mild tenderness right side of the abdomen.  Musculoskeletal: Normal  range of motion.  Neurological: She is alert and oriented to person, place, and time. No cranial nerve deficit. She exhibits normal muscle tone. Coordination normal.  Skin: Skin is warm. No rash noted.  Nursing note and vitals reviewed.   ED Course  Procedures (including critical care time) Labs Review Labs Reviewed - No data to display  Imaging Review No results found.   EKG Interpretation None      MDM   Final diagnoses:  Right lower quadrant abdominal pain    Patient with intermittent right-sided abdominal pain since March. Patient has a history of metastatic colon cancer. Followed by hematology oncology here. Patient had a CAT scan of the beginning of May without significant findings. Patient's known to have liver metastases. Patient's had labs on May 24 without significant abnormalities. This is somewhat of a chronic type pain. Patient has follow-up with hematology oncology clinic tomorrow. Abdomen is nonacute. Patient  without any significant vital sign abnormalities. Most likely a chronic abdominal pain type picture.. Well could be related to the colon cancer. Recommend follow-up with hematology oncology clinic for further discussion and determining what additional value and ablation needs to be done. Patient treated with tramadol here. We'll continue some tramadol at home. Patient will return for any new or worse symptoms.    Fredia Sorrow, MD 06/02/14 [phone]

## 2014-06-02 NOTE — ED Notes (Signed)
Pt c/o right flank pain x 1 month; pt states she was in here for same last week and nothing was found to be wrong; pt denies any urinary problems, pt states the pain makes her nauseous; pt states she is a cancer pt and is taking chemo

## 2014-06-02 NOTE — ED Notes (Signed)
Discharge instructions given, pt demonstrated teach back and verbal understanding. No concerns voiced.  

## 2014-06-02 NOTE — Discharge Instructions (Signed)
Follow-up with Dr. Whitney Muse as scheduled for tomorrow. This seems to be chronic abdominal pain. CT scan beginning of the month without any significant findings. Labs from just a few days ago without any significant abnormalities. Take the tramadol as directed.Marland Kitchen

## 2014-06-03 ENCOUNTER — Encounter (HOSPITAL_COMMUNITY): Payer: Self-pay | Admitting: Hematology & Oncology

## 2014-06-03 ENCOUNTER — Encounter (HOSPITAL_COMMUNITY): Payer: Medicare Other | Attending: Internal Medicine | Admitting: Hematology & Oncology

## 2014-06-03 VITALS — BP 152/66 | HR 76 | Temp 97.7°F | Resp 20 | Wt 151.6 lb

## 2014-06-03 DIAGNOSIS — C787 Secondary malignant neoplasm of liver and intrahepatic bile duct: Secondary | ICD-10-CM

## 2014-06-03 DIAGNOSIS — M542 Cervicalgia: Secondary | ICD-10-CM

## 2014-06-03 DIAGNOSIS — C189 Malignant neoplasm of colon, unspecified: Secondary | ICD-10-CM

## 2014-06-03 DIAGNOSIS — C19 Malignant neoplasm of rectosigmoid junction: Secondary | ICD-10-CM | POA: Diagnosis not present

## 2014-06-03 DIAGNOSIS — Z9889 Other specified postprocedural states: Secondary | ICD-10-CM | POA: Insufficient documentation

## 2014-06-03 MED ORDER — HYDROCODONE-ACETAMINOPHEN 5-325 MG PO TABS: ORAL_TABLET | ORAL | Status: DC

## 2014-06-03 NOTE — Patient Instructions (Signed)
[facility] Discharge Instructions  RECOMMENDATIONS MADE BY THE CONSULTANT AND ANY TEST RESULTS WILL BE SENT TO YOUR REFERRING PHYSICIAN.  Exam and discussion by Dr. Whitney Muse. Continue with therapy. Call with any questions or concerns.   Follow-up when you come for your port flush.  Thank you for choosing [place] at [facility] to provide your oncology and hematology care.  To afford each patient quality time with our provider, please arrive at least 15 minutes before your scheduled appointment time.    You need to re-schedule your appointment should you arrive 10 or more minutes late.  We strive to give you quality time with our providers, and arriving late affects you and other patients whose appointments are after yours.  Also, if you no show three or more times for appointments you may be dismissed from the clinic at the providers discretion.     Again, thank you for choosing [facility].  Our hope is that these requests will decrease the amount of time that you wait before being seen by our physicians.       _____________________________________________________________  Should you have questions after your visit to [facility], please contact our office at [phone] between the hours of 8:30 a.m. and 4:30 p.m.  Voicemails left after 4:30 p.m. will not be returned until the following business day.  For prescription refill requests, have your pharmacy contact our office.

## 2014-06-03 NOTE — Progress Notes (Signed)
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 That was completed on 04/03/2011 KRAS mutated FOLFIRI + Avastin from 01/16/2012- 04/08/2012 at which time her quality of life was declining and she requested a break from therapy.  Stivarga started 11/20/2013 discontinued 04/2014 Lonsurf  CURRENT THERAPY: [name]  INTERVAL HISTORY: [name] 74 y.o. 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 She is status post RFA ablation initially of single liver lesion in August 2012 followed by 6 cycles of FOLFOX/bevacizumab. That was completed on 04/03/2011. She then had a rising CEA level with recurrent disease in the liver and probably in the lung with small non-hypermetabolic pulmonary nodules. Therefore, she was started on FOLFIRI + Avastin from 01/16/2012- 04/08/2012 at which time her quality of life was declining and she requested a break from therapy. She was eventually started on Stivarga which she tolerated well for approximately 6 months. She has now been changed to [place] secondary to progression.  She has only taken 2 days of her new medication.  She has been experiencing daily neck pain in the top right shoulder/neck. Her current pain medication, She has been given Flexeril with no relief. She is unsure where the pain eminates from. The pain starts in the deep tissue of the right neck and sometimes she is unable to move her right arm and shoulder. Sometimes the pain begins when she turns her head, she mentions that she has to be careful turning her head while driving. Putting strain on her neck to lie back causes pain. Dr. Edrick Oh recently gave her prednisone to try to help. She does not think it improved her pain but significantly elevated  her blood sugars.  She has been seen in the emergency room twice in the last 2 weeks for right flank pain, but she notes it is her neck pain that radiates into the flank.      Colon adenocarcinoma   11/04/2013 Progression CT CAP and head- Interval progression of disease within the chest, abdomen. Progression of pulmonary metastasis. Increase in size of right hepatic lobe metastasis. Progression of retroperitoneal adenopathy and peritoneal adenopathy  metastasis   11/20/2013 -  Chemotherapy Stivarga 80 mg daily 21 days on and 7 days off    Past Medical History  Diagnosis Date  . Colon cancer     liver ca  . HTN (hypertension)   . Thyroid condition   . Diabetes mellitus   . Shingles   . Acute UTI   . Nausea 02/06/2012  . Port catheter in place 08/15/2012  . Diabetes type 2, controlled 04/13/2012  . Sore throat 02/09/14    has Colon adenocarcinoma; Thyroid condition; Diabetes type 2, controlled; HTN (hypertension), benign; Port catheter in place; Dry mouth; Nausea without vomiting; Trapezius muscle strain; and Reactive depression (situational) on her problem list.     has No Known Allergies.  Ms. Tilmon had no medications administered during this visit.  Past Surgical History  Procedure Laterality Date  . Colectomy      and bil. oopherectomy  . Appendectomy    . Blt    . Resection liver partial / total w/ radiofrequency ablation    . Port-a-cath removal      has new port now  . Portacath placement  09/2010  . Oophorectomy Bilateral 2007  . Cataract surgery Right 01/10/13  . Cataract surgery Left 01/24/13    Denies any headaches, dizziness, double vision, fevers, chills, night sweats, nausea, vomiting, diarrhea, constipation, chest pain, heart palpitations, shortness of breath, blood in stool, black tarry stool, urinary pain, urinary burning, urinary frequency, hematuria. Denies abdominal pain. Positive for neck pain.       Right neck. Experiences this pain daily.   PHYSICAL  EXAMINATION   ECOG PERFORMANCE STATUS: 1 - Symptomatic but completely ambulatory  Filed Vitals:   06/03/14 1000  BP: 152/66  Pulse: 76  Temp: 97.7 F (36.5 C)  Resp: 20    GENERAL:alert, no distress, well nourished, well developed, comfortable, cooperative and smiling SKIN: skin color, texture, turgor are normal, no rashes or significant lesions HEAD: Normocephalic, No masses, lesions, tenderness or abnormalities EYES: normal, PERRLA, EOMI, Conjunctiva are pink and non-injected EARS: External ears normal OROPHARYNX:lips, buccal mucosa, and tongue normal and mucous membranes are moist  NECK: supple, no adenopathy, thyroid normal size, non-tender, without nodularity, no stridor, non-tender, trachea midline     Right neck tender on palpation.  LYMPH:  no palpable lymphadenopathy BREAST:not examined LUNGS: clear to auscultation and percussion HEART: regular rate & rhythm, no murmurs, no gallops, S1 normal and S2 normal ABDOMEN:abdomen soft, non-tender and normal bowel sounds BACK: Back symmetric, no curvature., No CVA tenderness EXTREMITIES:less then 2 second capillary refill, no joint deformities, effusion, or inflammation, no edema, no skin discoloration, no clubbing, no cyanosis  NEURO: alert & oriented x 3 with fluent speech, no focal motor/sensory deficits, gait normal   LABORATORY DATA: CBC    Component Value Date/Time   WBC 9.0 05/26/2014 2016   RBC 3.69* 05/26/2014 2016   HGB 12.1 05/26/2014 2016   HCT 37.4 05/26/2014 2016   PLT 312 05/26/2014 2016   MCV 101.4* 05/26/2014 2016   MCH 32.8 05/26/2014 2016   MCHC 32.4 05/26/2014 2016   RDW 14.0 05/26/2014 2016   LYMPHSABS 0.9 05/26/2014 2016   MONOABS 0.3 05/26/2014 2016   EOSABS 0.0 05/26/2014 2016   BASOSABS 0.0 05/26/2014 2016      Chemistry      Component Value Date/Time   NA 133* 05/26/2014 2016   K 4.2 05/26/2014 2016   CL 97* 05/26/2014 2016   CO2 27 05/26/2014 2016   BUN 17 05/26/2014 2016    CREATININE 0.75 05/26/2014 2016      Component Value Date/Time   CALCIUM 9.2 05/26/2014 2016   ALKPHOS 48 05/26/2014 2016   AST 21 05/26/2014 2016   ALT 20 05/26/2014 2016   BILITOT 0.6 05/26/2014 2016      Lab Results  Component Value Date   CEA 249.8* 05/07/2014   RADIOLOGY:  CLINICAL DATA: Metastatic colorectal adenocarcinoma, stage IV. Restaging.  EXAM: CT CHEST, ABDOMEN, AND PELVIS WITH CONTRAST  TECHNIQUE: Multidetector CT imaging of the chest, abdomen and pelvis was performed following the standard protocol during bolus administration of intravenous contrast.  CONTRAST: 160m OMNIPAQUE IOHEXOL 300 MG/ML SOLN  COMPARISON: 03/09/2014  FINDINGS: CT CHEST FINDINGS  Mediastinum/Nodes: Coronary atherosclerosis. Ascending aortic aneurysm, 4.3 cm diameter. Small to moderate-sized type 1 hiatal hernia.  Lungs/Pleura: Right upper lobe nodule 1.1 by 0.9 cm on image 14 series 6, formerly 1.2 by 1.1 cm by my measurements.  Cavitary posterior right upper lobe nodule 1.4 by 1.1 cm, formerly the same by my measurements. Left upper lobe cavitary nodule 1.8 by 1.6 cm, formerly 1.7 by 1.6 cm  by my measurements.  Other scattered nodules, most of which are cavitary, are stable.  Musculoskeletal: Unremarkable  CT ABDOMEN PELVIS FINDINGS  Hepatobiliary: The dominant right hepatic lobe mass measures 5.5 by 5.0 cm on image 41 of series 2, previously 5.5 by 4.7 cm when measured in the same fashion. New hypodense 8 mm nodule or mass in the dome of the liver on image 40 of series 2. New 0.9 by 0.7 cm hypodense lesion in segment 7 of the liver, image 44 series 2. Suspected 4 mm lesion in the lateral segment left hepatic lobe, image 48 series 2, essentially stable. Fatty infiltration adjacent to the falciform ligament.  Pancreas: Unremarkable  Spleen: Unremarkable  Adrenals/Urinary Tract: Stable fullness of the right adrenal gland. Otherwise  unremarkable.  Stomach/Bowel: Mild wall thickening in the rectum.  Vascular/Lymphatic: Pathologic retrocaval lymph node 1.8 cm in short axis on image 60 of series 2, formerly the same when measured in the same fashion. 0.8 cm lymph node between the right hemidiaphragmatic crus and the IVC on image 54 of series 2, formerly 0.6 cm.  Aortoiliac atherosclerotic vascular disease.  Reproductive: Unremarkable  Other: Right omental nodule 1.8 by 1.0 cm on image 81 series 2, formerly 1.6 by 1.0 by my measurements. This is oriented slightly differently than on the prior exam which might account for some measurement variation.  Presacral edema and perirectal stranding, as before, might be therapy related.  Musculoskeletal: Spondylosis and degenerative disc disease at the L4-5 and L5-S1 levels may be causing impingement. Calcified nodularity along a laparotomy scar, image 92 series 2, stable and probably incidental.  IMPRESSION: 1. The dominant right hepatic lobe mass is mildly increased in size, and there several new hypodense lesions in the liver which probably represent additional metastatic foci. 2. Otherwise essentially stable metastatic disease to the lungs, retroperitoneum, and right omentum. 3. Perirectal and presacral stranding may be therapy related. 4. Potential mild impingement at L4-5 and L5-S1 due to spondylosis and degenerative disc disease. 5. Considerable atherosclerosis. 4.3 cm in diameter ascending aortic aneurysm. This can be monitored using followup CT of the chest from restaging purposes; in the absence of restaging CTs, guidelines recommend annual imaging followup by CTA or MRA. This recommendation follows 2010 ACCF/AHA/AATS/ACR/ASA/SCA/SCAI/SIR/STS/SVM Guidelines for the Diagnosis and Management of Patients with Thoracic Aortic Disease. Circulation. 2010; 121: V784-O962  Electronically Signed  By: Van Clines M.D.  On: 05/07/2014  10:55 CLINICAL DATA: Metastatic colorectal adenocarcinoma, stage IV. Restaging.    ASSESSMENT AND PLAN:  Stage IV colorectal cancer with liver metastases Previous treatment with FOLFOX and Avastin, FOLFIRI K-ras mutated R sided neck pain  She will continue with her new medication, Lonsurf. She is only taken it for 2 days. She denies any accompanying nausea or abdominal discomfort. She would like to return to the clinic for her next port flush and be seen the same day, that is about 2 weeks away. I would prefer to have labs sooner but one of the patient's issues with therapy is frequent visits. We will therefore schedule her for return in 2 weeks with labs, port flush, physical exam. She is advised to call with any difficulties or problems.  I am not sure of the cause of her neck pain it could be potential osteoarthritis and we discussed this today. I have given her a prescription for hydrocodone to take as needed. We discussed imaging studies of the neck and she is open to this if her pain returns to the severity of the last two  weeks.  All questions were answered. The patient knows to call the clinic with any problems, questions or concerns. We can certainly see the patient much sooner if necessary. Note was electronically signed  This document serves as a record of services personally performed by Ancil Linsey, MD. It was created on her behalf by Arlyce Harman, a trained medical scribe. The creation of this record is based on the scribe's personal observations and the provider's statements to them. This document has been checked and approved by the attending provider.  I have reviewed the above documentation for accuracy and completeness, and I agree with the above.  Ancil Linsey, MD

## 2014-06-19 ENCOUNTER — Ambulatory Visit (HOSPITAL_COMMUNITY): Payer: Medicare Other | Admitting: Hematology & Oncology

## 2014-06-19 ENCOUNTER — Encounter (HOSPITAL_BASED_OUTPATIENT_CLINIC_OR_DEPARTMENT_OTHER): Payer: Medicare Other | Admitting: Hematology & Oncology

## 2014-06-19 ENCOUNTER — Encounter (HOSPITAL_COMMUNITY): Payer: Medicare Other

## 2014-06-19 VITALS — BP 152/78 | HR 86 | Temp 98.2°F | Resp 16 | Wt 151.8 lb

## 2014-06-19 DIAGNOSIS — Z9889 Other specified postprocedural states: Secondary | ICD-10-CM | POA: Diagnosis present

## 2014-06-19 DIAGNOSIS — C189 Malignant neoplasm of colon, unspecified: Secondary | ICD-10-CM

## 2014-06-19 DIAGNOSIS — C19 Malignant neoplasm of rectosigmoid junction: Secondary | ICD-10-CM

## 2014-06-19 DIAGNOSIS — M542 Cervicalgia: Secondary | ICD-10-CM | POA: Diagnosis not present

## 2014-06-19 DIAGNOSIS — E119 Type 2 diabetes mellitus without complications: Secondary | ICD-10-CM | POA: Diagnosis not present

## 2014-06-19 DIAGNOSIS — G8929 Other chronic pain: Secondary | ICD-10-CM

## 2014-06-19 DIAGNOSIS — R110 Nausea: Secondary | ICD-10-CM

## 2014-06-19 DIAGNOSIS — C787 Secondary malignant neoplasm of liver and intrahepatic bile duct: Secondary | ICD-10-CM | POA: Diagnosis not present

## 2014-06-19 LAB — CBC WITH DIFFERENTIAL/PLATELET
BASOS ABS: 0 10*3/uL (ref 0.0–0.1)
Basophils Relative: 0 % (ref 0–1)
EOS PCT: 3 % (ref 0–5)
Eosinophils Absolute: 0.1 10*3/uL (ref 0.0–0.7)
HEMATOCRIT: 32.7 % — AB (ref 36.0–46.0)
Hemoglobin: 10.8 g/dL — ABNORMAL LOW (ref 12.0–15.0)
LYMPHS PCT: 39 % (ref 12–46)
Lymphs Abs: 1 10*3/uL (ref 0.7–4.0)
MCH: 33.4 pg (ref 26.0–34.0)
MCHC: 33 g/dL (ref 30.0–36.0)
MCV: 101.2 fL — AB (ref 78.0–100.0)
Monocytes Absolute: 0.2 10*3/uL (ref 0.1–1.0)
Monocytes Relative: 7 % (ref 3–12)
Neutro Abs: 1.2 10*3/uL — ABNORMAL LOW (ref 1.7–7.7)
Neutrophils Relative %: 51 % (ref 43–77)
PLATELETS: 274 10*3/uL (ref 150–400)
RBC: 3.23 MIL/uL — ABNORMAL LOW (ref 3.87–5.11)
RDW: 13.7 % (ref 11.5–15.5)
WBC: 2.4 10*3/uL — AB (ref 4.0–10.5)

## 2014-06-19 LAB — COMPREHENSIVE METABOLIC PANEL
ALK PHOS: 49 U/L (ref 38–126)
ALT: 17 U/L (ref 14–54)
AST: 22 U/L (ref 15–41)
Albumin: 3.9 g/dL (ref 3.5–5.0)
Anion gap: 10 (ref 5–15)
BUN: 13 mg/dL (ref 6–20)
CO2: 26 mmol/L (ref 22–32)
CREATININE: 0.51 mg/dL (ref 0.44–1.00)
Calcium: 9 mg/dL (ref 8.9–10.3)
Chloride: 101 mmol/L (ref 101–111)
GFR calc Af Amer: >60 mL/min (ref >60)
GFR calc non Af Amer: >60 mL/min (ref >60)
Glucose, Bld: 160 mg/dL — ABNORMAL HIGH (ref 65–99)
Potassium: 3.9 mmol/L (ref 3.5–5.1)
Sodium: 137 mmol/L (ref 135–145)
Total Bilirubin: 0.7 mg/dL (ref 0.3–1.2)
Total Protein: 7.1 g/dL (ref 6.5–8.1)

## 2014-06-19 MED ORDER — OMEPRAZOLE 40 MG PO CPDR: 40.0000 mg | DELAYED_RELEASE_CAPSULE | Freq: Every day | ORAL | Status: AC

## 2014-06-19 MED ORDER — SODIUM CHLORIDE 0.9 % IJ SOLN
10.0000 mL | INTRAMUSCULAR | Status: DC | PRN
  Administered 2014-06-19: 10 mL via INTRAVENOUS
  Filled 2014-06-19: qty 10

## 2014-06-19 MED ORDER — HEPARIN SOD (PORK) LOCK FLUSH 100 UNIT/ML IV SOLN
500.0000 [IU] | Freq: Once | INTRAVENOUS | Status: AC
  Administered 2014-06-19: 500 [IU] via INTRAVENOUS
  Filled 2014-06-19: qty 5

## 2014-06-19 NOTE — Progress Notes (Signed)
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 That was completed on 04/03/2011 KRAS mutated FOLFIRI + Avastin from 01/16/2012- 04/08/2012 at which time her quality of life was declining and she requested a break from therapy.  Stivarga started 11/20/2013 discontinued 04/2014 Lonsurf  CURRENT THERAPY: [name]  INTERVAL HISTORY: [name] 74 y.o. 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 She is status post RFA ablation initially of single liver lesion in August 2012 followed by 6 cycles of FOLFOX/bevacizumab. That was completed on 04/03/2011. She then had a rising CEA level with recurrent disease in the liver and probably in the lung with small non-hypermetabolic pulmonary nodules. Therefore, she was started on FOLFIRI + Avastin from 01/16/2012- 04/08/2012 at which time her quality of life was declining and she requested a break from therapy. She was eventually started on Stivarga which she tolerated well for approximately 6 months. She has now been changed to [place] secondary to progression.  She has only taken 2 days of her new medication.  She has been experiencing daily neck pain in the top right shoulder/neck. She has been given Flexeril with no relief. She is unsure where the pain eminates from. The pain starts in the deep tissue of the right neck and sometimes she is unable to move her right arm and shoulder. Sometimes the pain begins when she turns her head, she mentions that she has to be careful turning her head while driving. Putting strain on her neck to lie back causes pain. Dr. Edrick Oh recently gave her prednisone to try to help. She does not think it improved her pain but significantly elevated her blood sugars.  She has  been seen in the emergency room twice in the last 2 weeks for right flank pain, but she notes it is her neck pain that radiates into the flank.  She is still experiencing neck pain on the right side into the top right shoulder, she says "it feels really deep, almost like a pinched nerve". She explains that the pain medications don't help it. She is okay with having imaging of her neck done here at [facility] to find out what is causing it.   She feels nauseated occasionally, she says she sips a bit of buttermilk to alleviate it. She has not experienced any vomiting.     Colon adenocarcinoma   11/04/2013 Progression CT CAP and head- Interval progression of disease within the chest, abdomen. Progression of pulmonary metastasis. Increase in size of right hepatic lobe metastasis. Progression of retroperitoneal adenopathy and peritoneal adenopathy  metastasis   11/20/2013 - 05/11/2014 Chemotherapy Stivarga 80 mg daily 21 days on and 7 days off   05/07/2014 Progression CT CAP- The dominant right hepatic lobe mass is mildly increased in size, and there several new hypodense lesions in the liver which probably represent additional metastatic foci.   06/01/2014 -  Chemotherapy Lonsurf    Past Medical History  Diagnosis Date  . Colon cancer     liver ca  . HTN (hypertension)   . Thyroid condition   . Diabetes mellitus   . Shingles   . Acute UTI   . Nausea 02/06/2012  . Port catheter in place 08/15/2012  . Diabetes type 2, controlled 04/13/2012  .  Sore throat 02/09/14    has Colon adenocarcinoma; Thyroid condition; Diabetes type 2, controlled; HTN (hypertension), benign; Port catheter in place; Dry mouth; Nausea without vomiting; Trapezius muscle strain; and Reactive depression (situational) on her problem list.     has No Known Allergies.  We administered heparin lock flush and sodium chloride.  Past Surgical History  Procedure Laterality Date  . Colectomy      and bil. oopherectomy  . Appendectomy     . Blt    . Resection liver partial / total w/ radiofrequency ablation    . Port-a-cath removal      has new port now  . Portacath placement  09/2010  . Oophorectomy Bilateral 2007  . Cataract surgery Right 01/10/13  . Cataract surgery Left 01/24/13    Denies any headaches, dizziness, double vision, fevers, chills, night sweats, vomiting, diarrhea, constipation, chest pain, heart palpitations, shortness of breath, blood in stool, black tarry stool, urinary pain, urinary burning, urinary frequency, hematuria. Denies abdominal pain. Positive for nausea.      Alleviated with a bit of buttermilk. Positive for neck pain.       Right neck/top right of shoulder. Experiences this pain daily even with pain medications.  14 point review of systems was performed and is negative except as detailed under history of present illness and above   PHYSICAL EXAMINATION  ECOG PERFORMANCE STATUS: 1 - Symptomatic but completely ambulatory  Filed Vitals:   06/19/14 0841  BP: 152/78  Pulse: 86  Temp: 98.2 F (36.8 C)  Resp: 16    GENERAL:alert, no distress, well nourished, well developed, comfortable, cooperative and smiling SKIN: skin color, texture, turgor are normal, no rashes or significant lesions HEAD: Normocephalic, No masses, lesions, tenderness or abnormalities EYES: normal, PERRLA, EOMI, Conjunctiva are pink and non-injected EARS: External ears normal OROPHARYNX:lips, buccal mucosa, and tongue normal and mucous membranes are moist  NECK: supple, no adenopathy, thyroid normal size, non-tender, without nodularity, no stridor, non-tender, trachea midline     Right neck tender on palpation.  LYMPH:  no palpable lymphadenopathy BREAST:not examined LUNGS: clear to auscultation and percussion HEART: regular rate & rhythm, no murmurs, no gallops, S1 normal and S2 normal ABDOMEN:abdomen soft, non-tender and normal bowel sounds BACK: Back symmetric, no curvature., No CVA  tenderness EXTREMITIES:less then 2 second capillary refill, no joint deformities, effusion, or inflammation, no edema, no skin discoloration, no clubbing, no cyanosis  NEURO: alert & oriented x 3 with fluent speech, no focal motor/sensory deficits, gait normal   LABORATORY DATA: Results for [name], [name] (MRN [id number])   Ref. Range 06/19/2014 09:40  Sodium Latest Ref Range: 135-145 mmol/L 137  Potassium Latest Ref Range: 3.5-5.1 mmol/L 3.9  Chloride Latest Ref Range: 101-111 mmol/L 101  CO2 Latest Ref Range: 22-32 mmol/L 26  BUN Latest Ref Range: 6-20 mg/dL 13  Creatinine Latest Ref Range: 0.44-1.00 mg/dL 0.51  Calcium Latest Ref Range: 8.9-10.3 mg/dL 9.0  EGFR (Non-African Amer.) Latest Ref Range: >60 mL/min >60  EGFR (African American) Latest Ref Range: >60 mL/min >60  Glucose Latest Ref Range: 65-99 mg/dL 160 (H)  Anion gap Latest Ref Range: 5-15  10  Alkaline Phosphatase Latest Ref Range: 38-126 U/L 49  Albumin Latest Ref Range: 3.5-5.0 g/dL 3.9  AST Latest Ref Range: 15-41 U/L 22  ALT Latest Ref Range: 14-54 U/L 17  Total Protein Latest Ref Range: 6.5-8.1 g/dL 7.1  Total Bilirubin Latest Ref Range: 0.3-1.2 mg/dL 0.7  WBC Latest Ref Range: 4.0-10.5 K/uL 2.4 (  L)  RBC Latest Ref Range: 3.87-5.11 MIL/uL 3.23 (L)  Hemoglobin Latest Ref Range: 12.0-15.0 g/dL 10.8 (L)  HCT Latest Ref Range: 36.0-46.0 % 32.7 (L)  MCV Latest Ref Range: 78.0-100.0 fL 101.2 (H)  MCH Latest Ref Range: 26.0-34.0 pg 33.4  MCHC Latest Ref Range: 30.0-36.0 g/dL 33.0  RDW Latest Ref Range: 11.5-15.5 % 13.7  Platelets Latest Ref Range: 150-400 K/uL 274  Neutrophils Latest Ref Range: 43-77 % 51  Lymphocytes Latest Ref Range: 12-46 % 39  Monocytes Relative Latest Ref Range: 3-12 % 7  Eosinophil Latest Ref Range: 0-5 % 3  Basophil Latest Ref Range: 0-1 % 0  NEUT# Latest Ref Range: 1.7-7.7 K/uL 1.2 (L)  Lymphocyte # Latest Ref Range: 0.7-4.0 K/uL 1.0  Monocyte # Latest Ref Range: 0.1-1.0 K/uL 0.2   Eosinophils Absolute Latest Ref Range: 0.0-0.7 K/uL 0.1  Basophils Absolute Latest Ref Range: 0.0-0.1 K/uL 0.0     Lab Results  Component Value Date   CEA 151.4* 06/19/2014    ASSESSMENT AND PLAN:  Stage IV colorectal cancer with liver metastases Previous treatment with FOLFOX and Avastin, FOLFIRI K-ras mutated R sided neck pain  She will continue with her new medication, Lonsurf. She is only taken it for 2 days. She does report intermittent nausea. She would like to return to the clinic for her next port flush and be seen the same day, that is about 2 weeks away. I would prefer to have labs sooner but one of the patient's issues with therapy is frequent visits. We will therefore schedule her for return in 2 weeks with labs, port flush, physical exam.   I have called her in a prescription for omeprazole.  Ordered an MRI here at [facility] of her cervical spine for further evaluation of her pain. It is limiting her QOL and is a source of significant discomfort for her. Follow up after the MRI to discuss results of the scan.  All questions were answered. The patient knows to call the clinic with any problems, questions or concerns. We can certainly see the patient much sooner if necessary. Note was electronically signed  This document serves as a record of services personally performed by Ancil Linsey, MD. It was created on her behalf by Arlyce Harman, a trained medical scribe. The creation of this record is based on the scribe's personal observations and the provider's statements to them. This document has been checked and approved by the attending provider.  I have reviewed the above documentation for accuracy and completeness, and I agree with the above.  Ancil Linsey, MD

## 2014-06-19 NOTE — Patient Instructions (Signed)
[place] at [facility] Discharge Instructions  RECOMMENDATIONS MADE BY THE CONSULTANT AND ANY TEST RESULTS WILL BE SENT TO YOUR REFERRING PHYSICIAN.  Exam and discussion by Dr. Whitney Muse Will get MRI of your spine and see you after. Report uncontrolled pain, nausea, vomiting or other concerns.  Follow-up after scans.  Thank you for choosing [place] at [facility] to provide your oncology and hematology care.  To afford each patient quality time with our provider, please arrive at least 15 minutes before your scheduled appointment time.    You need to re-schedule your appointment should you arrive 10 or more minutes late.  We strive to give you quality time with our providers, and arriving late affects you and other patients whose appointments are after yours.  Also, if you no show three or more times for appointments you may be dismissed from the clinic at the providers discretion.     Again, thank you for choosing [facility].  Our hope is that these requests will decrease the amount of time that you wait before being seen by our physicians.       _____________________________________________________________  Should you have questions after your visit to [facility], please contact our office at [phone] between the hours of 8:30 a.m. and 4:30 p.m.  Voicemails left after 4:30 p.m. will not be returned until the following business day.  For prescription refill requests, have your pharmacy contact our office.

## 2014-06-19 NOTE — Progress Notes (Signed)
[name] presented for labwork. Labs per MD order drawn via Portacath located in the left chest wall accessed with  H 20 needle. Good blood return present. Procedure without incident.  Needle removed intact. Patient tolerated procedure well.   

## 2014-06-19 NOTE — Progress Notes (Signed)
Please see doctors encounter for more information 

## 2014-06-20 LAB — CEA: CEA: 151.4 ng/mL — ABNORMAL HIGH (ref 0.0–4.7)

## 2014-06-23 ENCOUNTER — Encounter (HOSPITAL_COMMUNITY): Payer: Self-pay

## 2014-07-02 ENCOUNTER — Ambulatory Visit (HOSPITAL_COMMUNITY)
Admission: RE | Admit: 2014-07-02 | Discharge: 2014-07-02 | Disposition: A | Discharge status: Home / Self Care | Payer: Medicare Other | Source: Ambulatory Visit | Attending: Hematology & Oncology | Admitting: Hematology & Oncology

## 2014-07-02 ENCOUNTER — Encounter (HOSPITAL_COMMUNITY): Payer: BLUE CROSS/BLUE SHIELD

## 2014-07-02 DIAGNOSIS — Z85038 Personal history of other malignant neoplasm of large intestine: Secondary | ICD-10-CM | POA: Diagnosis not present

## 2014-07-02 DIAGNOSIS — M2578 Osteophyte, vertebrae: Secondary | ICD-10-CM | POA: Diagnosis not present

## 2014-07-02 DIAGNOSIS — M25511 Pain in right shoulder: Secondary | ICD-10-CM | POA: Diagnosis not present

## 2014-07-02 DIAGNOSIS — C189 Malignant neoplasm of colon, unspecified: Secondary | ICD-10-CM

## 2014-07-02 DIAGNOSIS — M5022 Other cervical disc displacement, mid-cervical region: Secondary | ICD-10-CM | POA: Diagnosis not present

## 2014-07-02 DIAGNOSIS — M542 Cervicalgia: Secondary | ICD-10-CM | POA: Diagnosis present

## 2014-07-02 DIAGNOSIS — G8929 Other chronic pain: Secondary | ICD-10-CM

## 2014-07-02 MED ORDER — GADOBENATE DIMEGLUMINE 529 MG/ML IV SOLN
14.0000 mL | Freq: Once | INTRAVENOUS | Status: AC | PRN
  Administered 2014-07-02: 14 mL via INTRAVENOUS

## 2014-07-03 ENCOUNTER — Encounter (HOSPITAL_COMMUNITY): Payer: Self-pay | Admitting: Oncology

## 2014-07-03 ENCOUNTER — Encounter (HOSPITAL_BASED_OUTPATIENT_CLINIC_OR_DEPARTMENT_OTHER): Payer: Medicare Other

## 2014-07-03 ENCOUNTER — Encounter (HOSPITAL_COMMUNITY): Payer: Medicare Other | Attending: Internal Medicine | Admitting: Oncology

## 2014-07-03 VITALS — BP 155/75 | HR 74 | Temp 97.9°F | Resp 16 | Wt 152.1 lb

## 2014-07-03 DIAGNOSIS — C787 Secondary malignant neoplasm of liver and intrahepatic bile duct: Secondary | ICD-10-CM | POA: Diagnosis not present

## 2014-07-03 DIAGNOSIS — C7800 Secondary malignant neoplasm of unspecified lung: Secondary | ICD-10-CM

## 2014-07-03 DIAGNOSIS — C189 Malignant neoplasm of colon, unspecified: Secondary | ICD-10-CM | POA: Diagnosis not present

## 2014-07-03 DIAGNOSIS — E875 Hyperkalemia: Secondary | ICD-10-CM

## 2014-07-03 DIAGNOSIS — R0789 Other chest pain: Secondary | ICD-10-CM

## 2014-07-03 DIAGNOSIS — C19 Malignant neoplasm of rectosigmoid junction: Secondary | ICD-10-CM

## 2014-07-03 DIAGNOSIS — R112 Nausea with vomiting, unspecified: Secondary | ICD-10-CM

## 2014-07-03 DIAGNOSIS — C786 Secondary malignant neoplasm of retroperitoneum and peritoneum: Secondary | ICD-10-CM

## 2014-07-03 DIAGNOSIS — Z9889 Other specified postprocedural states: Secondary | ICD-10-CM | POA: Insufficient documentation

## 2014-07-03 DIAGNOSIS — C7989 Secondary malignant neoplasm of other specified sites: Secondary | ICD-10-CM

## 2014-07-03 LAB — COMPREHENSIVE METABOLIC PANEL
ALT: 19 U/L (ref 14–54)
AST: 35 U/L (ref 15–41)
Albumin: 4 g/dL (ref 3.5–5.0)
Alkaline Phosphatase: 61 U/L (ref 38–126)
Anion gap: 13 (ref 5–15)
BUN: 16 mg/dL (ref 6–20)
CHLORIDE: 98 mmol/L — AB (ref 101–111)
CO2: 26 mmol/L (ref 22–32)
CREATININE: 0.67 mg/dL (ref 0.44–1.00)
Calcium: 9.6 mg/dL (ref 8.9–10.3)
GFR calc non Af Amer: >60 mL/min (ref >60)
GLUCOSE: 137 mg/dL — AB (ref 65–99)
Potassium: 5.2 mmol/L — ABNORMAL HIGH (ref 3.5–5.1)
Sodium: 137 mmol/L (ref 135–145)
Total Bilirubin: 0.7 mg/dL (ref 0.3–1.2)
Total Protein: 7.6 g/dL (ref 6.5–8.1)

## 2014-07-03 LAB — CBC WITH DIFFERENTIAL/PLATELET
BASOS ABS: 0 10*3/uL (ref 0.0–0.1)
BASOS PCT: 0 % (ref 0–1)
EOS PCT: 0 % (ref 0–5)
Eosinophils Absolute: 0 10*3/uL (ref 0.0–0.7)
HEMATOCRIT: 35.5 % — AB (ref 36.0–46.0)
Hemoglobin: 11.6 g/dL — ABNORMAL LOW (ref 12.0–15.0)
LYMPHS PCT: 14 % (ref 12–46)
Lymphs Abs: 1.4 10*3/uL (ref 0.7–4.0)
MCH: 33.6 pg (ref 26.0–34.0)
MCHC: 32.7 g/dL (ref 30.0–36.0)
MCV: 102.9 fL — AB (ref 78.0–100.0)
MONOS PCT: 5 % (ref 3–12)
Monocytes Absolute: 0.5 10*3/uL (ref 0.1–1.0)
Neutro Abs: 8.2 10*3/uL — ABNORMAL HIGH (ref 1.7–7.7)
Neutrophils Relative %: 81 % — ABNORMAL HIGH (ref 43–77)
Platelets: 398 10*3/uL (ref 150–400)
RBC: 3.45 MIL/uL — ABNORMAL LOW (ref 3.87–5.11)
RDW: 14.4 % (ref 11.5–15.5)
WBC: 10.2 10*3/uL (ref 4.0–10.5)

## 2014-07-03 MED ORDER — MORPHINE SULFATE 2 MG/ML IJ SOLN
2.0000 mg | Freq: Once | INTRAMUSCULAR | Status: AC
  Administered 2014-07-03: 2 mg via INTRAVENOUS

## 2014-07-03 MED ORDER — SODIUM CHLORIDE 0.9 % IV SOLN
Freq: Once | INTRAVENOUS | Status: AC
  Administered 2014-07-03: 12:00:00 via INTRAVENOUS
  Filled 2014-07-03: qty 4

## 2014-07-03 MED ORDER — SODIUM POLYSTYRENE SULFONATE PO POWD: Freq: Once | ORAL | Status: DC

## 2014-07-03 MED ORDER — MORPHINE SULFATE 2 MG/ML IJ SOLN
2.0000 mg | Freq: Once | INTRAMUSCULAR | Status: AC
  Administered 2014-07-03: 2 mg via INTRAVENOUS
  Filled 2014-07-03: qty 1

## 2014-07-03 MED ORDER — HEPARIN SOD (PORK) LOCK FLUSH 100 UNIT/ML IV SOLN
500.0000 [IU] | Freq: Once | INTRAVENOUS | Status: AC
  Administered 2014-07-03: 500 [IU] via INTRAVENOUS

## 2014-07-03 MED ORDER — SODIUM CHLORIDE 0.9 % IV SOLN
INTRAVENOUS | Status: DC
  Administered 2014-07-03: 12:00:00 via INTRAVENOUS

## 2014-07-03 MED ORDER — SODIUM CHLORIDE 0.9 % IJ SOLN
10.0000 mL | INTRAMUSCULAR | Status: DC | PRN
  Administered 2014-07-03: 10 mL via INTRAVENOUS
  Filled 2014-07-03: qty 10

## 2014-07-03 MED ORDER — OXYCODONE HCL 5 MG PO TABS: 5.0000 mg | ORAL_TABLET | ORAL | Status: DC | PRN

## 2014-07-03 MED ORDER — HEPARIN SOD (PORK) LOCK FLUSH 100 UNIT/ML IV SOLN
INTRAVENOUS | Status: AC
  Filled 2014-07-03: qty 5

## 2014-07-03 MED ORDER — MORPHINE SULFATE 2 MG/ML IJ SOLN
INTRAMUSCULAR | Status: AC
  Filled 2014-07-03: qty 1

## 2014-07-03 NOTE — Assessment & Plan Note (Addendum)
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  Progression of disease noted on salvage Stivarga leading to a change in therapy to [place].  Neck pain of late with recent C-spine MRI demonstrating significant neck disease with facet inflammation and bulging disc causing significant right sided pain.    She notes a 8-12 hour history of nausea with vomiting.  150 cc bilious vomit noted in the clinic.  Will set-up for IV fluids with Zofran and Dex.  2 mg of IV morphine ordered.  Will re-convene with the patient later today.  On follow-up at 12:30 PM, she notes nausea is improved.  No more vomiting.  Pain continues.  It is located on right lateral chest.  It is achey in nature, constant, and "deep."  Palpation does not change pain level.  2 mg of IV morphine ordered again.  Will recheck again early this afternoon.  Recheck at 1400 hours.  She reports her pain is gone with the second dose of IV morphine.  She is educated to use Ativan SL every 4 hours PRN nausea/vomiting.  She also can use Zofran q 8 hours.  She has not been eating x 1 week.   Her weight is stable x 2 months at around 152 lbs.    She will be discharged from the clinic today knowing the the ED is available to her.  We will refer her to local pain clinic for her neck pain.  She may have a gastroenteritis which is causing her nausea and vomiting over the past 6-12 hours.  She has home antiemetics she can use.  Her neck pain may be playing a part in this as well.    If nausea/vomiting does not improve over the next 24 hours, she is to report to the ED.  If pain increases and is not controlled with Oxy IR, she is to report to the ED too.  Hyperkalemia noted, will order 1 dose of Kayexalate.  She will restart her Lonsurf on Monday or Tuesday.  Return in 2 weeks for follow-up.

## 2014-07-03 NOTE — Progress Notes (Signed)
1230:  Denies change in pain.  Caroleen Hamman, PA notified. 1245:  Medicated for pain as ordered. 1310:  Reports pain decreased from 10/10 to 6/10; states, "I feel so much better.". 1440:  Reports pain is now 4/10; alert, in no distress; VSS.  Discharged ambulatory in c/o spouse for transport home.  See office visit encounter for assessment, medication administration record, and further documentation.

## 2014-07-03 NOTE — Patient Instructions (Signed)
[place] at [facility]  Discharge Instructions:  Pain medication prescribed.  May take 1-2 tablets every 4 hours as needed for pain  If pain worsens and is not controlled with pain medication, report to ED. Continue Home anti-nausea medicine:  1. Ativan under the tongue every 4 hours as needed for nausea/vomiting  2. Zofran every 8 hours as needed for nausea/vomiting. If nausea and vomiting persists for more than 24 more hours, report to the ED. I have sent in a prescription for Kayexalate at [facility] in [place].  Your potassium is a little high, probably from your vomiting.  This is a 1 time dose.  If you are taking any potassium supplements, put them on hold for now. I will refer you to a pain clinic in [place]. Continue chemotherapy pill as planned.  Restart on Monday or Tuesday.   Labs in 2 weeks. Return in 2 weeks. Please call the clinic or report to ED with any issues. _______________________________________________________________  Thank you for choosing [place] at [facility] to provide your oncology and hematology care.  To afford each patient quality time with our providers, please arrive at least 15 minutes before your scheduled appointment.  You need to re-schedule your appointment if you arrive 10 or more minutes late.  We strive to give you quality time with our providers, and arriving late affects you and other patients whose appointments are after yours.  Also, if you no show three or more times for appointments you may be dismissed from the clinic.  Again, thank you for choosing [place] at [place] hope is that these requests will allow you access to exceptional care and in a timely manner. _______________________________________________________________  If you have questions after your visit, please contact our office at [phone] between the hours of 8:30 a.m. and 5:00 p.m. Voicemails  left after 4:30 p.m. will not be returned until the following business day. _______________________________________________________________  For prescription refill requests, have your pharmacy contact our office. _______________________________________________________________  Recommendations made by the consultant and any test results will be sent to your referring physician. _

## 2014-07-03 NOTE — Progress Notes (Signed)
LABS DRAWN

## 2014-07-03 NOTE — Patient Instructions (Signed)
[place] at [facility]  Discharge Instructions:  Pain medication prescribed.  May take 1-2 tablets every 4 hours as needed for pain  If pain worsens and is not controlled with pain medication, report to ED. Continue Home anti-nausea medicine:  1. Ativan under the tongue every 4 hours as needed for nausea/vomiting  2. Zofran every 8 hours as needed for nausea/vomiting. If nausea and vomiting persists for more than 24 more hours, report to the ED. I have sent in a prescription for Kayexalate at [facility] in [place].  Your potassium is a little high, probably from your vomiting.  This is a 1 time dose.  If you are taking any potassium supplements, put them on hold for now. I will refer you to a pain clinic in [place]. Continue chemotherapy pill as planned.  Restart on Monday or Tuesday.   Labs in 2 weeks. Return in 2 weeks. Please call the clinic or report to ED with any issues. _______________________________________________________________  Thank you for choosing [place] at [facility] to provide your oncology and hematology care.  To afford each patient quality time with our providers, please arrive at least 15 minutes before your scheduled appointment.  You need to re-schedule your appointment if you arrive 10 or more minutes late.  We strive to give you quality time with our providers, and arriving late affects you and other patients whose appointments are after yours.  Also, if you no show three or more times for appointments you may be dismissed from the clinic.  Again, thank you for choosing [place] at [place] hope is that these requests will allow you access to exceptional care and in a timely manner. _______________________________________________________________  If you have questions after your visit, please contact our office at [phone] between the hours of 8:30 a.m. and 5:00 p.m. Voicemails  left after 4:30 p.m. will not be returned until the following business day. _______________________________________________________________  For prescription refill requests, have your pharmacy contact our office. _______________________________________________________________  Recommendations made by the consultant and any test results will be sent to your referring physician. _______________________________________________________________

## 2014-07-03 NOTE — Progress Notes (Signed)
Anita Mustache, MD [place] [id number]  Colon adenocarcinoma - Plan: CBC with Differential, Comprehensive metabolic panel, CBC with Differential, Comprehensive metabolic panel, CEA, metFORMIN (GLUCOPHAGE-XR) 500 MG 24 hr tablet, 0.9 %  sodium chloride infusion, ondansetron (ZOFRAN) 8 mg, dexamethasone (DECADRON) 10 mg in sodium chloride 0.9 % 50 mL IVPB, morphine 2 MG/ML injection 2 mg, CBC with Differential, Comprehensive metabolic panel, morphine 2 MG/ML injection 2 mg, oxyCODONE (OXY IR/ROXICODONE) 5 MG immediate release tablet  Hyperkalemia - Plan: sodium polystyrene (KAYEXALATE) powder  CURRENT THERAPY: [place]  INTERVAL HISTORY: [name] 74 y.o. 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  Progression of disease noted on salvage Stivarga leading to a change in therapy to [place].    Colon adenocarcinoma   11/04/2013 Progression CT CAP and head- Interval progression of disease within the chest, abdomen. Progression of pulmonary metastasis. Increase in size of right hepatic lobe metastasis. Progression of retroperitoneal adenopathy and peritoneal adenopathy  metastasis   11/20/2013 - 05/11/2014 Chemotherapy Stivarga 80 mg daily 21 days on and 7 days off   05/07/2014 Progression CT CAP- The dominant right hepatic lobe mass is mildly increased in size, and there several new hypodense lesions in the liver which probably represent additional metastatic foci.   06/01/2014 -  Chemotherapy [place]    I personally reviewed and went over laboratory results with the patient.  The results are noted within this dictation.  I personally reviewed and went over radiographic studies with the patient.  The results are noted within this dictation.  MRI of c-spine demonstrating: 1. Marked inflammation of the right facet joint at C3-4. This could irritate the right C4 nerve although it is not impinged upon. 2. Auto fusion of the  left facet joint at C2-3. 3. Small disc protrusion with accompanying osteophytes at C5-6 to the right with a slight mass effect upon the ventral aspect of the right side of the spinal cord which might affect the ventral nerve rootlet of the right C6 nerve.  She may benefit from pain management referral.  She is seen in clinic and witnessed to have a 150 cc billous vomit.  She reports that this has been ongoing since late last night.  She is pale and not feeling well.  She denies any sick contacts at home.  She denies any fevers.  She denies any relationship to recent food consumption.      Past Medical History  Diagnosis Date  . Colon cancer     liver ca  . HTN (hypertension)   . Thyroid condition   . Diabetes mellitus   . Shingles   . Acute UTI   . Nausea 02/06/2012  . Port catheter in place 08/15/2012  . Diabetes type 2, controlled 04/13/2012  . Sore throat 02/09/14    has Colon adenocarcinoma; Thyroid condition; Diabetes type 2, controlled; HTN (hypertension), benign; Port catheter in place; Dry mouth; Nausea without vomiting; Trapezius muscle strain; and Reactive depression (situational) on her problem list.     has No Known Allergies.  Current Outpatient Prescriptions on File Prior to Visit  Medication Sig Dispense Refill  . amLODipine (NORVASC) 5 MG tablet Take 5 mg by mouth daily.    . dorzolamide-timolol (COSOPT) 22.3-6.8 MG/ML ophthalmic solution Place 1 drop into both eyes 2 (two) times daily.     Marland Kitchen glipiZIDE (GLUCOTROL XL) 5 MG 24 hr tablet Take 5 mg by mouth daily.  0  . ibuprofen (ADVIL,MOTRIN) 200 MG tablet Take 200 mg by mouth every 6 (six) hours as needed for mild pain.     Marland Kitchen levothyroxine (SYNTHROID, LEVOTHROID) 75 MCG tablet Take 75 mcg by mouth daily before breakfast.    . LORazepam (ATIVAN) 0.5 MG tablet Take 2 tablets (1 mg total) by mouth every 8 (eight) hours as needed for anxiety or sleep. 60 tablet 5  . methocarbamol (ROBAXIN) 500 MG tablet Take 3 tablets  three times daily as needed for muscle spasm 42 tablet 6  . omeprazole (PRILOSEC) 40 MG capsule Take 1 capsule (40 mg total) by mouth daily. 30 capsule 3  . ondansetron (ZOFRAN) 8 MG tablet Take 1 tablet (8 mg total) by mouth every 8 (eight) hours as needed for nausea or vomiting. 45 tablet 5  . tiZANidine (ZANAFLEX) 4 MG tablet   0  . traMADol (ULTRAM) 50 MG tablet Take 1 tablet (50 mg total) by mouth every 6 (six) hours as needed. 20 tablet 0  . trifluridine-tipiracil (LONSURF) 20-8.19 MG tablet Take 3 tablets (60 mg of trifluridine total) by mouth 2 (two) times daily after a meal. 1 hr after AM & PM meals on days 1-5, 8-12. Repeat every 28day 60 tablet 1   Current Facility-Administered Medications on File Prior to Visit  Medication Dose Route Frequency Provider Last Rate Last Dose  . sodium chloride 0.9 % injection 10 mL  10 mL Intravenous PRN Patrici Ranks, MD   10 mL at 06/19/14 0932    Past Surgical History  Procedure Laterality Date  . Colectomy      and bil. oopherectomy  . Appendectomy    . Blt    . Resection liver partial / total w/ radiofrequency ablation    . Port-a-cath removal      has new port now  . Portacath placement  09/2010  . Oophorectomy Bilateral 2007  . Cataract surgery Right 01/10/13  . Cataract surgery Left 01/24/13    Denies any headaches, dizziness, double vision, fevers, chills, night sweats, diarrhea, constipation, chest pain, heart palpitations, shortness of breath, blood in stool, black tarry stool, urinary pain, urinary burning, urinary frequency, hematuria.   PHYSICAL EXAMINATION  ECOG PERFORMANCE STATUS: 2 - Symptomatic, <50% confined to bed  Filed Vitals:   07/03/14 1029  BP: 157/74  Pulse: 62  Temp: 97.9 F (36.6 C)  Resp: 20    GENERAL:alert, ill looking and mild distress SKIN: skin color, texture, turgor are normal, no rashes or significant lesions HEAD: Normocephalic, No masses, lesions, tenderness or abnormalities EYES: normal,  PERRLA, EOMI, Conjunctiva are pink and non-injected EARS: External ears normal OROPHARYNX:lips, buccal mucosa, and tongue normal and mucous membranes are moist  NECK: supple, trachea midline LYMPH:  no palpable lymphadenopathy BREAST:not examined LUNGS: clear to auscultation  HEART: regular rate & rhythm ABDOMEN:abdomen soft BACK: Back symmetric, no curvature. EXTREMITIES:less then 2 second capillary refill, no skin discoloration, no cyanosis  NEURO: alert & oriented x 3 with fluent speech, no focal motor/sensory deficits   LABORATORY DATA: CBC    Component Value Date/Time   WBC 10.2 07/03/2014 1117   RBC 3.45* 07/03/2014 1117   HGB 11.6* 07/03/2014 1117   HCT 35.5* 07/03/2014 1117   PLT 398 07/03/2014 1117   MCV 102.9* 07/03/2014 1117   MCH 33.6 07/03/2014 1117   MCHC 32.7 07/03/2014 1117   RDW 14.4 07/03/2014 1117   LYMPHSABS 1.4 07/03/2014 1117   MONOABS 0.5 07/03/2014 1117   EOSABS 0.0  07/03/2014 1117   BASOSABS 0.0 07/03/2014 1117      Chemistry      Component Value Date/Time   NA 137 07/03/2014 1117   K 5.2* 07/03/2014 1117   CL 98* 07/03/2014 1117   CO2 26 07/03/2014 1117   BUN 16 07/03/2014 1117   CREATININE 0.67 07/03/2014 1117      Component Value Date/Time   CALCIUM 9.6 07/03/2014 1117   ALKPHOS 61 07/03/2014 1117   AST 35 07/03/2014 1117   ALT 19 07/03/2014 1117   BILITOT 0.7 07/03/2014 1117      Lab Results  Component Value Date   CEA 151.4* 06/19/2014    RADIOGRAPHIC STUDIES:  Mr Cervical Spine W Wo Contrast  07/02/2014   CLINICAL DATA:  Right-sided severe neck pain and right shoulder pain. History of colon cancer.  EXAM: MRI CERVICAL SPINE WITHOUT AND WITH CONTRAST  TECHNIQUE: Multiplanar and multiecho pulse sequences of the cervical spine, to include the craniocervical junction and cervicothoracic junction, were obtained according to standard protocol without and with intravenous contrast.  CONTRAST:  56m MULTIHANCE GADOBENATE DIMEGLUMINE  529 MG/ML IV SOLN  COMPARISON:  PET-CT dated 04/19/2012  FINDINGS: The visualized intracranial contents are normal. Paraspinal soft tissues are normal.  Craniocervical junction and C1-2:  Normal.  C2-3: The disc is normal. The left facet joint is auto fused. No foraminal stenosis.  C3-4: Severe inflammation in and around the right facet joint with edema in the lateral masses and in the adjacent soft tissues. The lateral masses and adjacent soft tissues enhance after contrast administration. This should irritate the right C4 nerve although it is not discretely compressed. Slight degenerative changes the left facet joint. The disc is normal.  C4-5: Tiny central disc bulge with no neural impingement. Slight degenerative changes of both facet joints.  C5-6: Focal disc osteophyte complex just to the right of midline slightly distorts the ventral aspect of the spinal cord and could affect the right C6 ventral nerve rootlet. No myelopathy.  C6-7:  Tiny central disc bulge.  No neural impingement.  C7-T1: Normal.  T1-2:  Moderate right and slight left facet arthritis.  IMPRESSION: 1. Marked inflammation of the right facet joint at C3-4. This could irritate the right C4 nerve although it is not impinged upon. 2. Auto fusion of the left facet joint at C2-3. 3. Small disc protrusion with accompanying osteophytes at C5-6 to the right with a slight mass effect upon the ventral aspect of the right side of the spinal cord which might affect the ventral nerve rootlet of the right C6 nerve.   Electronically Signed   By: JLorriane ShireM.D.   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  Progression of disease noted on salvage Stivarga leading to a change in therapy to [place]  Neck pain of late with recent C-spine MRI demonstrating significant neck disease with facet inflammation and bulging disc causing significant right sided pain.    She notes a  8-12 hour history of nausea with vomiting.  150 cc bilious vomit noted in the clinic.  Will set-up for IV fluids with Zofran and Dex.  2 mg of IV morphine ordered.  Will re-convene with the patient later today.  On follow-up at 12:30 PM, she notes nausea is improved.  No more vomiting.  Pain continues.  It is located on right lateral chest.  It is achey in nature, constant, and "  deep."  Palpation does not change pain level.  2 mg of IV morphine ordered again.  Will recheck again early this afternoon.  Recheck at 1400 hours.  She reports her pain is gone with the second dose of IV morphine.  She is educated to use Ativan SL every 4 hours PRN nausea/vomiting.  She also can use Zofran q 8 hours.  She has not been eating x 1 week.   Her weight is stable x 2 months at around 152 lbs.    She will be discharged from the clinic today knowing the the ED is available to her.  We will refer her to local pain clinic for her neck pain.  She may have a gastroenteritis which is causing her nausea and vomiting over the past 6-12 hours.  She has home antiemetics she can use.  Her neck pain may be playing a part in this as well.    If nausea/vomiting does not improve over the next 24 hours, she is to report to the ED.  If pain increases and is not controlled with Oxy IR, she is to report to the ED too.  Hyperkalemia noted, will order 1 dose of Kayexalate.  She will restart her L[place]f on Monday or Tuesday.  Return in 2 weeks for follow-up.    THERAPY PLAN:  As planned.  All questions were answered. The patient knows to call the clinic with any problems, questions or concerns. We can certainly see the patient much sooner if necessary.  Patient and plan discussed with Dr. Ancil Linsey and she is in agreement with the aforementioned.   This note is electronically signed by: Doy Mince 07/03/2014 2:27 PM

## 2014-07-06 ENCOUNTER — Encounter (HOSPITAL_COMMUNITY): Payer: Self-pay | Admitting: Hematology & Oncology

## 2014-07-07 ENCOUNTER — Telehealth (HOSPITAL_COMMUNITY): Payer: Self-pay | Admitting: Hematology & Oncology

## 2014-07-07 NOTE — Telephone Encounter (Signed)
Faxed referral to dr Merlene Laughter

## 2014-07-09 ENCOUNTER — Telehealth (HOSPITAL_COMMUNITY): Payer: Self-pay

## 2014-07-09 NOTE — Telephone Encounter (Signed)
Call from patient regarding inability to get Kayexalate.  Discussed with Robynn Pane, PA-C and due to only availability from pharmacy of a large volume, prescription was cancelled.  Patient instructed to avoid dietary items with potassium.  Verbalized understanding of instructions.

## 2014-07-14 ENCOUNTER — Encounter (HOSPITAL_COMMUNITY): Payer: Medicare Other

## 2014-07-17 ENCOUNTER — Encounter (HOSPITAL_BASED_OUTPATIENT_CLINIC_OR_DEPARTMENT_OTHER): Payer: Medicare Other | Admitting: Oncology

## 2014-07-17 ENCOUNTER — Encounter (HOSPITAL_BASED_OUTPATIENT_CLINIC_OR_DEPARTMENT_OTHER): Payer: Medicare Other

## 2014-07-17 ENCOUNTER — Encounter (HOSPITAL_COMMUNITY): Payer: Self-pay | Admitting: Oncology

## 2014-07-17 VITALS — BP 136/75 | HR 64 | Temp 97.8°F | Resp 16 | Wt 150.7 lb

## 2014-07-17 DIAGNOSIS — C787 Secondary malignant neoplasm of liver and intrahepatic bile duct: Secondary | ICD-10-CM

## 2014-07-17 DIAGNOSIS — M542 Cervicalgia: Secondary | ICD-10-CM | POA: Diagnosis not present

## 2014-07-17 DIAGNOSIS — Z9889 Other specified postprocedural states: Secondary | ICD-10-CM | POA: Diagnosis not present

## 2014-07-17 DIAGNOSIS — C19 Malignant neoplasm of rectosigmoid junction: Secondary | ICD-10-CM

## 2014-07-17 DIAGNOSIS — C189 Malignant neoplasm of colon, unspecified: Secondary | ICD-10-CM

## 2014-07-17 LAB — CBC WITH DIFFERENTIAL/PLATELET
BASOS ABS: 0.1 10*3/uL (ref 0.0–0.1)
Basophils Relative: 1 % (ref 0–1)
Eosinophils Absolute: 0.3 10*3/uL (ref 0.0–0.7)
Eosinophils Relative: 4 % (ref 0–5)
HEMATOCRIT: 28.7 % — AB (ref 36.0–46.0)
Hemoglobin: 8.8 g/dL — ABNORMAL LOW (ref 12.0–15.0)
LYMPHS PCT: 15 % (ref 12–46)
Lymphs Abs: 0.9 10*3/uL (ref 0.7–4.0)
MCH: 29.6 pg (ref 26.0–34.0)
MCHC: 30.7 g/dL (ref 30.0–36.0)
MCV: 96.6 fL (ref 78.0–100.0)
MONO ABS: 0.8 10*3/uL (ref 0.1–1.0)
Monocytes Relative: 13 % — ABNORMAL HIGH (ref 3–12)
NEUTROS ABS: 4.1 10*3/uL (ref 1.7–7.7)
Neutrophils Relative %: 67 % (ref 43–77)
Platelets: 213 10*3/uL (ref 150–400)
RBC: 2.97 MIL/uL — ABNORMAL LOW (ref 3.87–5.11)
RDW: 15.3 % (ref 11.5–15.5)
WBC: 6.2 10*3/uL (ref 4.0–10.5)

## 2014-07-17 LAB — COMPREHENSIVE METABOLIC PANEL
ALBUMIN: 3.7 g/dL (ref 3.5–5.0)
ALK PHOS: 53 U/L (ref 38–126)
ALT: 16 U/L (ref 14–54)
AST: 20 U/L (ref 15–41)
Anion gap: 9 (ref 5–15)
BUN: 11 mg/dL (ref 6–20)
CO2: 28 mmol/L (ref 22–32)
CREATININE: 0.63 mg/dL (ref 0.44–1.00)
Calcium: 8.9 mg/dL (ref 8.9–10.3)
Chloride: 102 mmol/L (ref 101–111)
GFR calc Af Amer: >60 mL/min (ref >60)
Glucose, Bld: 141 mg/dL — ABNORMAL HIGH (ref 65–99)
Potassium: 4.1 mmol/L (ref 3.5–5.1)
Sodium: 139 mmol/L (ref 135–145)
TOTAL PROTEIN: 6.6 g/dL (ref 6.5–8.1)
Total Bilirubin: 0.6 mg/dL (ref 0.3–1.2)

## 2014-07-17 LAB — FERRITIN: Ferritin: 39 ng/mL (ref 11–307)

## 2014-07-17 NOTE — Progress Notes (Signed)
LABS DRAWN

## 2014-07-17 NOTE — Addendum Note (Signed)
Addended by: Mellissa Kohut on: 07/17/2014 11:36 AM   Modules accepted: Orders

## 2014-07-17 NOTE — Assessment & Plan Note (Addendum)
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  Progression of disease noted on salvage Stivarga leading to a change in therapy to [place].  Neck pain of late with recent C-spine MRI demonstrating significant neck disease with facet inflammation and bulging disc causing significant right sided pain.   She has been referred to pain management.  She notes that the pain is improved with recent Rx of Prednisone from Dr. Edrick Oh.  She is disinterested in pain clinic referral at this time.  Labs today are pending.  Hgb reported to be well below baseline.  Patient sent home with 3 stool cards and I added a ferritin to her lab tests.  Continue Lonsurf as directed.  Return as scheduled for follow-up.

## 2014-07-17 NOTE — Progress Notes (Addendum)
[name], MD [place] [id number]  Colon adenocarcinoma - Plan: Ferritin, Ferritin  CURRENT THERAPY: [place]  INTERVAL HISTORY: [name] 74 y.o. 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  Progression of disease noted on salvage Stivarga leading to a change in therapy to [place].    Colon adenocarcinoma   11/04/2013 Progression CT CAP and head- Interval progression of disease within the chest, abdomen. Progression of pulmonary metastasis. Increase in size of right hepatic lobe metastasis. Progression of retroperitoneal adenopathy and peritoneal adenopathy  metastasis   11/20/2013 - 05/11/2014 Chemotherapy Stivarga 80 mg daily 21 days on and 7 days off   05/07/2014 Progression CT CAP- The dominant right hepatic lobe mass is mildly increased in size, and there several new hypodense lesions in the liver which probably represent additional metastatic foci.   06/01/2014 -  Chemotherapy [place]    I personally reviewed and went over laboratory results with the patient.  The results are noted within this dictation.  Labs are pending from today.  She reports that she is much improved compared to our last encounter.  She reports that she is on a pulse of prednisone from Dr. Edrick Oh and her pain (neck) is much improved.  "I do not think I want to go to a pain clinic."  She is hopeful to make plans for a September trip to [place], [place].  She is informed that with a response to therapy, this may be a viable trip, but of course, I cannot predict the future.  She understands.     Past Medical History  Diagnosis Date  . Colon cancer     liver ca  . HTN (hypertension)   . Thyroid condition   . Diabetes mellitus   . Shingles   . Acute UTI   . Nausea 02/06/2012  . Port catheter in place 08/15/2012  . Diabetes type 2, controlled 04/13/2012  . Sore throat 02/09/14    has Colon adenocarcinoma; Thyroid condition; Diabetes type  2, controlled; HTN (hypertension), benign; Port catheter in place; Dry mouth; Nausea without vomiting; Trapezius muscle strain; and Reactive depression (situational) on her problem list.     has No Known Allergies.  Current Outpatient Prescriptions on File Prior to Visit  Medication Sig Dispense Refill  . amLODipine (NORVASC) 5 MG tablet Take 5 mg by mouth daily.    . dorzolamide-timolol (COSOPT) 22.3-6.8 MG/ML ophthalmic solution Place 1 drop into both eyes 2 (two) times daily.     Marland Kitchen glipiZIDE (GLUCOTROL XL) 5 MG 24 hr tablet Take 5 mg by mouth daily.  0  . ibuprofen (ADVIL,MOTRIN) 200 MG tablet Take 200 mg by mouth every 6 (six) hours as needed for mild pain.     Marland Kitchen levothyroxine (SYNTHROID, LEVOTHROID) 75 MCG tablet Take 75 mcg by mouth daily before breakfast.    . LORazepam (ATIVAN) 0.5 MG tablet Take 2 tablets (1 mg total) by mouth every 8 (eight) hours as needed for anxiety or sleep. 60 tablet 5  . metFORMIN (GLUCOPHAGE-XR) 500 MG 24 hr tablet Take 500 mg by mouth 2 (two) times daily.  1  . methocarbamol (ROBAXIN) 500 MG tablet Take 3 tablets three times daily as needed for muscle spasm 42 tablet 6  . omeprazole (PRILOSEC) 40 MG capsule Take 1 capsule (40 mg total) by mouth daily. 30 capsule 3  . ondansetron (ZOFRAN) 8 MG tablet Take 1 tablet (8 mg total) by  mouth every 8 (eight) hours as needed for nausea or vomiting. 45 tablet 5  . oxyCODONE (OXY IR/ROXICODONE) 5 MG immediate release tablet Take 1-2 tablets (5-10 mg total) by mouth every 4 (four) hours as needed for severe pain. 60 tablet 0  . tiZANidine (ZANAFLEX) 4 MG tablet   0  . traMADol (ULTRAM) 50 MG tablet Take 1 tablet (50 mg total) by mouth every 6 (six) hours as needed. 20 tablet 0  . trifluridine-tipiracil (LONSURF) 20-8.19 MG tablet Take 3 tablets (60 mg of trifluridine total) by mouth 2 (two) times daily after a meal. 1 hr after AM & PM meals on days 1-5, 8-12. Repeat every 28day 60 tablet 1   No current  facility-administered medications on file prior to visit.    Past Surgical History  Procedure Laterality Date  . Colectomy      and bil. oopherectomy  . Appendectomy    . Blt    . Resection liver partial / total w/ radiofrequency ablation    . Port-a-cath removal      has new port now  . Portacath placement  09/2010  . Oophorectomy Bilateral 2007  . Cataract surgery Right 01/10/13  . Cataract surgery Left 01/24/13    Denies any headaches, dizziness, double vision, fevers, chills, night sweats, diarrhea, constipation, chest pain, heart palpitations, shortness of breath, blood in stool, black tarry stool, urinary pain, urinary burning, urinary frequency, hematuria.   PHYSICAL EXAMINATION  ECOG PERFORMANCE STATUS: 2 - Symptomatic, <50% confined to bed  Filed Vitals:   07/17/14 1024  BP: 136/75  Pulse: 64  Temp: 97.8 F (36.6 C)  Resp: 16    GENERAL:alert, NED. SKIN: skin color, texture, turgor are normal, no rashes or significant lesions HEAD: Normocephalic, No masses, lesions, tenderness or abnormalities EYES: normal, PERRLA, EOMI, Conjunctiva are pink and non-injected EARS: External ears normal OROPHARYNX:lips, buccal mucosa, and tongue normal and mucous membranes are moist  NECK: supple, trachea midline LYMPH:  no palpable lymphadenopathy BREAST:not examined LUNGS: clear to auscultation  HEART: regular rate & rhythm without murmur ABDOMEN:abdomen soft, nontender BACK: Back symmetric, no curvature. EXTREMITIES:less then 2 second capillary refill, no skin discoloration, no cyanosis  NEURO: alert & oriented x 3 with fluent speech, no focal motor/sensory deficits   LABORATORY DATA: CBC    Component Value Date/Time   WBC 6.2 07/17/2014 0954   RBC 2.97* 07/17/2014 0954   HGB 8.8* 07/17/2014 0954   HCT 28.7* 07/17/2014 0954   PLT 213 07/17/2014 0954   MCV 96.6 07/17/2014 0954   MCH 29.6 07/17/2014 0954   MCHC 30.7 07/17/2014 0954   RDW 15.3 07/17/2014 0954    LYMPHSABS 0.9 07/17/2014 0954   MONOABS 0.8 07/17/2014 0954   EOSABS 0.3 07/17/2014 0954   BASOSABS 0.1 07/17/2014 0954      Chemistry      Component Value Date/Time   NA 139 07/17/2014 0954   K 4.1 07/17/2014 0954   CL 102 07/17/2014 0954   CO2 28 07/17/2014 0954   BUN 11 07/17/2014 0954   CREATININE 0.63 07/17/2014 0954      Component Value Date/Time   CALCIUM 8.9 07/17/2014 0954   ALKPHOS 53 07/17/2014 0954   AST 20 07/17/2014 0954   ALT 16 07/17/2014 0954   BILITOT 0.6 07/17/2014 0954      Lab Results  Component Value Date   CEA 151.4* 06/19/2014    RADIOGRAPHIC STUDIES:  Mr Cervical Spine W Wo Contrast  07/02/2014   CLINICAL DATA:  Right-sided severe neck pain and right shoulder pain. History of colon cancer.  EXAM: MRI CERVICAL SPINE WITHOUT AND WITH CONTRAST  TECHNIQUE: Multiplanar and multiecho pulse sequences of the cervical spine, to include the craniocervical junction and cervicothoracic junction, were obtained according to standard protocol without and with intravenous contrast.  CONTRAST:  3m MULTIHANCE GADOBENATE DIMEGLUMINE 529 MG/ML IV SOLN  COMPARISON:  PET-CT dated 04/19/2012  FINDINGS: The visualized intracranial contents are normal. Paraspinal soft tissues are normal.  Craniocervical junction and C1-2:  Normal.  C2-3: The disc is normal. The left facet joint is auto fused. No foraminal stenosis.  C3-4: Severe inflammation in and around the right facet joint with edema in the lateral masses and in the adjacent soft tissues. The lateral masses and adjacent soft tissues enhance after contrast administration. This should irritate the right C4 nerve although it is not discretely compressed. Slight degenerative changes the left facet joint. The disc is normal.  C4-5: Tiny central disc bulge with no neural impingement. Slight degenerative changes of both facet joints.  C5-6: Focal disc osteophyte complex just to the right of midline slightly distorts the ventral aspect  of the spinal cord and could affect the right C6 ventral nerve rootlet. No myelopathy.  C6-7:  Tiny central disc bulge.  No neural impingement.  C7-T1: Normal.  T1-2:  Moderate right and slight left facet arthritis.  IMPRESSION: 1. Marked inflammation of the right facet joint at C3-4. This could irritate the right C4 nerve although it is not impinged upon. 2. Auto fusion of the left facet joint at C2-3. 3. Small disc protrusion with accompanying osteophytes at C5-6 to the right with a slight mass effect upon the ventral aspect of the right side of the spinal cord which might affect the ventral nerve rootlet of the right C6 nerve.   Electronically Signed   By: JLorriane ShireM.D.   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  Progression of disease noted on salvage St[place]a leading to a change in therapy to [place]  Neck pain of late with recent C-spine MRI demonstrating significant neck disease with facet inflammation and bulging disc causing significant right sided pain.   She has been referred to pain management.  She notes that the pain is improved with recent Rx of Prednisone from Dr. NEdrick Oh  She is disinterested in pain clinic referral at this time.  Labs today are pending.  Hgb reported to be well below baseline.  Patient sent home with 3 stool cards and I added a ferritin to her lab tests.  Continue L[place]f as directed.  Return as scheduled for follow-up.    THERAPY PLAN:  As planned.  All questions were answered. The patient knows to call the clinic with any problems, questions or concerns. We can certainly see the patient much sooner if necessary.  Patient and plan discussed with Dr. SAncil Linseyand she is in agreement with the aforementioned.   This note is electronically signed by: KDoy Mince7/15/2016 2:29 PM

## 2014-07-17 NOTE — Patient Instructions (Addendum)
[place] at [facility] Discharge Instructions  RECOMMENDATIONS MADE BY THE CONSULTANT AND ANY TEST RESULTS WILL BE SENT TO YOUR REFERRING PHYSICIAN.  Exam and discussion by Robynn Pane, PA-C Your hemoglobin has dropped so we need to check your bowel movement for blood and will also check your iron level. Continue Lonsurf as ordered Call with any concerns Follow-up as scheduled.  Thank you for choosing [place] at [facility] to provide your oncology and hematology care.  To afford each patient quality time with our provider, please arrive at least 15 minutes before your scheduled appointment time.    You need to re-schedule your appointment should you arrive 10 or more minutes late.  We strive to give you quality time with our providers, and arriving late affects you and other patients whose appointments are after yours.  Also, if you no show three or more times for appointments you may be dismissed from the clinic at the providers discretion.     Again, thank you for choosing [facility].  Our hope is that these requests will decrease the amount of time that you wait before being seen by our physicians.       _____________________________________________________________  Should you have questions after your visit to [facility], please contact our office at [phone] between the hours of 8:30 a.m. and 4:30 p.m.  Voicemails left after 4:30 p.m. will not be returned until the following business day.  For prescription refill requests, have your pharmacy contact our office.    Fecal Occult Blood Test This is a test done on a stool specimen to screen for gastrointestinal bleeding, which may be an indicator of colon cancer Is is usually done as part of a routine examination, annually, after age 7 or as directed by your caregiver. The fecal occult blood test (FOBT) checks for blood in your stool. Normally, there will  not be enough blood lost through the gastrointestinal tract to turn an FOBT positive or for you to notice it visually in the form of bloody or dark, tarry stools. Any significant amount of blood being passed should be investigated.  A positive FOBT will tell your caregiver that you have bleeding occurring somewhere in your gastrointestinal tract. This blood loss could be due to ulcers, diverticulosis, bleeding polyps, inflammatory bowel disease, hemorrhoids, from swallowed blood due to bleeding gums or nosebleeds, or it could be due to benign or cancerous tumors. Anything that protrudes into the lumen (the empty space in the intestine), like a polyp or tumor, and is rubbed against by the fecal waste as it passes through has the potential to eventually bleed intermittently. Often this small amount of blood is the first, and sometimes the only, symptom of early colon cancer, making the FOBT a valuable screening tool. PREPARATION FOR TEST  You should not eat red meat within three days before testing. Other substances that could cause a false positive test result include fish, turnips, horseradish, and drugs such as colchicines and oxidizing drugs (for example, iodine and boric acid). Be sure to carefully follow your caregiver's instructions. With FOBT, your caregiver or laboratory will give you one or more test "cards." You collect a separate sample from three different stools, usually on consecutive days. Each stool sample should be collected into a clean container and should not be contaminated with urine or water. The slide is labeled with your name and the date; then, with an applicator stick, you  apply a thin smear of stool onto each filter paper square/window contained on the card. Allow the filter paper to dry. Once it is dry, it is stable. Usually you will collect all of the consecutive samples, and then return all of them to your caregiver or laboratory at the same time, sometimes by mailing them. There  are also over the counter tests which are dropped in your toilet. NORMAL FINDINGS   No occult blood within the stool.  The FOBT test is normally negative. A positive indicates either blood in the stool or an interfering substance. Multiple samples are done to: 1) catch intermittent bleeding; and 2) help rule out false positives. Ranges for normal findings may vary among different laboratories and hospitals. You should always check with your doctor after having lab work or other tests done to discuss the meaning of your test results and whether your values are considered within normal limits. MEANING OF TEST  Your caregiver will go over the test results with you and discuss the importance and meaning of your results, as well as treatment options and the need for additional tests if necessary. OBTAINING THE TEST RESULTS  It is your responsibility to obtain your test results. Ask the lab or department performing the test when and how you will get your results. Document Released: 01/14/2004 Document Revised: 03/13/2011 Document Reviewed: 11/29/2007 [facility] Patient Information 2015 [place], [place]. This information is not intended to replace advice given to you by your health care provider. Make sure you discuss any questions you have with your health care provider.

## 2014-07-18 LAB — CEA: CEA: 191.4 ng/mL — ABNORMAL HIGH (ref 0.0–4.7)

## 2014-07-20 ENCOUNTER — Other Ambulatory Visit (HOSPITAL_COMMUNITY): Payer: Self-pay | Admitting: Oncology

## 2014-07-20 ENCOUNTER — Telehealth (HOSPITAL_COMMUNITY): Payer: Self-pay | Admitting: Emergency Medicine

## 2014-07-20 DIAGNOSIS — C189 Malignant neoplasm of colon, unspecified: Secondary | ICD-10-CM

## 2014-07-20 DIAGNOSIS — E611 Iron deficiency: Secondary | ICD-10-CM

## 2014-07-20 NOTE — Telephone Encounter (Signed)
Notified pt to continue treatment and that we will recheck CEA at next appt

## 2014-07-20 NOTE — Telephone Encounter (Signed)
-----   Message from Baird Cancer, PA-C sent at 07/20/2014  4:02 PM EDT ----- Continue treatment.  Will recheck CEA in near future.  Upcoming appointment with Dr. Mamie Nick later this month.

## 2014-07-22 ENCOUNTER — Other Ambulatory Visit (HOSPITAL_COMMUNITY): Payer: Self-pay

## 2014-07-22 DIAGNOSIS — Z9889 Other specified postprocedural states: Secondary | ICD-10-CM | POA: Diagnosis not present

## 2014-07-22 DIAGNOSIS — D649 Anemia, unspecified: Secondary | ICD-10-CM

## 2014-07-22 LAB — OCCULT BLOOD X 1 CARD TO LAB, STOOL
Fecal Occult Bld: NEGATIVE
Fecal Occult Bld: NEGATIVE
Fecal Occult Bld: NEGATIVE

## 2014-07-29 ENCOUNTER — Encounter (HOSPITAL_COMMUNITY): Payer: Self-pay | Admitting: Lab

## 2014-07-29 ENCOUNTER — Encounter (HOSPITAL_COMMUNITY): Payer: Self-pay | Admitting: Hematology & Oncology

## 2014-07-29 ENCOUNTER — Encounter (HOSPITAL_BASED_OUTPATIENT_CLINIC_OR_DEPARTMENT_OTHER): Payer: Medicare Other | Admitting: Hematology & Oncology

## 2014-07-29 VITALS — BP 141/76 | HR 93 | Temp 98.2°F | Resp 18 | Wt 144.0 lb

## 2014-07-29 DIAGNOSIS — R110 Nausea: Secondary | ICD-10-CM

## 2014-07-29 DIAGNOSIS — M542 Cervicalgia: Secondary | ICD-10-CM | POA: Diagnosis not present

## 2014-07-29 DIAGNOSIS — M25511 Pain in right shoulder: Secondary | ICD-10-CM | POA: Diagnosis not present

## 2014-07-29 DIAGNOSIS — R109 Unspecified abdominal pain: Secondary | ICD-10-CM

## 2014-07-29 DIAGNOSIS — C189 Malignant neoplasm of colon, unspecified: Secondary | ICD-10-CM

## 2014-07-29 DIAGNOSIS — C787 Secondary malignant neoplasm of liver and intrahepatic bile duct: Secondary | ICD-10-CM

## 2014-07-29 DIAGNOSIS — C19 Malignant neoplasm of rectosigmoid junction: Secondary | ICD-10-CM | POA: Diagnosis not present

## 2014-07-29 DIAGNOSIS — R634 Abnormal weight loss: Secondary | ICD-10-CM

## 2014-07-29 MED ORDER — ONDANSETRON HCL 8 MG PO TABS: 8.0000 mg | ORAL_TABLET | Freq: Three times a day (TID) | ORAL | Status: AC | PRN

## 2014-07-29 MED ORDER — HYDROMORPHONE HCL 4 MG PO TABS: 4.0000 mg | ORAL_TABLET | ORAL | Status: AC | PRN

## 2014-07-29 NOTE — Progress Notes (Signed)
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 That was completed on 04/03/2011 KRAS mutated FOLFIRI + Avastin from 01/16/2012- 04/08/2012 at which time her quality of life was declining and she requested a break from therapy.  Stivarga started 11/20/2013 discontinued 04/2014 Lonsurf  CURRENT THERAPY: [name]  INTERVAL HISTORY: [name] 74 y.o. 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 She is status post RFA ablation initially of single liver lesion in August 2012 followed by 6 cycles of FOLFOX/bevacizumab. That was completed on 04/03/2011. She then had a rising CEA level with recurrent disease in the liver and probably in the lung with small non-hypermetabolic pulmonary nodules. Therefore, she was started on FOLFIRI + Avastin from 01/16/2012- 04/08/2012 at which time her quality of life was declining and she requested a break from therapy. She was eventually started on Stivarga which she tolerated well for approximately 6 months. She has now been changed to [place] secondary to progression.  Her biggest issues revolve around ongoing neck shoulder and right flank pain. She had an MRI of the cervical spine that showed significant cervical spinal disease. I referred to pain clinic she did not hear from them for an appointment. Then at her last visit suggested she may not need to go. She states today that she feels her ongoing problems with nausea related to her pain and pain medications. She does not feel that Lonsurf, her current chemotherapy drug makes her nauseated. She does not take nausea medication routinely.  Her weight is down 6 pounds but she states the persistent nausea makes her not want to eat. Currently she would like to try  to have her neck evaluated prior to considering stopping her current medication for her colon cancer.  She denies significant on's to patient. She denies any vomiting.    Colon adenocarcinoma   11/04/2013 Progression CT CAP and head- Interval progression of disease within the chest, abdomen. Progression of pulmonary metastasis. Increase in size of right hepatic lobe metastasis. Progression of retroperitoneal adenopathy and peritoneal adenopathy  metastasis   11/20/2013 - 05/11/2014 Chemotherapy Stivarga 80 mg daily 21 days on and 7 days off   05/07/2014 Progression CT CAP- The dominant right hepatic lobe mass is mildly increased in size, and there several new hypodense lesions in the liver which probably represent additional metastatic foci.   06/01/2014 -  Chemotherapy Lonsurf    Past Medical History  Diagnosis Date  . Colon cancer     liver ca  . HTN (hypertension)   . Thyroid condition   . Diabetes mellitus   . Shingles   . Acute UTI   . Nausea 02/06/2012  . Port catheter in place 08/15/2012  . Diabetes type 2, controlled 04/13/2012  . Sore throat 02/09/14    has Colon adenocarcinoma; Thyroid condition; Diabetes type 2, controlled; HTN (hypertension), benign; Port catheter in place; Dry mouth; Nausea without vomiting; Trapezius muscle strain; and Reactive depression (situational) on her problem list.     has No Known Allergies.  Ms. Gullickson had no medications administered during this visit.  Past Surgical History  Procedure Laterality Date  . Colectomy      and bil. oopherectomy  . Appendectomy    . Blt    . Resection liver  partial / total w/ radiofrequency ablation    . Port-a-cath removal      has new port now  . Portacath placement  09/2010  . Oophorectomy Bilateral 2007  . Cataract surgery Right 01/10/13  . Cataract surgery Left 01/24/13    Denies any headaches, dizziness, double vision, fevers, chills, night sweats, vomiting, diarrhea, constipation, chest pain, heart  palpitations, shortness of breath, blood in stool, black tarry stool, urinary pain, urinary burning, urinary frequency, hematuria. Denies abdominal pain. Positive for nausea, weight loss      Alleviated with a bit of buttermilk. Positive for neck pain.       Right neck/top right of shoulder. Experiences this pain daily even with pain medications.  14 point review of systems was performed and is negative except as detailed under history of present illness and above   PHYSICAL EXAMINATION  ECOG PERFORMANCE STATUS: 1 - Symptomatic but completely ambulatory  Filed Vitals:   07/29/14 0820  BP: 141/76  Pulse: 93  Temp: 98.2 F (36.8 C)  Resp: 18    GENERAL:alert, no distress, well nourished, well developed, comfortable, cooperative and smiling SKIN: skin color, texture, turgor are normal, no rashes or significant lesions HEAD: Normocephalic, No masses, lesions, tenderness or abnormalities EYES: normal, PERRLA, EOMI, Conjunctiva are pink and non-injected EARS: External ears normal OROPHARYNX:lips, buccal mucosa, and tongue normal and mucous membranes are moist  NECK: supple, no adenopathy, thyroid normal size, non-tender, without nodularity, no stridor, non-tender, trachea midline     Right neck tender on palpation.  LYMPH:  no palpable lymphadenopathy BREAST:not examined LUNGS: clear to auscultation and percussion HEART: regular rate & rhythm, no murmurs, no gallops, S1 normal and S2 normal ABDOMEN:abdomen soft, non-tender and normal bowel sounds BACK: Back symmetric, no curvature., No CVA tenderness EXTREMITIES:less then 2 second capillary refill, no joint deformities, effusion, or inflammation, no edema, no skin discoloration, no clubbing, no cyanosis  NEURO: alert & oriented x 3 with fluent speech, no focal motor/sensory deficits, gait normal   LABORATORY DATA: ASSESSMENT AND PLAN:  Stage IV colorectal cancer with liver metastases Previous treatment with FOLFOX and Avastin,  FOLFIRI K-ras mutated R sided neck pain Weight loss Persistent nausea  She will continue with her new medication, Lonsurf.   Since she believes her pain medication is making her nauseated we will rotate her opiod from hydrocodone to Dilaudid. I am going to refer her to Dr. Merlene Laughter for evaluation of her neck and ongoing pain.  I have requested that she take Zofran on a schedule as well.  She is coming in Friday for an iron infusion and we will recheck laboratory studies at that time. Last hemoglobin was 8.8 but serum ferritin was low. CEA was initially declining if we see a change in that trend we will consider repeat imaging. Given that the patient does not feel able to tolerate any other chemotherapy options we will discuss how to proceed at that point. We have talked about hospice referral in the past and may need to address that again.  All questions were answered. The patient knows to call the clinic with any problems, questions or concerns. We can certainly see the patient much sooner if necessary. Note was electronically signed  This document serves as a record of services personally performed by Ancil Linsey, MD. It was created on her behalf by Arlyce Harman, a trained medical scribe. The creation of this record is based on the scribe's personal observations and the provider's statements to them. This document  has been checked and approved by the attending provider.  I have reviewed the above documentation for accuracy and completeness, and I agree with the above.  Ancil Linsey, MD

## 2014-07-29 NOTE — Patient Instructions (Signed)
[place] at [facility] Discharge Instructions  RECOMMENDATIONS MADE BY THE CONSULTANT AND ANY TEST RESULTS WILL BE SENT TO YOUR REFERRING PHYSICIAN.  Pain medication changed to Dilaudid to see if nausea is related to pain medication. Paelyn Smick prescription given for Zofran, take around the clock. We will make a referral to Dr.[place] (neurologist) to evaluate neck pain. Return as scheduled on Friday for iron infusion and lab work. MD appointment in 2 weeks for follow up.  Thank you for choosing [place] at [facility] to provide your oncology and hematology care.  To afford each patient quality time with our provider, please arrive at least 15 minutes before your scheduled appointment time.    You need to re-schedule your appointment should you arrive 10 or more minutes late.  We strive to give you quality time with our providers, and arriving late affects you and other patients whose appointments are after yours.  Also, if you no show three or more times for appointments you may be dismissed from the clinic at the providers discretion.     Again, thank you for choosing [facility].  Our hope is that these requests will decrease the amount of time that you wait before being seen by our physicians.       _____________________________________________________________  Should you have questions after your visit to [facility], please contact our office at [phone] between the hours of 8:30 a.m. and 4:30 p.m.  Voicemails left after 4:30 p.m. will not be returned until the following business day.  For prescription refill requests, have your pharmacy contact our office.

## 2014-07-29 NOTE — Progress Notes (Signed)
Referral sent to Dr Merlene Laughter.  Records faxed on 7/27

## 2014-07-31 ENCOUNTER — Encounter (HOSPITAL_COMMUNITY): Payer: Self-pay | Admitting: Hematology & Oncology

## 2014-07-31 ENCOUNTER — Encounter (HOSPITAL_COMMUNITY): Payer: Medicare Other | Admitting: Hematology & Oncology

## 2014-07-31 ENCOUNTER — Encounter (HOSPITAL_COMMUNITY): Payer: Medicare Other

## 2014-07-31 ENCOUNTER — Encounter (HOSPITAL_BASED_OUTPATIENT_CLINIC_OR_DEPARTMENT_OTHER): Payer: Medicare Other

## 2014-07-31 VITALS — BP 131/60 | HR 55 | Temp 97.7°F | Resp 16

## 2014-07-31 DIAGNOSIS — C189 Malignant neoplasm of colon, unspecified: Secondary | ICD-10-CM

## 2014-07-31 DIAGNOSIS — E611 Iron deficiency: Secondary | ICD-10-CM | POA: Diagnosis not present

## 2014-07-31 DIAGNOSIS — Z9889 Other specified postprocedural states: Secondary | ICD-10-CM | POA: Diagnosis not present

## 2014-07-31 DIAGNOSIS — R110 Nausea: Secondary | ICD-10-CM

## 2014-07-31 LAB — COMPREHENSIVE METABOLIC PANEL
ALT: 16 U/L (ref 14–54)
ANION GAP: 9 (ref 5–15)
AST: 17 U/L (ref 15–41)
Albumin: 3.7 g/dL (ref 3.5–5.0)
Alkaline Phosphatase: 57 U/L (ref 38–126)
BUN: 14 mg/dL (ref 6–20)
CO2: 25 mmol/L (ref 22–32)
Calcium: 9.1 mg/dL (ref 8.9–10.3)
Chloride: 100 mmol/L — ABNORMAL LOW (ref 101–111)
Creatinine, Ser: 0.63 mg/dL (ref 0.44–1.00)
GFR calc Af Amer: >60 mL/min (ref >60)
GFR calc non Af Amer: >60 mL/min (ref >60)
GLUCOSE: 198 mg/dL — AB (ref 65–99)
Potassium: 4.3 mmol/L (ref 3.5–5.1)
Sodium: 134 mmol/L — ABNORMAL LOW (ref 135–145)
TOTAL PROTEIN: 6.8 g/dL (ref 6.5–8.1)
Total Bilirubin: 0.4 mg/dL (ref 0.3–1.2)

## 2014-07-31 LAB — CBC WITH DIFFERENTIAL/PLATELET
BASOS ABS: 0.1 10*3/uL (ref 0.0–0.1)
Basophils Relative: 1 % (ref 0–1)
EOS PCT: 1 % (ref 0–5)
Eosinophils Absolute: 0.1 10*3/uL (ref 0.0–0.7)
HEMATOCRIT: 32 % — AB (ref 36.0–46.0)
Hemoglobin: 10.3 g/dL — ABNORMAL LOW (ref 12.0–15.0)
LYMPHS ABS: 1.4 10*3/uL (ref 0.7–4.0)
LYMPHS PCT: 14 % (ref 12–46)
MCH: 33.6 pg (ref 26.0–34.0)
MCHC: 32.2 g/dL (ref 30.0–36.0)
MCV: 104.2 fL — AB (ref 78.0–100.0)
MONO ABS: 0.5 10*3/uL (ref 0.1–1.0)
Monocytes Relative: 6 % (ref 3–12)
NEUTROS PCT: 78 % — AB (ref 43–77)
Neutro Abs: 7.6 10*3/uL (ref 1.7–7.7)
PLATELETS: 344 10*3/uL (ref 150–400)
RBC: 3.07 MIL/uL — ABNORMAL LOW (ref 3.87–5.11)
RDW: 15.4 % (ref 11.5–15.5)
WBC: 9.6 10*3/uL (ref 4.0–10.5)

## 2014-07-31 MED ORDER — SODIUM CHLORIDE 0.9 % IV SOLN
510.0000 mg | Freq: Once | INTRAVENOUS | Status: AC
  Administered 2014-07-31: 510 mg via INTRAVENOUS
  Filled 2014-07-31: qty 17

## 2014-07-31 MED ORDER — SODIUM CHLORIDE 0.9 % IV SOLN
INTRAVENOUS | Status: DC
  Administered 2014-07-31: 10:00:00 via INTRAVENOUS

## 2014-07-31 MED ORDER — HEPARIN SOD (PORK) LOCK FLUSH 100 UNIT/ML IV SOLN
500.0000 [IU] | Freq: Once | INTRAVENOUS | Status: AC
  Administered 2014-07-31: 500 [IU] via INTRAVENOUS

## 2014-07-31 MED ORDER — HEPARIN SOD (PORK) LOCK FLUSH 100 UNIT/ML IV SOLN
INTRAVENOUS | Status: AC
  Filled 2014-07-31: qty 5

## 2014-07-31 NOTE — Progress Notes (Signed)
Patient tolerated infusion well.  VSS 30 minutes post infusion with no s/s reaction to medication.

## 2014-08-01 LAB — CEA: CEA: 234 ng/mL — AB (ref 0.0–4.7)

## 2014-08-13 ENCOUNTER — Encounter (HOSPITAL_COMMUNITY): Payer: Self-pay | Admitting: Oncology

## 2014-08-13 ENCOUNTER — Encounter (HOSPITAL_COMMUNITY): Payer: Medicare Other | Attending: Internal Medicine | Admitting: Oncology

## 2014-08-13 VITALS — BP 132/67 | HR 72 | Temp 97.8°F | Resp 18 | Wt 148.4 lb

## 2014-08-13 DIAGNOSIS — C189 Malignant neoplasm of colon, unspecified: Secondary | ICD-10-CM | POA: Diagnosis not present

## 2014-08-13 DIAGNOSIS — Z9889 Other specified postprocedural states: Secondary | ICD-10-CM | POA: Insufficient documentation

## 2014-08-13 NOTE — Assessment & Plan Note (Addendum)
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  Progression of disease noted on salvage Stivarga leading to a change in therapy to [place].  She started Lonsurf on 06/01/2014.  Neck pain secondary to facet inflammation and disc protrusion.  Currently on Dilaudid for pain control.  She received Feraheme 510 mg on 07/31/2014 for her Hgb dropping and ferritin being low-normal.  CEA has been climbing over the past 3 months.  She remains asymptomatic, but with a climbing CEA, failure treatment is occurring.  She is a perfect candidate for Hospice given lack of additional treatment options.  Her performance status is intact and therefore, she should be able to gain significant benefit from Hospice and their services.  She is agreeable to referral.  Return PRN.

## 2014-08-13 NOTE — Patient Instructions (Signed)
[name] at [facility] Discharge Instructions  RECOMMENDATIONS MADE BY THE CONSULTANT AND ANY TEST RESULTS WILL BE SENT TO YOUR REFERRING PHYSICIAN.  Exam completed by Kirby Crigler today Hospice referral They will contact you Return if you need [place]. Please call the clinic if you have any questions or concerns  Thank you for choosing [place] at [facility] to provide your oncology and hematology care.  To afford each patient quality time with our provider, please arrive at least 15 minutes before your scheduled appointment time.    You need to re-schedule your appointment should you arrive 10 or more minutes late.  We strive to give you quality time with our providers, and arriving late affects you and other patients whose appointments are after yours.  Also, if you no show three or more times for appointments you may be dismissed from the clinic at the providers discretion.     Again, thank you for choosing [facility].  Our hope is that these requests will decrease the amount of time that you wait before being seen by our physicians.       _____________________________________________________________  Should you have questions after your visit to [facility], please contact our office at [phone] between the hours of 8:30 a.m. and 4:30 p.m.  Voicemails left after 4:30 p.m. will not be returned until the following business day.  For prescription refill requests, have your pharmacy contact our office.

## 2014-08-13 NOTE — Progress Notes (Signed)
Anita Mustache, MD [place] [id number]  Colon adenocarcinoma  CURRENT THERAPY: Lonsurf  INTERVAL HISTORY: [name] 74 y.o. 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  Progression of disease noted on salvage Stivarga leading to a change in therapy to [facility].    Colon adenocarcinoma   11/04/2013 Progression CT CAP and head- Interval progression of disease within the chest, abdomen. Progression of pulmonary metastasis. Increase in size of right hepatic lobe metastasis. Progression of retroperitoneal adenopathy and peritoneal adenopathy  metastasis   11/20/2013 - 05/11/2014 Chemotherapy Stivarga 80 mg daily 21 days on and 7 days off   05/07/2014 Progression CT CAP- The dominant right hepatic lobe mass is mildly increased in size, and there several new hypodense lesions in the liver which probably represent additional metastatic foci.   06/01/2014 - 08/13/2014 Chemotherapy Lonsurf   08/13/2014 Progression Continued increase in CEA.    I personally reviewed and went over laboratory results with the patient.  The results are noted within this dictation.  CEA has been trending upwards.  I reviewed [name] labs with [name] and biochemically, she is failing therapy.  As a result, I have recommended she stop Lonsurf due to failure.  I broached the topic of Hospice.  We had a long conversation regarding Hospice.  Patient education was given regarding Hospice and the services they provide.  The patient understands that studies report that early enrollment in Hospice actually allows the patient to live longer compared to those who enroll in Hospice nearer to end of life.  Hospice will allow the patient to stay at home at end of life or go to a facility for end of life care.  At this point, the patient would like to remain at home.  Hospice provides the patient with a team of providers to help with care including physicians, nurses, aids,  chaplains, and social workers.  Hospice's goal is to keep patients symptom free from their terminal diagnosis.  The patient is certainly Hospice appropriate with a life expectancy of less than 6 months.      Past Medical History  Diagnosis Date  . Colon cancer     liver ca  . HTN (hypertension)   . Thyroid condition   . Diabetes mellitus   . Shingles   . Acute UTI   . Nausea 02/06/2012  . Port catheter in place 08/15/2012  . Diabetes type 2, controlled 04/13/2012  . Sore throat 02/09/14    has Colon adenocarcinoma; Thyroid condition; Diabetes type 2, controlled; HTN (hypertension), benign; Port catheter in place; Dry mouth; Nausea without vomiting; Trapezius muscle strain; and Reactive depression (situational) on [name] problem list.     has No Known Allergies.  Current Outpatient Prescriptions on File Prior to Visit  Medication Sig Dispense Refill  . amLODipine (NORVASC) 5 MG tablet Take 5 mg by mouth daily.    . dorzolamide-timolol (COSOPT) 22.3-6.8 MG/ML ophthalmic solution Place 1 drop into both eyes 2 (two) times daily.     Marland Kitchen glipiZIDE (GLUCOTROL XL) 5 MG 24 hr tablet Take 5 mg by mouth daily.  0  . HYDROmorphone (DILAUDID) 4 MG tablet Take 1 tablet (4 mg total) by mouth every 2 (two) hours as needed for severe pain. 60 tablet 0  . ibuprofen (ADVIL,MOTRIN) 200 MG tablet Take 200 mg by mouth every 6 (six) hours as needed for mild pain.     Marland Kitchen levothyroxine (SYNTHROID, LEVOTHROID)  100 MCG tablet Take 100 mcg by mouth daily before breakfast.    . LORazepam (ATIVAN) 0.5 MG tablet Take 2 tablets (1 mg total) by mouth every 8 (eight) hours as needed for anxiety or sleep. 60 tablet 5  . metFORMIN (GLUCOPHAGE-XR) 500 MG 24 hr tablet Take 500 mg by mouth 2 (two) times daily.  1  . methocarbamol (ROBAXIN) 500 MG tablet Take 3 tablets three times daily as needed for muscle spasm 42 tablet 6  . omeprazole (PRILOSEC) 40 MG capsule Take 1 capsule (40 mg total) by mouth daily. 30 capsule 3  .  ondansetron (ZOFRAN) 8 MG tablet Take 1 tablet (8 mg total) by mouth every 8 (eight) hours as needed for nausea or vomiting. 45 tablet 5  . tiZANidine (ZANAFLEX) 4 MG tablet Take 4 mg by mouth once.   0  . traMADol (ULTRAM) 50 MG tablet Take 1 tablet (50 mg total) by mouth every 6 (six) hours as needed. 20 tablet 0  . trifluridine-tipiracil (LONSURF) 20-8.19 MG tablet Take 3 tablets (60 mg of trifluridine total) by mouth 2 (two) times daily after a meal. 1 hr after AM & PM meals on days 1-5, 8-12. Repeat every 28day 60 tablet 1   No current facility-administered medications on file prior to visit.    Past Surgical History  Procedure Laterality Date  . Colectomy      and bil. oopherectomy  . Appendectomy    . Blt    . Resection liver partial / total w/ radiofrequency ablation    . Port-a-cath removal      has new port now  . Portacath placement  09/2010  . Oophorectomy Bilateral 2007  . Cataract surgery Right 01/10/13  . Cataract surgery Left 01/24/13    Denies any headaches, dizziness, double vision, fevers, chills, night sweats, diarrhea, constipation, chest pain, heart palpitations, shortness of breath, blood in stool, black tarry stool, urinary pain, urinary burning, urinary frequency, hematuria.   PHYSICAL EXAMINATION  ECOG PERFORMANCE STATUS: 2 - Symptomatic, <50% confined to bed  Filed Vitals:   08/13/14 0900  BP: 132/67  Pulse: 72  Temp: 97.8 F (36.6 C)  Resp: 18    GENERAL:alert, NED. SKIN: skin color, texture, turgor are normal, no rashes or significant lesions HEAD: Normocephalic, No masses, lesions, tenderness or abnormalities EYES: normal, PERRLA, EOMI, Conjunctiva are pink and non-injected EARS: External ears normal OROPHARYNX:lips, buccal mucosa, and tongue normal and mucous membranes are moist  NECK: supple, trachea midline LYMPH:  no palpable lymphadenopathy BREAST:not examined LUNGS: clear to auscultation  HEART: regular rate & rhythm without  murmur ABDOMEN:abdomen soft, nontender BACK: Back symmetric, no curvature. EXTREMITIES:less then 2 second capillary refill, no skin discoloration, no cyanosis  NEURO: alert & oriented x 3 with fluent speech, no focal motor/sensory deficits   LABORATORY DATA: CBC    Component Value Date/Time   WBC 9.6 07/31/2014 0934   RBC 3.07* 07/31/2014 0934   HGB 10.3* 07/31/2014 0934   HCT 32.0* 07/31/2014 0934   PLT 344 07/31/2014 0934   MCV 104.2* 07/31/2014 0934   MCH 33.6 07/31/2014 0934   MCHC 32.2 07/31/2014 0934   RDW 15.4 07/31/2014 0934   LYMPHSABS 1.4 07/31/2014 0934   MONOABS 0.5 07/31/2014 0934   EOSABS 0.1 07/31/2014 0934   BASOSABS 0.1 07/31/2014 0934      Chemistry      Component Value Date/Time   NA 134* 07/31/2014 0934   K 4.3 07/31/2014 0934   CL 100* 07/31/2014  0934   CO2 25 07/31/2014 0934   BUN 14 07/31/2014 0934   CREATININE 0.63 07/31/2014 0934      Component Value Date/Time   CALCIUM 9.1 07/31/2014 0934   ALKPHOS 57 07/31/2014 0934   AST 17 07/31/2014 0934   ALT 16 07/31/2014 0934   BILITOT 0.4 07/31/2014 0934      Lab Results  Component Value Date   CEA 234.0* 07/31/2014    RADIOGRAPHIC STUDIES:  No results found.    ASSESSMENT AND PLAN:  Colon adenocarcinoma Stage IV CRC (KRAS +) with hepatic involvement.  Progression of disease noted on salvage Stivarga leading to a change in therapy to [facility].  She started Lonsurf on 06/01/2014.  Neck pain secondary to facet inflammation and disc protrusion.  Currently on Dilaudid for pain control.  She received Feraheme 510 mg on 07/31/2014 for [name] Hgb dropping and ferritin being low-normal.  CEA has been climbing over the past 3 months.  She remains asymptomatic, but with a climbing CEA, failure treatment is occurring.  She is a perfect candidate for Hospice given lack of additional treatment options.  [name] performance status is intact and therefore, she should be able to gain significant benefit from  Hospice and their services.  She is agreeable to referral.  Return PRN.    THERAPY PLAN:  Refer to Hospice.  All questions were answered. The patient knows to call the clinic with any problems, questions or concerns. We can certainly see the patient much sooner if necessary.  Patient and plan discussed with Dr. Ancil Linsey and she is in agreement with the aforementioned.   This note is electronically signed by: Doy Mince 08/13/2014 11:48 AM

## 2014-08-28 ENCOUNTER — Encounter (HOSPITAL_COMMUNITY): Payer: Medicare Other

## 2014-09-11 ENCOUNTER — Encounter (HOSPITAL_COMMUNITY): Payer: Medicare Other

## 2014-09-14 NOTE — Progress Notes (Signed)
This encounter was created in error - please disregard.

## 2014-09-25 ENCOUNTER — Encounter (HOSPITAL_COMMUNITY): Payer: Medicare Other

## 2014-10-23 ENCOUNTER — Encounter (HOSPITAL_COMMUNITY): Payer: Medicare Other

## 2014-11-20 ENCOUNTER — Encounter (HOSPITAL_COMMUNITY): Payer: Medicare Other

## 2015-02-03 DEATH — deceased

## 2015-04-08 ENCOUNTER — Encounter: Payer: Self-pay | Admitting: Gastroenterology
